# Patient Record
Sex: Female | Born: 1945 | Race: White | Hispanic: No | Marital: Single
Health system: Southern US, Community
[De-identification: ages and names within clinical notes are randomized; demographics above are authoritative.]

## PROBLEM LIST (undated history)

## (undated) DIAGNOSIS — J690 Pneumonitis due to inhalation of food and vomit: Secondary | ICD-10-CM

## (undated) DIAGNOSIS — J9621 Acute and chronic respiratory failure with hypoxia: Secondary | ICD-10-CM

## (undated) DIAGNOSIS — J449 Chronic obstructive pulmonary disease, unspecified: Secondary | ICD-10-CM

## (undated) DIAGNOSIS — I5032 Chronic diastolic (congestive) heart failure: Secondary | ICD-10-CM

## (undated) DIAGNOSIS — I4891 Unspecified atrial fibrillation: Secondary | ICD-10-CM

---

## 2018-10-17 ENCOUNTER — Inpatient Hospital Stay
Admit: 2018-10-17 | Discharge: 2018-11-27 | Disposition: A | Discharge status: Skilled Nursing Facility | Payer: Medicare Other | Source: Other Acute Inpatient Hospital | Attending: Internal Medicine | Admitting: Internal Medicine

## 2018-10-17 DIAGNOSIS — Z931 Gastrostomy status: Secondary | ICD-10-CM

## 2018-10-17 DIAGNOSIS — I5032 Chronic diastolic (congestive) heart failure: Secondary | ICD-10-CM | POA: Diagnosis present

## 2018-10-17 DIAGNOSIS — J9690 Respiratory failure, unspecified, unspecified whether with hypoxia or hypercapnia: Secondary | ICD-10-CM

## 2018-10-17 DIAGNOSIS — J9621 Acute and chronic respiratory failure with hypoxia: Secondary | ICD-10-CM | POA: Diagnosis present

## 2018-10-17 DIAGNOSIS — Z09 Encounter for follow-up examination after completed treatment for conditions other than malignant neoplasm: Secondary | ICD-10-CM

## 2018-10-17 DIAGNOSIS — Z4659 Encounter for fitting and adjustment of other gastrointestinal appliance and device: Secondary | ICD-10-CM

## 2018-10-17 DIAGNOSIS — Z01818 Encounter for other preprocedural examination: Secondary | ICD-10-CM

## 2018-10-17 DIAGNOSIS — Z789 Other specified health status: Secondary | ICD-10-CM

## 2018-10-17 DIAGNOSIS — R092 Respiratory arrest: Secondary | ICD-10-CM

## 2018-10-17 DIAGNOSIS — I4891 Unspecified atrial fibrillation: Secondary | ICD-10-CM | POA: Diagnosis present

## 2018-10-17 DIAGNOSIS — Z9889 Other specified postprocedural states: Secondary | ICD-10-CM

## 2018-10-17 DIAGNOSIS — J189 Pneumonia, unspecified organism: Secondary | ICD-10-CM

## 2018-10-17 DIAGNOSIS — J449 Chronic obstructive pulmonary disease, unspecified: Secondary | ICD-10-CM | POA: Diagnosis present

## 2018-10-17 DIAGNOSIS — J690 Pneumonitis due to inhalation of food and vomit: Secondary | ICD-10-CM | POA: Diagnosis present

## 2018-10-17 HISTORY — DX: Pneumonitis due to inhalation of food and vomit: J69.0

## 2018-10-17 HISTORY — DX: Acute and chronic respiratory failure with hypoxia: J96.21

## 2018-10-17 HISTORY — DX: Chronic diastolic (congestive) heart failure: I50.32

## 2018-10-17 HISTORY — DX: Unspecified atrial fibrillation: I48.91

## 2018-10-17 HISTORY — DX: Chronic obstructive pulmonary disease, unspecified: J44.9

## 2018-10-18 ENCOUNTER — Other Ambulatory Visit (HOSPITAL_COMMUNITY): Payer: Medicare Other

## 2018-10-18 LAB — CBC WITH DIFFERENTIAL/PLATELET
Abs Immature Granulocytes: 0.15 10*3/uL — ABNORMAL HIGH (ref 0.00–0.07)
Basophils Absolute: 0 10*3/uL (ref 0.0–0.1)
Basophils Relative: 0 %
Eosinophils Absolute: 0.3 10*3/uL (ref 0.0–0.5)
Eosinophils Relative: 2 %
HCT: 29.5 % — ABNORMAL LOW (ref 36.0–46.0)
Hemoglobin: 8.1 g/dL — ABNORMAL LOW (ref 12.0–15.0)
Immature Granulocytes: 1 %
Lymphocytes Relative: 11 %
Lymphs Abs: 1.3 10*3/uL (ref 0.7–4.0)
MCH: 24 pg — ABNORMAL LOW (ref 26.0–34.0)
MCHC: 27.5 g/dL — ABNORMAL LOW (ref 30.0–36.0)
MCV: 87.5 fL (ref 80.0–100.0)
Monocytes Absolute: 0.7 10*3/uL (ref 0.1–1.0)
Monocytes Relative: 5 %
Neutro Abs: 9.9 10*3/uL — ABNORMAL HIGH (ref 1.7–7.7)
Neutrophils Relative %: 81 %
Platelets: 444 10*3/uL — ABNORMAL HIGH (ref 150–400)
RBC: 3.37 MIL/uL — ABNORMAL LOW (ref 3.87–5.11)
RDW: 16.4 % — ABNORMAL HIGH (ref 11.5–15.5)
WBC: 12.3 10*3/uL — ABNORMAL HIGH (ref 4.0–10.5)
nRBC: 0 % (ref 0.0–0.2)

## 2018-10-18 LAB — COMPREHENSIVE METABOLIC PANEL
ALT: 11 U/L (ref 0–44)
AST: 18 U/L (ref 15–41)
Albumin: 2 g/dL — ABNORMAL LOW (ref 3.5–5.0)
Alkaline Phosphatase: 97 U/L (ref 38–126)
Anion gap: 8 (ref 5–15)
BUN: 10 mg/dL (ref 8–23)
CO2: 38 mmol/L — ABNORMAL HIGH (ref 22–32)
Calcium: 8.9 mg/dL (ref 8.9–10.3)
Chloride: 96 mmol/L — ABNORMAL LOW (ref 98–111)
Creatinine, Ser: 0.85 mg/dL (ref 0.44–1.00)
GFR calc Af Amer: >60 mL/min (ref >60)
GFR calc non Af Amer: >60 mL/min (ref >60)
Glucose, Bld: 135 mg/dL — ABNORMAL HIGH (ref 70–99)
Potassium: 3.7 mmol/L (ref 3.5–5.1)
Sodium: 142 mmol/L (ref 135–145)
Total Bilirubin: 0.6 mg/dL (ref 0.3–1.2)
Total Protein: 6.3 g/dL — ABNORMAL LOW (ref 6.5–8.1)

## 2018-10-18 LAB — BASIC METABOLIC PANEL
Anion gap: 8 (ref 5–15)
BUN: 12 mg/dL (ref 8–23)
CO2: 37 mmol/L — ABNORMAL HIGH (ref 22–32)
Calcium: 8.9 mg/dL (ref 8.9–10.3)
Chloride: 96 mmol/L — ABNORMAL LOW (ref 98–111)
Creatinine, Ser: 0.75 mg/dL (ref 0.44–1.00)
GFR calc Af Amer: >60 mL/min (ref >60)
GFR calc non Af Amer: >60 mL/min (ref >60)
Glucose, Bld: 135 mg/dL — ABNORMAL HIGH (ref 70–99)
Potassium: 3.4 mmol/L — ABNORMAL LOW (ref 3.5–5.1)
Sodium: 141 mmol/L (ref 135–145)

## 2018-10-18 LAB — BLOOD GAS, ARTERIAL
Acid-Base Excess: 11.1 mmol/L — ABNORMAL HIGH (ref 0.0–2.0)
Acid-Base Excess: 12.4 mmol/L — ABNORMAL HIGH (ref 0.0–2.0)
Bicarbonate: 37.3 mmol/L — ABNORMAL HIGH (ref 20.0–28.0)
Bicarbonate: 38.5 mmol/L — ABNORMAL HIGH (ref 20.0–28.0)
Delivery systems: POSITIVE
Delivery systems: POSITIVE
Expiratory PAP: 5
Expiratory PAP: 8
FIO2: 60
FIO2: 60
Inspiratory PAP: 14
Inspiratory PAP: 22
O2 Saturation: 95.1 %
O2 Saturation: 96.6 %
Patient temperature: 96.8
Patient temperature: 97.1
RATE: 16 resp/min
pCO2 arterial: 69.2 mmHg (ref 32.0–48.0)
pCO2 arterial: 70.1 mmHg (ref 32.0–48.0)
pH, Arterial: 7.34 — ABNORMAL LOW (ref 7.350–7.450)
pH, Arterial: 7.358 (ref 7.350–7.450)
pO2, Arterial: 71.6 mmHg — ABNORMAL LOW (ref 83.0–108.0)
pO2, Arterial: 89.4 mmHg (ref 83.0–108.0)

## 2018-10-18 LAB — URINALYSIS, ROUTINE W REFLEX MICROSCOPIC
Bilirubin Urine: NEGATIVE
Glucose, UA: NEGATIVE mg/dL
Hgb urine dipstick: NEGATIVE
Ketones, ur: 5 mg/dL — AB
Leukocytes,Ua: NEGATIVE
Nitrite: NEGATIVE
Protein, ur: 100 mg/dL — AB
Specific Gravity, Urine: 1.017 (ref 1.005–1.030)
pH: 6 (ref 5.0–8.0)

## 2018-10-18 LAB — PROTIME-INR
INR: 1.1 (ref 0.8–1.2)
Prothrombin Time: 14.2 seconds (ref 11.4–15.2)

## 2018-10-18 LAB — MAGNESIUM: Magnesium: 1.7 mg/dL (ref 1.7–2.4)

## 2018-10-19 ENCOUNTER — Other Ambulatory Visit (HOSPITAL_COMMUNITY): Payer: Medicare Other

## 2018-10-19 LAB — BLOOD GAS, ARTERIAL
Acid-Base Excess: 12.4 mmol/L — ABNORMAL HIGH (ref 0.0–2.0)
Bicarbonate: 38.2 mmol/L — ABNORMAL HIGH (ref 20.0–28.0)
Delivery systems: POSITIVE
Expiratory PAP: 8
FIO2: 60
Inspiratory PAP: 18
O2 Saturation: 93.2 %
Patient temperature: 98.6
pCO2 arterial: 69.5 mmHg (ref 32.0–48.0)
pH, Arterial: 7.359 (ref 7.350–7.450)
pO2, Arterial: 68.8 mmHg — ABNORMAL LOW (ref 83.0–108.0)

## 2018-10-19 LAB — MAGNESIUM: Magnesium: 2.1 mg/dL (ref 1.7–2.4)

## 2018-10-19 LAB — POTASSIUM: Potassium: 3.5 mmol/L (ref 3.5–5.1)

## 2018-10-20 ENCOUNTER — Other Ambulatory Visit (HOSPITAL_COMMUNITY): Payer: Medicare Other

## 2018-10-20 DIAGNOSIS — J9621 Acute and chronic respiratory failure with hypoxia: Secondary | ICD-10-CM

## 2018-10-20 DIAGNOSIS — I4891 Unspecified atrial fibrillation: Secondary | ICD-10-CM

## 2018-10-20 DIAGNOSIS — J690 Pneumonitis due to inhalation of food and vomit: Secondary | ICD-10-CM

## 2018-10-20 DIAGNOSIS — I5032 Chronic diastolic (congestive) heart failure: Secondary | ICD-10-CM

## 2018-10-20 DIAGNOSIS — J449 Chronic obstructive pulmonary disease, unspecified: Secondary | ICD-10-CM

## 2018-10-20 LAB — BLOOD GAS, ARTERIAL
Acid-Base Excess: 17.5 mmol/L — ABNORMAL HIGH (ref 0.0–2.0)
Bicarbonate: 43.8 mmol/L — ABNORMAL HIGH (ref 20.0–28.0)
Delivery systems: POSITIVE
Expiratory PAP: 8
FIO2: 70
Inspiratory PAP: 18
O2 Saturation: 95.6 %
Patient temperature: 98.6
pCO2 arterial: 78.1 mmHg (ref 32.0–48.0)
pH, Arterial: 7.367 (ref 7.350–7.450)
pO2, Arterial: 79.6 mmHg — ABNORMAL LOW (ref 83.0–108.0)

## 2018-10-20 LAB — URINE CULTURE: Culture: NO GROWTH

## 2018-10-20 LAB — TSH: TSH: 6.82 u[IU]/mL — ABNORMAL HIGH (ref 0.350–4.500)

## 2018-10-20 NOTE — Consult Note (Signed)
Pulmonary Critical Care Medicine [facility] [place]  PULMONARY SERVICE  Date of Service: 10/20/2018  PULMONARY CRITICAL CARE CONSULT   Shannon Lang  XBD:[id number]  DOB: [birth year]   DOA: 10/17/2018  Referring Physician: Carron Curie, MD  HPI: Shannon Lang is a 73 y.o. female seen for follow up of Acute on Chronic Respiratory Failure.  Patient has multiple medical problems is a nursing home resident with a history of severe COPD chronic atrial fibrillation CHF schizophrenia hypothyroidism type 2 diabetes presented to the hospital with increasing shortness of breath and apparently patient had an aspiration event which led to respiratory failure was initially tried on BiPAP did not tolerate and ended up being intubated on the ventilator.  Patient was found to have bilateral airspace disease was subsequently ventilated treated with vancomycin as well as meropenem and then was extubated.  Patient subsequently has had some difficulty requiring BiPAP and significant hypoxia.  Review of Systems:  ROS performed and is unremarkable other than noted above.  Past medical history: Severe COPD Chronic atrial fibrillation CHF Schizophrenia Thyroid underactivity Diabetes type 2 Aspiration pneumonia Chronic nursing home resident  Past surgical history: Unknown  Family History: Non-Contributory to the present illness  Allergies not on file  Medications: Reviewed on Rounds  Physical Exam:  Vitals: Temperature 98.7 pulse 84 respiratory rate 23 blood pressure 159/88 saturations 94%  Ventilator Settings patient was on BiPAP 18/8 and FiO2 was 70%.  Tidal volumes were recorded at 429 cc  . General: Comfortable at this time . Eyes: Grossly normal lids, irises & conjunctiva . ENT: grossly tongue is normal . Neck: no obvious mass . Cardiovascular: S1-S2 normal no gallop or rub is noted . Respiratory: Coarse breath sounds noted bilaterally . Abdomen: Soft nontender . Skin: no  rash seen on limited exam . Musculoskeletal: not rigid . Psychiatric:unable to assess . Neurologic: no seizure no involuntary movements         Labs on Admission:  Basic Metabolic Panel: Recent Labs  Lab 10/18/18 0351 10/18/18 2059 10/19/18 0707 10/19/18 1038  NA 142 141  --   --   K 3.7 3.4*  --  3.5  CL 96* 96*  --   --   CO2 38* 37*  --   --   GLUCOSE 135* 135*  --   --   BUN 10 12  --   --   CREATININE 0.85 0.75  --   --   CALCIUM 8.9 8.9  --   --   MG 1.7  --  2.1  --     Recent Labs  Lab 10/18/18 0012 10/18/18 0835 10/19/18 0500  PHART 7.340* 7.358 7.359  PCO2ART 70.1* 69.2* 69.5*  PO2ART 89.4 71.6* 68.8*  HCO3 37.3* 38.5* 38.2*  O2SAT 96.6 95.1 93.2    Liver Function Tests: Recent Labs  Lab 10/18/18 0351  AST 18  ALT 11  ALKPHOS 97  BILITOT 0.6  PROT 6.3*  ALBUMIN 2.0*   No results for input(s): LIPASE, AMYLASE in the last 168 hours. No results for input(s): AMMONIA in the last 168 hours.  CBC: Recent Labs  Lab 10/18/18 0351  WBC 12.3*  NEUTROABS 9.9*  HGB 8.1*  HCT 29.5*  MCV 87.5  PLT 444*    Cardiac Enzymes: No results for input(s): CKTOTAL, CKMB, CKMBINDEX, TROPONINI in the last 168 hours.  BNP (last 3 results) No results for input(s): BNP in the last 8760 hours.  ProBNP (last 3 results) No results for input(s): PROBNP in  the last 8760 hours.   Radiological Exams on Admission: Dg Chest Port 1 View  Result Date: 10/20/2018 CLINICAL DATA:  Respiratory failure.  Shortness of breath. EXAM: PORTABLE CHEST 1 VIEW COMPARISON:  One-view chest x-ray 10/19/2018 FINDINGS: The heart is enlarged. A mild diffuse interstitial pattern is again seen. Bibasilar airspace disease demonstrate some progression. Bilateral effusions are noted. IMPRESSION: 1. Low lung volumes with increasing bibasilar airspace disease, right greater than left. 2. Cardiomegaly. Electronically Signed   By: Marin Robertshristopher  Mattern M.D.   On: 10/20/2018 07:52   Dg Chest Port 1  View  Result Date: 10/19/2018 CLINICAL DATA:  Respiratory failure requiring CPAP. Acute shortness of breath. Follow-up pulmonary edema versus pneumonia. EXAM: PORTABLE CHEST 1 VIEW COMPARISON:  10/18/2018. FINDINGS: Cardiac silhouette moderately enlarged. Mild interstitial and airspace opacities throughout both lungs and small BILATERAL pleural effusions, unchanged since yesterday. No new pulmonary parenchymal abnormalities. LEFT arm PICC tip projects at the expected location of the LEFT innominate vein, unchanged. IMPRESSION: 1. Support apparatus satisfactory. 2. Stable mild CHF and/or fluid overload versus pneumonia (CHF/fluid overload is favored). 3. No new abnormalities. Electronically Signed   By: Hulan Saashomas  Lawrence M.D.   On: 10/19/2018 08:00   Dg Chest Port 1 View  Result Date: 10/18/2018 CLINICAL DATA:  Respiratory failure EXAM: PORTABLE CHEST 1 VIEW COMPARISON:  None. FINDINGS: Left PICC terminates in the left brachiocephalic vein to the right of midline. Mild cardiomegaly. Aortic arch atherosclerosis. Mediastinal contour otherwise within normal limits. No pneumothorax. Small bilateral pleural effusions, right greater than left. Diffuse hazy parahilar lung opacities. IMPRESSION: 1. Mild cardiomegaly. Diffuse hazy parahilar lung opacities, consider pulmonary edema or atypical infection. 2. Small bilateral pleural effusions, right greater than left. 3. Left PICC terminates in the left brachiocephalic vein to the right of midline. Electronically Signed   By: Delbert PhenixJason A Poff M.D.   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 Acute on chronic respiratory failure with hypoxia patient still has significant abnormality noted on the  chest x-ray fluid overload versus pneumonia where his CHF was being favored according to the radiology interpretation.  Patient has been treated for aspiration we will discussed with the primary care team. 2. Aspiration pneumonia patient has received antibiotics still however is significantly hypoxic and chest x-ray showing infiltrates more consistent with heart failure than pneumonia.  Would recommend monitoring fluid status and diuretics. 3. Atrial fibrillation with RVR right now the rate is controlled we will continue with supportive care. 4. Chronic congestive heart failure diastolic may have acute decompensation with diuresis tolerated monitor the fluid status closely.  Follow-up x-rays 5. Severe COPD nebulizers as necessary we will continue with present management  I have personally seen and evaluated the patient, evaluated laboratory and imaging results, formulated the assessment and plan and placed orders. The Patient requires high complexity decision making for assessment and support.  Case was discussed on Rounds with the Respiratory Therapy Staff Time Spent 70minutes  Yevonne PaxSaadat A Danijela Vessey, MD [facility] Pulmonary Critical Care Medicine Sleep Medicine

## 2018-10-20 NOTE — Consult Note (Signed)
Referring Physician:  Jlynn Hengst is an 73 y.o. female.                       Chief Complaint: Sinus bradycardia  HPI: 73 years old female with PMH of CHF, Acute on chronic respiratory failure, COPD, type 2 DM, hypothyroidism, morbid obesity and Schizophrenia, pneumonia and anemia is admitted with acute on chronic respiratory failure. Her heart rate varies from 40's to 60's. Patient is sedated and on BiPAP. Chest x-ray is suggestive of CHF.  No past medical history on file.    The histories are not reviewed yet. Please review them in the "History" navigator section and refresh this SmartLink.  No family history on file. Social History:  has no history on file for tobacco, alcohol, and drug.  Allergies: Allergies not on file  No medications prior to admission.    Results for orders placed or performed during the hospital encounter of 10/17/18 (from the past 48 hour(s))  Urinalysis, Routine w reflex microscopic     Status: Abnormal   Collection Time: 10/18/18  5:10 PM  Result Value Ref Range   Color, Urine YELLOW YELLOW   APPearance HAZY (A) CLEAR   Specific Gravity, Urine 1.017 1.005 - 1.030   pH 6.0 5.0 - 8.0   Glucose, UA NEGATIVE NEGATIVE mg/dL   Hgb urine dipstick NEGATIVE NEGATIVE   Bilirubin Urine NEGATIVE NEGATIVE   Ketones, ur 5 (A) NEGATIVE mg/dL   Protein, ur 509 (A) NEGATIVE mg/dL   Nitrite NEGATIVE NEGATIVE   Leukocytes,Ua NEGATIVE NEGATIVE   RBC / HPF 0-5 0 - 5 RBC/hpf   WBC, UA 6-10 0 - 5 WBC/hpf   Bacteria, UA RARE (A) NONE SEEN   Squamous Epithelial / LPF 0-5 0 - 5   Mucus PRESENT    Hyaline Casts, UA PRESENT     Comment: Performed at [facility], [address], [place], [place] [address]  Basic metabolic panel     Status: Abnormal   Collection Time: 10/18/18  8:59 PM  Result Value Ref Range   Sodium 141 135 - 145 mmol/L   Potassium 3.4 (L) 3.5 - 5.1 mmol/L   Chloride 96 (L) 98 - 111 mmol/L   CO2 37 (H) 22 - 32 mmol/L   Glucose, Bld 135 (H)  70 - 99 mg/dL   BUN 12 8 - 23 mg/dL   Creatinine, Ser 2.45 0.44 - 1.00 mg/dL   Calcium 8.9 8.9 - 80.9 mg/dL   GFR calc non Af Amer >60 >60 mL/min   GFR calc Af Amer >60 >60 mL/min   Anion gap 8 5 - 15    Comment: Performed at [facility], [address] [place], [place] [address]  Blood gas, arterial     Status: Abnormal   Collection Time: 10/19/18  5:00 AM  Result Value Ref Range   FIO2 60.00    Delivery systems BILEVEL POSITIVE AIRWAY PRESSURE    Inspiratory PAP 18.0    Expiratory PAP 8.0    pH, Arterial 7.359 7.350 - 7.450   pCO2 arterial 69.5 (HH) 32.0 - 48.0 mmHg    Comment: CRITICAL RESULT CALLED TO, READ BACK BY AND VERIFIED WITH: STEVE Ruane,RRT,RCP AT 0513 BY KATEY FARGIS, RRT,RCP ON 10/19/2018    pO2, Arterial 68.8 (L) 83.0 - 108.0 mmHg   Bicarbonate 38.2 (H) 20.0 - 28.0 mmol/L   Acid-Base Excess 12.4 (H) 0.0 - 2.0 mmol/L   O2 Saturation 93.2 %  Patient temperature 98.6    Collection site RIGHT RADIAL    Drawn by COLLECTED BY RT    Sample type ARTERIAL DRAW    Allens test (pass/fail) PASS PASS  Magnesium     Status: None   Collection Time: 10/19/18  7:07 AM  Result Value Ref Range   Magnesium 2.1 1.7 - 2.4 mg/dL    Comment: Performed at [facility], [address] [place], [place] [address]  Potassium     Status: None   Collection Time: 10/19/18 10:38 AM  Result Value Ref Range   Potassium 3.5 3.5 - 5.1 mmol/L    Comment: Performed at [facility], [address] [place], [place] [address]  Blood gas, arterial     Status: Abnormal   Collection Time: 10/20/18 10:35 AM  Result Value Ref Range   FIO2 70.00    Delivery systems BILEVEL POSITIVE AIRWAY PRESSURE    Inspiratory PAP 18.0    Expiratory PAP 8.0    pH, Arterial 7.367 7.350 - 7.450   pCO2 arterial 78.1 (HH) 32.0 - 48.0 mmHg    Comment: CRITICAL RESULT CALLED TO, READ BACK BY AND VERIFIED WITH: Lindon Romp, RRT AT 1043 BY BRANDY BAILEY, RRT ON 10/20/2018    pO2,  Arterial 79.6 (L) 83.0 - 108.0 mmHg   Bicarbonate 43.8 (H) 20.0 - 28.0 mmol/L   Acid-Base Excess 17.5 (H) 0.0 - 2.0 mmol/L   O2 Saturation 95.6 %   Patient temperature 98.6    Collection site RIGHT RADIAL    Drawn by COLLECTED BY RT    Sample type ARTERIAL DRAW    Allens test (pass/fail) PASS PASS   Dg Chest Port 1 View  Result Date: 10/20/2018 CLINICAL DATA:  Respiratory failure.  Shortness of breath. EXAM: PORTABLE CHEST 1 VIEW COMPARISON:  One-view chest x-ray 10/19/2018 FINDINGS: The heart is enlarged. A mild diffuse interstitial pattern is again seen. Bibasilar airspace disease demonstrate some progression. Bilateral effusions are noted. IMPRESSION: 1. Low lung volumes with increasing bibasilar airspace disease, right greater than left. 2. Cardiomegaly. Electronically Signed   By: Marin Roberts M.D.   On: 10/20/2018 07:52   Dg Chest Port 1 View  Result Date: 10/19/2018 CLINICAL DATA:  Respiratory failure requiring CPAP. Acute shortness of breath. Follow-up pulmonary edema versus pneumonia. EXAM: PORTABLE CHEST 1 VIEW COMPARISON:  10/18/2018. FINDINGS: Cardiac silhouette moderately enlarged. Mild interstitial and airspace opacities throughout both lungs and small BILATERAL pleural effusions, unchanged since yesterday. No new pulmonary parenchymal abnormalities. LEFT arm PICC tip projects at the expected location of the LEFT innominate vein, unchanged. IMPRESSION: 1. Support apparatus satisfactory. 2. Stable mild CHF and/or fluid overload versus pneumonia (CHF/fluid overload is favored). 3. No new abnormalities. Electronically Signed   By: Hulan Saas M.D.   On: 10/19/2018 08:00    Review Of Systems Unable to obtain and as per PMH.   There were no vitals taken for this visit. There is no height or weight on file to calculate BMI. General appearance: alert, cooperative, appears stated age and no distress Head: Normocephalic, atraumatic. Eyes: Hazel eyes, pale conjunctiva,  corneas clear.  Neck: No adenopathy, no carotid bruit, Positive JVD, supple, symmetrical, trachea midline and thyroid not enlarged. Resp: Crackles at bases to auscultation bilaterally. Cardio: Bradycardic, Irregular rate and rhythm, S1, S2 normal, II/VI systolic murmur, no click, rub or gallop GI: Soft, non-tender; bowel sounds normal; no organomegaly. Extremities: 2 + edema, no cyanosis or clubbing. Skin: Warm and dry.  Neurologic: Alert and oriented X 0.  Assessment/Plan Sinus bradycardia Acute on chronic respiratory failure with hypoxemia and hypercapnia Acute on chronic left heart systolic failure Anemia of chronic disease Hypothyroidism Morbid obesity COPD Schizophrenia  Avoid Beta blocker and diltiazem. Continue respiratory support Check TSH. Iron supplement. Bradycardia appears stable for now   Ricki RodriguezAjay S Reeya Bound, MD  10/20/2018, 4:30 PM

## 2018-10-21 ENCOUNTER — Encounter: Payer: Self-pay | Admitting: Internal Medicine

## 2018-10-21 DIAGNOSIS — I5032 Chronic diastolic (congestive) heart failure: Secondary | ICD-10-CM | POA: Diagnosis present

## 2018-10-21 DIAGNOSIS — J690 Pneumonitis due to inhalation of food and vomit: Secondary | ICD-10-CM | POA: Diagnosis present

## 2018-10-21 DIAGNOSIS — J9621 Acute and chronic respiratory failure with hypoxia: Secondary | ICD-10-CM | POA: Diagnosis not present

## 2018-10-21 DIAGNOSIS — I4891 Unspecified atrial fibrillation: Secondary | ICD-10-CM | POA: Diagnosis present

## 2018-10-21 DIAGNOSIS — J449 Chronic obstructive pulmonary disease, unspecified: Secondary | ICD-10-CM | POA: Diagnosis present

## 2018-10-21 LAB — BLOOD GAS, ARTERIAL
Acid-Base Excess: 21.7 mmol/L — ABNORMAL HIGH (ref 0.0–2.0)
Acid-Base Excess: 21.9 mmol/L — ABNORMAL HIGH (ref 0.0–2.0)
Bicarbonate: 47.9 mmol/L — ABNORMAL HIGH (ref 20.0–28.0)
Bicarbonate: 49 mmol/L — ABNORMAL HIGH (ref 20.0–28.0)
Delivery systems: POSITIVE
Expiratory PAP: 8
FIO2: 70
FIO2: 100
Inspiratory PAP: 18
O2 Content: 35 L/min
O2 Saturation: 92.5 %
O2 Saturation: 98.2 %
Patient temperature: 98.6
Patient temperature: 98.6
pCO2 arterial: 76.8 mmHg (ref 32.0–48.0)
pCO2 arterial: 94 mmHg (ref 32.0–48.0)
pH, Arterial: 7.412 (ref 7.350–7.450)
pH, Arterial: 7.337 — ABNORMAL LOW (ref 7.350–7.450)
pO2, Arterial: 65 mmHg — ABNORMAL LOW (ref 83.0–108.0)
pO2, Arterial: 119 mmHg — ABNORMAL HIGH (ref 83.0–108.0)

## 2018-10-21 LAB — BASIC METABOLIC PANEL
Anion gap: 8 (ref 5–15)
BUN: 10 mg/dL (ref 8–23)
CO2: 46 mmol/L — ABNORMAL HIGH (ref 22–32)
Calcium: 8.5 mg/dL — ABNORMAL LOW (ref 8.9–10.3)
Chloride: 88 mmol/L — ABNORMAL LOW (ref 98–111)
Creatinine, Ser: 0.68 mg/dL (ref 0.44–1.00)
GFR calc Af Amer: >60 mL/min (ref >60)
GFR calc non Af Amer: >60 mL/min (ref >60)
Glucose, Bld: 228 mg/dL — ABNORMAL HIGH (ref 70–99)
Potassium: 2.7 mmol/L — CL (ref 3.5–5.1)
Sodium: 142 mmol/L (ref 135–145)

## 2018-10-21 LAB — MAGNESIUM: Magnesium: 1.5 mg/dL — ABNORMAL LOW (ref 1.7–2.4)

## 2018-10-21 LAB — TRIGLYCERIDES: Triglycerides: 97 mg/dL (ref <150)

## 2018-10-21 LAB — PHOSPHORUS: Phosphorus: 2.9 mg/dL (ref 2.5–4.6)

## 2018-10-21 NOTE — Consult Note (Signed)
Ref: System, Pcp Not In   Subjective:  Opens eyes but not talking. Heart rate improving. Low potassium noted. TSH marginally high. Good oxygen saturations with high flow oxygen.  Objective:  Vital Signs in the last 24 hours: BP: ()/()  Arterial Line BP: ()/()   Physical Exam: BP Readings from Last 1 Encounters:  No data found for BP     Wt Readings from Last 1 Encounters:  No data found for Wt    Weight change:  There is no height or weight on file to calculate BMI. HEENT: [place]/AT, Eyes- Hazel eyes, Conjunctiva-Pale, Sclera-Non-icteric Neck: No JVD, No bruit, Trachea midline. Lungs:  Clearing, Bilateral. Cardiac:  Regular rhythm, normal S1 and S2, no S3. II/VI systolic murmur. Abdomen:  Soft, non-tender. BS present. Extremities:  1 + edema present. No cyanosis. No clubbing. CNS: AxOx0, Cranial nerves grossly intact.  Skin: Warm and dry.   Intake/Output from previous day: No intake/output data recorded.    Lab Results: BMET    Component Value Date/Time   NA 142 10/21/2018 0750   NA 141 10/18/2018 2059   NA 142 10/18/2018 0351   K 2.7 (LL) 10/21/2018 0750   K 3.5 10/19/2018 1038   K 3.4 (L) 10/18/2018 2059   CL 88 (L) 10/21/2018 0750   CL 96 (L) 10/18/2018 2059   CL 96 (L) 10/18/2018 0351   CO2 46 (H) 10/21/2018 0750   CO2 37 (H) 10/18/2018 2059   CO2 38 (H) 10/18/2018 0351   GLUCOSE 228 (H) 10/21/2018 0750   GLUCOSE 135 (H) 10/18/2018 2059   GLUCOSE 135 (H) 10/18/2018 0351   BUN 10 10/21/2018 0750   BUN 12 10/18/2018 2059   BUN 10 10/18/2018 0351   CREATININE 0.68 10/21/2018 0750   CREATININE 0.75 10/18/2018 2059   CREATININE 0.85 10/18/2018 0351   CALCIUM 8.5 (L) 10/21/2018 0750   CALCIUM 8.9 10/18/2018 2059   CALCIUM 8.9 10/18/2018 0351   GFRNONAA >60 10/21/2018 0750   GFRNONAA >60 10/18/2018 2059   GFRNONAA >60 10/18/2018 0351   GFRAA >60 10/21/2018 0750   GFRAA >60 10/18/2018 2059   GFRAA >60 10/18/2018 0351   CBC    Component Value Date/Time   WBC 12.3 (H) 10/18/2018 0351   RBC 3.37 (L) 10/18/2018 0351   HGB 8.1 (L) 10/18/2018 0351   HCT 29.5 (L) 10/18/2018 0351   PLT 444 (H) 10/18/2018 0351   MCV 87.5 10/18/2018 0351   MCH 24.0 (L) 10/18/2018 0351   MCHC 27.5 (L) 10/18/2018 0351   RDW 16.4 (H) 10/18/2018 0351   LYMPHSABS 1.3 10/18/2018 0351   MONOABS 0.7 10/18/2018 0351   EOSABS 0.3 10/18/2018 0351   BASOSABS 0.0 10/18/2018 0351   HEPATIC Function Panel Recent Labs    10/18/18 0351  PROT 6.3*   HEMOGLOBIN A1C No components found for: HGA1C,  MPG CARDIAC ENZYMES No results found for: CKTOTAL, CKMB, CKMBINDEX, TROPONINI BNP No results for input(s): PROBNP in the last 8760 hours. TSH Recent Labs    10/20/18 1816  TSH 6.820*   CHOLESTEROL No results for input(s): CHOL in the last 8760 hours.  Scheduled Meds: Continuous Infusions: PRN Meds:.  Assessment/Plan: Sinus bradycardia, improved Acute on chronic respiratory failure with hypoxemia and hypercapnia Acute on chronic left heart systolic failure Anemia of chronic disease Hypothyroidism Morbid obesity COPD Schizophrenia Hypokalemia  Continue medical therapy. Potassium replacement.  Thyroid dose adjustment.    LOS: 0 days    Orpah Cobb  MD  10/21/2018, 7:52 PM

## 2018-10-21 NOTE — Progress Notes (Addendum)
[facility] Critical [facility] [place]   PULMONARY CRITICAL CARE SERVICE  PROGRESS NOTE  Date of Service: 10/21/2018  Shannon Lang  ZOX:[id number]  DOB: 07/10/1945   DOA: 10/17/2018  Referring Physician: Carron CurieAli Hijazi, MD  HPI: Shannon NianSheila Naab is a 73 y.o. female seen for follow up of Acute on Chronic Respiratory Failure.  Patient currently heated high flow at 35 L 100% FiO2.  She rest all night.  It is of note that she has some ischemic down the bridge of her nose from her BiPAP.  Today she appears to be doing well with good saturations.  Medications: Reviewed on Rounds  Physical Exam:  Vitals: Pulse 84 respirations 25 BP 159/88 O2 sat 90% temp 6.4  Ventilator Settings currently on heated high flow 35 L 100% FiO2  . General: Comfortable at this time . Eyes: Grossly normal lids, irises & conjunctiva . ENT: grossly tongue is normal . Neck: no obvious mass . Cardiovascular: S1 S2 normal no gallop . Respiratory: No rales or rhonchi noted . Abdomen: soft . Skin: no rash seen on limited exam . Musculoskeletal: not rigid . Psychiatric:unable to assess . Neurologic: no seizure no involuntary movements         Lab Data:   Basic Metabolic Panel: Recent Labs  Lab 10/18/18 0351 10/18/18 2059 10/19/18 0707 10/19/18 1038 10/21/18 0750  NA 142 141  --   --  142  K 3.7 3.4*  --  3.5 2.7*  CL 96* 96*  --   --  88*  CO2 38* 37*  --   --  46*  GLUCOSE 135* 135*  --   --  228*  BUN 10 12  --   --  10  CREATININE 0.85 0.75  --   --  0.68  CALCIUM 8.9 8.9  --   --  8.5*  MG 1.7  --  2.1  --  1.5*  PHOS  --   --   --   --  2.9    ABG: Recent Labs  Lab 10/18/18 0835 10/19/18 0500 10/20/18 1035 10/21/18 0315 10/21/18 1142  PHART 7.358 7.359 7.367 7.412 7.337*  PCO2ART 69.2* 69.5* 78.1* 76.8* 94.0*  PO2ART 71.6* 68.8* 79.6* 65.0* 119*  HCO3 38.5* 38.2* 43.8* 47.9* 49.0*  O2SAT 95.1 93.2 95.6 92.5 98.2    Liver Function Tests: Recent Labs  Lab  10/18/18 0351  AST 18  ALT 11  ALKPHOS 97  BILITOT 0.6  PROT 6.3*  ALBUMIN 2.0*   No results for input(s): LIPASE, AMYLASE in the last 168 hours. No results for input(s): AMMONIA in the last 168 hours.  CBC: Recent Labs  Lab 10/18/18 0351  WBC 12.3*  NEUTROABS 9.9*  HGB 8.1*  HCT 29.5*  MCV 87.5  PLT 444*    Cardiac Enzymes: No results for input(s): CKTOTAL, CKMB, CKMBINDEX, TROPONINI in the last 168 hours.  BNP (last 3 results) No results for input(s): BNP in the last 8760 hours.  ProBNP (last 3 results) No results for input(s): PROBNP in the last 8760 hours.  Radiological Exams: Dg Chest Port 1 View  Result Date: 10/20/2018 CLINICAL DATA:  Respiratory failure.  Shortness of breath. EXAM: PORTABLE CHEST 1 VIEW COMPARISON:  One-view chest x-ray 10/19/2018 FINDINGS: The heart is enlarged. A mild diffuse interstitial pattern is again seen. Bibasilar airspace disease demonstrate some progression. Bilateral effusions are noted. IMPRESSION: 1. Low lung volumes with increasing bibasilar airspace disease, right greater than left. 2. Cardiomegaly. Electronically Signed  By: Marin Roberts M.D.   On: 10/20/2018 07:52    Assessment/Plan Active Problems:   Acute on chronic respiratory failure with hypoxia (HCC)   Aspiration pneumonia due to gastric secretions (HCC)   Atrial fibrillation with RVR (HCC)   Chronic congestive heart failure with left ventricular diastolic dysfunction (HCC)   COPD, severe (HCC)   1. Acute on chronic respiratory failure with hypoxia patient continues to require heated high flow nasal cannula during the day 35 L 100% FiO2.  She is using BiPAP at night.  Continue pulmonary toilet and secretion management. 2. Aspiration pneumonia patient's chest x-ray continues to show infiltrates and she has been treated for pneumonia at this time. 3. Atrial fibrillation with RVR rate controlled 4. Chronic congestive heart failure diastolic continue to  monitor 5. Severe COPD continue nebulizers as necessary.   I have personally seen and evaluated the patient, evaluated laboratory and imaging results, formulated the assessment and plan and placed orders. The Patient requires high complexity decision making for assessment and support.  Case was discussed on Rounds with the Respiratory Therapy Staff  Yevonne Pax, MD [facility] Pulmonary Critical Care Medicine Sleep Medicine

## 2018-10-22 DIAGNOSIS — I4891 Unspecified atrial fibrillation: Secondary | ICD-10-CM | POA: Diagnosis not present

## 2018-10-22 DIAGNOSIS — J690 Pneumonitis due to inhalation of food and vomit: Secondary | ICD-10-CM | POA: Diagnosis not present

## 2018-10-22 DIAGNOSIS — J9621 Acute and chronic respiratory failure with hypoxia: Secondary | ICD-10-CM | POA: Diagnosis not present

## 2018-10-22 DIAGNOSIS — I5032 Chronic diastolic (congestive) heart failure: Secondary | ICD-10-CM | POA: Diagnosis not present

## 2018-10-22 LAB — BLOOD GAS, ARTERIAL
Acid-Base Excess: 20.4 mmol/L — ABNORMAL HIGH (ref 0.0–2.0)
Bicarbonate: 47.2 mmol/L — ABNORMAL HIGH (ref 20.0–28.0)
Delivery systems: POSITIVE
Expiratory PAP: 8
FIO2: 80
Inspiratory PAP: 18
O2 Saturation: 96.3 %
Patient temperature: 98.6
pCO2 arterial: 89.7 mmHg (ref 32.0–48.0)
pH, Arterial: 7.341 — ABNORMAL LOW (ref 7.350–7.450)
pO2, Arterial: 86.6 mmHg (ref 83.0–108.0)

## 2018-10-22 LAB — POTASSIUM: Potassium: 3.4 mmol/L — ABNORMAL LOW (ref 3.5–5.1)

## 2018-10-22 LAB — MAGNESIUM: Magnesium: 1.8 mg/dL (ref 1.7–2.4)

## 2018-10-22 NOTE — Consult Note (Signed)
Ref: System, Pcp Not In   Subjective:  Resting comfortably. On high flow oxygen.  Objective:  P: 59, R-30 BP-130/64 O2 sat 97 % on 60 % FiO2. BP: ()/()  Arterial Line BP: ()/()   Physical Exam: BP Readings from Last 1 Encounters:  No data found for BP     Wt Readings from Last 1 Encounters:  No data found for Wt    Weight change:  There is no height or weight on file to calculate BMI. HEENT: [place]/AT, Eyes-Hazel, Conjunctiva-Pale, Sclera-Non-icteric Neck: No JVD, No bruit, Trachea midline. Lungs:  Clearing, Bilateral. Cardiac:  Regular rhythm, normal S1 and S2, no S3. II/VI systolic murmur. Abdomen:  Soft, non-tender. BS present. Extremities:  1+ edema present. No cyanosis. No clubbing. CNS: AxOx0, Cranial nerves grossly intact.  Skin: Warm and dry.   Intake/Output from previous day: No intake/output data recorded.    Lab Results: BMET    Component Value Date/Time   NA 142 10/21/2018 0750   NA 141 10/18/2018 2059   NA 142 10/18/2018 0351   K 3.4 (L) 10/22/2018 0645   K 2.7 (LL) 10/21/2018 0750   K 3.5 10/19/2018 1038   CL 88 (L) 10/21/2018 0750   CL 96 (L) 10/18/2018 2059   CL 96 (L) 10/18/2018 0351   CO2 46 (H) 10/21/2018 0750   CO2 37 (H) 10/18/2018 2059   CO2 38 (H) 10/18/2018 0351   GLUCOSE 228 (H) 10/21/2018 0750   GLUCOSE 135 (H) 10/18/2018 2059   GLUCOSE 135 (H) 10/18/2018 0351   BUN 10 10/21/2018 0750   BUN 12 10/18/2018 2059   BUN 10 10/18/2018 0351   CREATININE 0.68 10/21/2018 0750   CREATININE 0.75 10/18/2018 2059   CREATININE 0.85 10/18/2018 0351   CALCIUM 8.5 (L) 10/21/2018 0750   CALCIUM 8.9 10/18/2018 2059   CALCIUM 8.9 10/18/2018 0351   GFRNONAA >60 10/21/2018 0750   GFRNONAA >60 10/18/2018 2059   GFRNONAA >60 10/18/2018 0351   GFRAA >60 10/21/2018 0750   GFRAA >60 10/18/2018 2059   GFRAA >60 10/18/2018 0351   CBC    Component Value Date/Time   WBC 12.3 (H) 10/18/2018 0351   RBC 3.37 (L) 10/18/2018 0351   HGB 8.1 (L) 10/18/2018  0351   HCT 29.5 (L) 10/18/2018 0351   PLT 444 (H) 10/18/2018 0351   MCV 87.5 10/18/2018 0351   MCH 24.0 (L) 10/18/2018 0351   MCHC 27.5 (L) 10/18/2018 0351   RDW 16.4 (H) 10/18/2018 0351   LYMPHSABS 1.3 10/18/2018 0351   MONOABS 0.7 10/18/2018 0351   EOSABS 0.3 10/18/2018 0351   BASOSABS 0.0 10/18/2018 0351   HEPATIC Function Panel Recent Labs    10/18/18 0351  PROT 6.3*   HEMOGLOBIN A1C No components found for: HGA1C,  MPG CARDIAC ENZYMES No results found for: CKTOTAL, CKMB, CKMBINDEX, TROPONINI BNP No results for input(s): PROBNP in the last 8760 hours. TSH Recent Labs    10/20/18 1816  TSH 6.820*   CHOLESTEROL No results for input(s): CHOL in the last 8760 hours.  Scheduled Meds: Continuous Infusions: PRN Meds:.  Assessment/Plan: Sinus bradycardia Acute on chronic respiratory failure Acute on chronic left heart systolic and diastolic failure Anemia of chronic disease Hypothyroidism Morbid obesity COPD Schizophrenia Hypokalemia, improving  Continue medical treatment.   LOS: 0 days    Orpah Cobb  MD  10/22/2018, 3:02 PM

## 2018-10-22 NOTE — Progress Notes (Addendum)
Pulmonary Critical Care Medicine [facility] [place]   PULMONARY CRITICAL CARE SERVICE  PROGRESS NOTE  Date of Service: 10/22/2018  Shannon Lang  PYK:[id number]  DOB: 1945/11/10   DOA: 10/17/2018  Referring Physician: Carron Curie, MD  HPI: Shannon Lang is a 73 y.o. female seen for follow up of Acute on Chronic Respiratory Failure.  Patient was initially BiPAP this morning 18/8 and 60% FiO2.  Respiratory is attempting to transition patient to heated high flow nasal cannula.  Patient stable at this time.    Medications: Reviewed on Rounds  Physical Exam:  Vitals: Pulse 59 respirations 30 BP 130/64 O2 sat 97% temp 6.0  Ventilator Settings BiPAP 18/8 60% FiO2  . General: Comfortable at this time . Eyes: Grossly normal lids, irises & conjunctiva . ENT: grossly tongue is normal . Neck: no obvious mass . Cardiovascular: S1 S2 normal no gallop . Respiratory: No rales or rhonchi noted . Abdomen: soft . Skin: no rash seen on limited exam . Musculoskeletal: not rigid . Psychiatric:unable to assess . Neurologic: no seizure no involuntary movements         Lab Data:   Basic Metabolic Panel: Recent Labs  Lab 10/18/18 0351 10/18/18 2059 10/19/18 0707 10/19/18 1038 10/21/18 0750 10/22/18 0645 10/22/18 1110  NA 142 141  --   --  142  --   --   K 3.7 3.4*  --  3.5 2.7* 3.4*  --   CL 96* 96*  --   --  88*  --   --   CO2 38* 37*  --   --  46*  --   --   GLUCOSE 135* 135*  --   --  228*  --   --   BUN 10 12  --   --  10  --   --   CREATININE 0.85 0.75  --   --  0.68  --   --   CALCIUM 8.9 8.9  --   --  8.5*  --   --   MG 1.7  --  2.1  --  1.5*  --  1.8  PHOS  --   --   --   --  2.9  --   --     ABG: Recent Labs  Lab 10/18/18 0835 10/19/18 0500 10/20/18 1035 10/21/18 0315 10/21/18 1142  PHART 7.358 7.359 7.367 7.412 7.337*  PCO2ART 69.2* 69.5* 78.1* 76.8* 94.0*  PO2ART 71.6* 68.8* 79.6* 65.0* 119*  HCO3 38.5* 38.2* 43.8* 47.9* 49.0*  O2SAT 95.1 93.2 95.6  92.5 98.2    Liver Function Tests: Recent Labs  Lab 10/18/18 0351  AST 18  ALT 11  ALKPHOS 97  BILITOT 0.6  PROT 6.3*  ALBUMIN 2.0*   No results for input(s): LIPASE, AMYLASE in the last 168 hours. No results for input(s): AMMONIA in the last 168 hours.  CBC: Recent Labs  Lab 10/18/18 0351  WBC 12.3*  NEUTROABS 9.9*  HGB 8.1*  HCT 29.5*  MCV 87.5  PLT 444*    Cardiac Enzymes: No results for input(s): CKTOTAL, CKMB, CKMBINDEX, TROPONINI in the last 168 hours.  BNP (last 3 results) No results for input(s): BNP in the last 8760 hours.  ProBNP (last 3 results) No results for input(s): PROBNP in the last 8760 hours.  Radiological Exams: No results found.  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 Acute on chronic respiratory failure with hypoxia patient does well on heated high flow during the day and rest on BiPAP at night.  Continue secretion management pulmonary toilet. 2. Aspiration pneumonia.  Continue to follow 3. Atrial fibrillation with RVR rate controlled 4. Chronic congestive heart failure diastolic continue to monitor 5. Severe COPD continue to monitor as necessary   I have personally seen and evaluated the patient, evaluated laboratory and imaging results, formulated the assessment and plan and placed orders. The Patient requires high complexity decision making for assessment and support.  Case was discussed on Rounds with the Respiratory Therapy Staff  Yevonne PaxSaadat A Khan, MD [facility] Pulmonary Critical Care Medicine Sleep Medicine

## 2018-10-23 ENCOUNTER — Other Ambulatory Visit (HOSPITAL_COMMUNITY): Payer: Medicare Other

## 2018-10-23 DIAGNOSIS — J9621 Acute and chronic respiratory failure with hypoxia: Secondary | ICD-10-CM | POA: Diagnosis not present

## 2018-10-23 DIAGNOSIS — I4891 Unspecified atrial fibrillation: Secondary | ICD-10-CM | POA: Diagnosis not present

## 2018-10-23 DIAGNOSIS — J690 Pneumonitis due to inhalation of food and vomit: Secondary | ICD-10-CM | POA: Diagnosis not present

## 2018-10-23 DIAGNOSIS — J9811 Atelectasis: Secondary | ICD-10-CM

## 2018-10-23 DIAGNOSIS — I5032 Chronic diastolic (congestive) heart failure: Secondary | ICD-10-CM | POA: Diagnosis not present

## 2018-10-23 LAB — BLOOD GAS, ARTERIAL
Acid-Base Excess: 13.9 mmol/L — ABNORMAL HIGH (ref 0.0–2.0)
Acid-Base Excess: 14.1 mmol/L — ABNORMAL HIGH (ref 0.0–2.0)
Bicarbonate: 43.5 mmol/L — ABNORMAL HIGH (ref 20.0–28.0)
Bicarbonate: 41.4 mmol/L — ABNORMAL HIGH (ref 20.0–28.0)
FIO2: 100
FIO2: 100
MECHVT: 450 mL
O2 Saturation: 70.5 %
O2 Saturation: 72.6 %
PEEP: 8 cmH2O
PEEP: 10 cmH2O
Patient temperature: 98.6
Patient temperature: 98.6
Pressure control: 30 cmH2O
RATE: 22 resp/min
RATE: 30 resp/min
pCO2 arterial: 95 mmHg (ref 32.0–48.0)
pH, Arterial: 7.112 — CL (ref 7.350–7.450)
pH, Arterial: 7.262 — ABNORMAL LOW (ref 7.350–7.450)
pO2, Arterial: 53.2 mmHg — ABNORMAL LOW (ref 83.0–108.0)
pO2, Arterial: 46.2 mmHg — ABNORMAL LOW (ref 83.0–108.0)

## 2018-10-23 NOTE — Procedures (Signed)
Date: 10/23/2018,  MRN# [id number]    Procedure Note: Fiberoptic Bronchoscopy   PROCEDURE DATE: 10/23/2018     NAME:  Shannon Lang   DOB:12/11/1945   MRN: [id number] LOC:  5E19C/5E19C-01      Indications/Preliminary Diagnosis: Atelectasis left lung  Consent: (Place X beside choice/s below)  The benefits, risks and possible complications of the procedure were        explained to:  ___ patient  ___ patient's family  __X_ other:_Emergent __________  who verbalized understanding and gave:  ___ verbal  ___ written  ___ verbal and written  ___ telephone  ___ other:________ consent.      Unable to obtain consent; procedure performed on emergent basis.     Other:      PRESEDATION ASSESSMENT: History and Physical has been performed. Patient meds and allergies have been reviewed. Presedation airway examination has been performed and documented. Baseline vital signs, sedation score, oxygenation status, and cardiac rhythm were reviewed. Patient was deemed to be in satisfactory condition to undergo the procedure.  PREMEDICATIONS:   Sedative/Narcotic Amt Dose   Versed  mg   Fentanyl 200 mcg  Diprivan  mg         Insertion Route (Place X beside choice below)   Nasal   Oral  X Endotracheal Tube   Tracheostomy   INTRAPROCEDURE MEDICATIONS:  Sedative/Narcotic Amt Dose   Versed  mg   Fentanyl  mcg  Diprivan  mg       Medication Amt Dose  Medication Amt Dose  Xylocaine 2%  cc  Epinephrine 1:10,000 sol  cc  Xylocaine 4%  cc  Cocaine  cc   TECHNICAL PROCEDURES: (Place X beside choice below)   Procedures  Description  X  None     Electrocautery     Cryotherapy     Balloon Dilatation     Bronchography     Stent Placement     Therapeutic Aspiration     Laser/Argon Plasma            SPECIMENS (Sites): (Place X beside choice below)  Specimens Description  X No Specimens Obtained     Washings    Lavage    Biopsies    Fine Needle Aspirates    Brushings    Sputum     FINDINGS:  ESTIMATED BLOOD LOSS: none  COMPLICATIONS/RESOLUTION: none  PROCEDURE DETAILS: Timeout performed and correct patient, name, & ID confirmed. Following prep per Pulmonary policy, appropriate sedation was administered.  Airway exam proceeded with findings, technical procedures, and specimen collection as noted below. At the end of exam the scope was withdrawn without incident. Impression and Plan as noted below.    Procedure Note: After adequate sedation was achieved and blood pressure hemodynamics were stabilized fiberoptic scope was inserted through the endotracheal tube down to the carina.  On immediate assessment there was a almost masslike mucous plug noted in the left mainstem bronchus.  We instilled Mucomyst directly as well as saline to try to get plug to break up so that we could suction it out.  We were able to bring up some plugs however it was again very dry and very difficult to suction through the fiberoptic scope.  This required multiple insertions and removals of the scope and also required ongoing aggressive lavages.  We had to back the patient simultaneously while we were doing the bronchoscopy and eventually we were able to clear the mucous plug from the left lung.  Right lung was examined and  also there was some mucus present but not as significant at the left side.  The right lung was also cleaned out.  Underlying mucosa was somewhat erythematous but no masses were noted.  Saturations did improve and a follow-up chest x-ray was ordered    IMPRESSION:POST-PROCEDURE DX: Atelectasis of the left lung secondary to mucous plug blocking the left mainstem bronchus   RECOMMENDATION/PLAN: Aggressive pulmonary toilet intermittent bag lavage chest PT and consider hypertonic saline  I have personally performed the procedure as noted above     Yevonne Pax, MD [facility] Pulmonary Critical Care Medicine

## 2018-10-23 NOTE — Progress Notes (Signed)
[facility] [place]   PULMONARY CRITICAL CARE SERVICE  PROGRESS NOTE  Date of Service: 10/23/2018  Shannon NianSheila Lang  ZOX:[id number]  DOB: 01/05/1946   DOA: 10/17/2018  Referring Physician: Carron CurieAli Hijazi, MD  HPI: Shannon Lang is a 73 y.o. female seen for follow up of Acute on Chronic Respiratory Failure.  Patient decompensated suddenly this morning reportedly patient had increased work of breathing and desaturations.  The primary care team that was on the scene attempted intubation which was actually quite difficult.  The anesthesiologist/pain management physician came in and ended up having to intubate the patient.  The staff noted that she was having difficulty with bagging and that the respiratory therapist were barely able to bag her.  At the time that I came and her saturations were in the 55% range her blood pressure was 62 systolic and she had been started on Levophed which was running at about 20 mcg.  Instructed the team to increase the Levophed and try to bag lavage her.  Bag lavaging was without success.  Levophed was increased to 40 mics and then Neo-Synephrine was also added this did help to improve her blood pressure but saturations were still not improving.  Chest x-ray revealed presence of pneumomediastinum post intubation as well as whiteout of the left lung.  Concern was raised for atelectasis which was the most likely diagnosis.  We decided on an emergent basis to perform a diagnostic and potentially therapeutic bronchoscopy.  For the atelectasis.  Medications: Reviewed on Rounds  Physical Exam:  Vitals: Temperature was 97 pulse was 100 respiratory rate 30 saturations 55%  Ventilator Settings patient was on pressure control FiO2 was 100% PEEP of 10  . General: Comfortable at this time . Eyes: Grossly normal lids, irises & conjunctiva . ENT: grossly tongue is normal . Neck: no obvious mass . Cardiovascular: S1 S2 normal no  gallop . Respiratory: Diminished breath sounds bilaterally . Abdomen: soft . Skin: no rash seen on limited exam . Musculoskeletal: not rigid . Psychiatric:unable to assess . Neurologic: no seizure no involuntary movements         Lab Data:   Basic Metabolic Panel: Recent Labs  Lab 10/18/18 0351 10/18/18 2059 10/19/18 0707 10/19/18 1038 10/21/18 0750 10/22/18 0645 10/22/18 1110  NA 142 141  --   --  142  --   --   K 3.7 3.4*  --  3.5 2.7* 3.4*  --   CL 96* 96*  --   --  88*  --   --   CO2 38* 37*  --   --  46*  --   --   GLUCOSE 135* 135*  --   --  228*  --   --   BUN 10 12  --   --  10  --   --   CREATININE 0.85 0.75  --   --  0.68  --   --   CALCIUM 8.9 8.9  --   --  8.5*  --   --   MG 1.7  --  2.1  --  1.5*  --  1.8  PHOS  --   --   --   --  2.9  --   --     ABG: Recent Labs  Lab 10/21/18 0315 10/21/18 1142 10/22/18 1358 10/23/18 1200 10/23/18 1356  PHART 7.412 7.337* 7.341* 7.112* 7.262*  PCO2ART 76.8* 94.0* 89.7* CRITICAL RESULT CALLED TO, READ BACK BY AND VERIFIED WITH:  95.0*  PO2ART 65.0* 119* 86.6 53.2* 46.2*  HCO3 47.9* 49.0* 47.2* 43.5* 41.4*  O2SAT 92.5 98.2 96.3 70.5 72.6    Liver Function Tests: Recent Labs  Lab 10/18/18 0351  AST 18  ALT 11  ALKPHOS 97  BILITOT 0.6  PROT 6.3*  ALBUMIN 2.0*   No results for input(s): LIPASE, AMYLASE in the last 168 hours. No results for input(s): AMMONIA in the last 168 hours.  CBC: Recent Labs  Lab 10/18/18 0351  WBC 12.3*  NEUTROABS 9.9*  HGB 8.1*  HCT 29.5*  MCV 87.5  PLT 444*    Cardiac Enzymes: No results for input(s): CKTOTAL, CKMB, CKMBINDEX, TROPONINI in the last 168 hours.  BNP (last 3 results) No results for input(s): BNP in the last 8760 hours.  ProBNP (last 3 results) No results for input(s): PROBNP in the last 8760 hours.  Radiological Exams: Dg Chest Port 1 View  Result Date: 10/23/2018 CLINICAL DATA:  73 year old female with endotracheal tube adjustment EXAM: PORTABLE  CHEST 1 VIEW COMPARISON:  Multiple prior most recent 10/23/2018, and 10/20/2018 FINDINGS: Endotracheal tube terminates 3 cm above the carina at the clavicular heads. Unchanged left upper extremity PICC. Gastric tube terminates at the GE junction. Similar appearance of mediastinal gas, and gas within the fascial planes of the base of the neck. Redemonstration of gas along the left mediastinum outlining the cardiomediastinal silhouette. Similar appearance of complete opacification of the left hemithorax with no residual aerated lung. Right lung remains similarly aerated, with blunting of the right costophrenic angle and no pneumothorax. Reticular opacity in the hilar, suprahilar, infrahilar region, difficult to exclude interstitial emphysema. Defibrillator pads project over the lower chest. IMPRESSION: Endotracheal tube terminates 3 cm above the carina, at the level of the clavicular heads. Similar appearance of complete opacification of the left hemithorax with no residual aerated lung. Differential includes pleural fluid/hematoma, or potentially subpleural fluid/hematoma, with complete atelectasis/collapse. These results were discussed by telephone at the time of interpretation on 10/23/2018 at 1:58 pm with Dr. Freda Munro Similar appearance of the right lung with persisting mixed interstitial and airspace opacity and small pleural effusion. Small amount of interstitial and emphysema cannot be excluded. Gastric tube terminates at the GE junction. Electronically Signed   By: Gilmer Mor D.O.   On: 10/23/2018 14:08   Dg Chest Port 1 View  Result Date: 10/23/2018 CLINICAL DATA:  Endotracheal tube placement. EXAM: PORTABLE CHEST 1 VIEW COMPARISON:  Radiograph of October 20, 2018. FINDINGS: Stable cardiomegaly. Endotracheal tube is in grossly good position. Distal tip of nasogastric tube appears to be above the gastroesophageal junction. No definite pneumothorax is noted. Mild right basilar pleural effusion is noted.  There is interval development of pneumomediastinum. Complete opacification of left lung is noted concerning for atelectasis or effusion. Subcutaneous emphysema is seen in the cervical soft tissues bilaterally as well. Bony thorax is unremarkable. IMPRESSION: Interval development of large pneumomediastinum is noted with extension into the cervical soft tissues. This is concerning for tracheal or bronchial injury. Endotracheal tube is in grossly good position. Distal tip of nasogastric tube appears to be above gastroesophageal junction. There is also noted interval development of complete opacification of left lung which may represent atelectasis, pneumonia or effusion. CT scan may be performed for further evaluation. Mild right pleural effusion is noted with stable diffuse right lung opacity concerning for pneumonia or edema. These results will be called to the ordering clinician or representative by the Radiologist Assistant, and communication documented in the PACS or  zVision Dashboard. Electronically Signed   By: Lupita Raider M.D.   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 Acute on chronic respiratory failure with hypoxia with acute decompensation likely mucous plugging with left-sided atelectasis.  She was having great deal of difficulty ventilating.  Patient also was hypotensive which was felt initially to be due to the propofol which was stopped and changed over to fentanyl.  In addition patient was started on Neo-Synephrine at 100 mcg and the Levophed was continued at 40 mics.  Once she becomes hemodynamically stable we will plan on doing an emergent bronchoscopy to look for mucous plugging. 2. Aspiration pneumonia due to  gastric secretions patient had been treated with antibiotics again has a white out on the left this could be pneumonia versus mucous plugging and atelectasis 3. Atrial fibrillation with RVR right now the rate is controlled cardiology is following 4. Chronic congestive heart failure at baseline we will continue with supportive care 5. Severe COPD prognosis guarded   The primary care team spoke with the patient's daughter and they have changed her CODE STATUS to DNR   I have personally seen and evaluated the patient, evaluated laboratory and imaging results, formulated the assessment and plan and placed orders.  Time 70 minutes patient is critically ill in danger of cardiac arrest and death with an acute change in her status The Patient requires high complexity decision making for assessment and support.  Case was discussed on Rounds with the Respiratory Therapy Staff  Yevonne Pax, MD [facility] Pulmonary Critical Care Medicine Sleep Medicine

## 2018-10-24 ENCOUNTER — Other Ambulatory Visit (HOSPITAL_COMMUNITY): Payer: Medicare Other

## 2018-10-24 DIAGNOSIS — Z9889 Other specified postprocedural states: Secondary | ICD-10-CM

## 2018-10-24 DIAGNOSIS — I4891 Unspecified atrial fibrillation: Secondary | ICD-10-CM | POA: Diagnosis not present

## 2018-10-24 DIAGNOSIS — J690 Pneumonitis due to inhalation of food and vomit: Secondary | ICD-10-CM | POA: Diagnosis not present

## 2018-10-24 DIAGNOSIS — I5032 Chronic diastolic (congestive) heart failure: Secondary | ICD-10-CM | POA: Diagnosis not present

## 2018-10-24 DIAGNOSIS — J398 Other specified diseases of upper respiratory tract: Secondary | ICD-10-CM

## 2018-10-24 DIAGNOSIS — J9621 Acute and chronic respiratory failure with hypoxia: Secondary | ICD-10-CM | POA: Diagnosis not present

## 2018-10-24 LAB — BLOOD GAS, ARTERIAL
Acid-Base Excess: 23.3 mmol/L — ABNORMAL HIGH (ref 0.0–2.0)
Bicarbonate: 48.2 mmol/L — ABNORMAL HIGH (ref 20.0–28.0)
FIO2: 1
O2 Saturation: 99.9 %
PEEP: 10 cmH2O
Patient temperature: 101.1
Pressure control: 30 cmH2O
RATE: 30 resp/min
pCO2 arterial: 52.1 mmHg — ABNORMAL HIGH (ref 32.0–48.0)
pH, Arterial: 7.579 — ABNORMAL HIGH (ref 7.350–7.450)
pO2, Arterial: 196 mmHg — ABNORMAL HIGH (ref 83.0–108.0)

## 2018-10-24 LAB — CBC
HCT: 23.7 % — ABNORMAL LOW (ref 36.0–46.0)
Hemoglobin: 7.3 g/dL — ABNORMAL LOW (ref 12.0–15.0)
MCH: 26.2 pg (ref 26.0–34.0)
MCHC: 30.8 g/dL (ref 30.0–36.0)
MCV: 84.9 fL (ref 80.0–100.0)
Platelets: 336 10*3/uL (ref 150–400)
RBC: 2.79 MIL/uL — ABNORMAL LOW (ref 3.87–5.11)
RDW: 17.2 % — ABNORMAL HIGH (ref 11.5–15.5)
WBC: 20.5 10*3/uL — ABNORMAL HIGH (ref 4.0–10.5)
nRBC: 0.2 % (ref 0.0–0.2)

## 2018-10-24 LAB — BASIC METABOLIC PANEL
Anion gap: 6 (ref 5–15)
BUN: 26 mg/dL — ABNORMAL HIGH (ref 8–23)
CO2: 49 mmol/L — ABNORMAL HIGH (ref 22–32)
Calcium: 8.3 mg/dL — ABNORMAL LOW (ref 8.9–10.3)
Chloride: 82 mmol/L — ABNORMAL LOW (ref 98–111)
Creatinine, Ser: 1.02 mg/dL — ABNORMAL HIGH (ref 0.44–1.00)
GFR calc Af Amer: >60 mL/min (ref >60)
GFR calc non Af Amer: 56 mL/min — ABNORMAL LOW (ref >60)
Glucose, Bld: 224 mg/dL — ABNORMAL HIGH (ref 70–99)
Potassium: 4.2 mmol/L (ref 3.5–5.1)
Sodium: 137 mmol/L (ref 135–145)

## 2018-10-24 LAB — PHOSPHORUS: Phosphorus: 1.5 mg/dL — ABNORMAL LOW (ref 2.5–4.6)

## 2018-10-24 LAB — MAGNESIUM: Magnesium: 1.6 mg/dL — ABNORMAL LOW (ref 1.7–2.4)

## 2018-10-24 NOTE — Progress Notes (Signed)
Pulmonary Critical Care Medicine [facility] [place]   PULMONARY CRITICAL CARE SERVICE  PROGRESS NOTE  Date of Service: 10/24/2018  Shannon Lang  WUJ:[id number]  DOB: 09/27/1945   DOA: 10/17/2018  Referring Physician: Carron Curie, MD  HPI: Shannon Lang is a 73 y.o. female seen for follow up of Acute on Chronic Respiratory Failure.  She was admitted through the night she is currently still on Levophed and Neo-Synephrine which is however being weaned both medications are being weaned and right now she is on propofol and fentanyl.  Of note she has a large swelling over the left chest which appears like there is a bruise over it.  I try to see if this is actually air crepitus but it appears more to be solid and it also appeared to be painful to her.  I would think this would be more consistent with a perhaps a hematoma from the code yesterday.  Medications: Reviewed on Rounds  Physical Exam:  Vitals: Temperature 100.7 pulse 83 respiratory 30 blood pressure 123/60 saturations 100%  Ventilator Settings mode ventilation assist control FiO2 50% tidal volume 400 titrating PEEP down 10  . General: Comfortable at this time . Eyes: Grossly normal lids, irises & conjunctiva . ENT: grossly tongue is normal . Neck: no obvious mass . Cardiovascular: S1 S2 normal no gallop . Respiratory: Coarse breath sounds improved air entry though bilaterally . Abdomen: soft . Skin: no rash seen on limited exam . Musculoskeletal: not rigid . Psychiatric:unable to assess . Neurologic: no seizure no involuntary movements         Lab Data:   Basic Metabolic Panel: Recent Labs  Lab 10/18/18 0351 10/18/18 2059 10/19/18 0707 10/19/18 1038 10/21/18 0750 10/22/18 0645 10/22/18 1110 10/24/18 0522  NA 142 141  --   --  142  --   --  137  K 3.7 3.4*  --  3.5 2.7* 3.4*  --  4.2  CL 96* 96*  --   --  88*  --   --  82*  CO2 38* 37*  --   --  46*  --   --  49*  GLUCOSE 135* 135*  --   --  228*   --   --  224*  BUN 10 12  --   --  10  --   --  26*  CREATININE 0.85 0.75  --   --  0.68  --   --  1.02*  CALCIUM 8.9 8.9  --   --  8.5*  --   --  8.3*  MG 1.7  --  2.1  --  1.5*  --  1.8 1.6*  PHOS  --   --   --   --  2.9  --   --  1.5*    ABG: Recent Labs  Lab 10/21/18 1142 10/22/18 1358 10/23/18 1200 10/23/18 1356 10/24/18 0435  PHART 7.337* 7.341* 7.112* 7.262* 7.579*  PCO2ART 94.0* 89.7* CRITICAL RESULT CALLED TO, READ BACK BY AND VERIFIED WITH: 95.0* 52.1*  PO2ART 119* 86.6 53.2* 46.2* 196*  HCO3 49.0* 47.2* 43.5* 41.4* 48.2*  O2SAT 98.2 96.3 70.5 72.6 99.9    Liver Function Tests: Recent Labs  Lab 10/18/18 0351  AST 18  ALT 11  ALKPHOS 97  BILITOT 0.6  PROT 6.3*  ALBUMIN 2.0*   No results for input(s): LIPASE, AMYLASE in the last 168 hours. No results for input(s): AMMONIA in the last 168 hours.  CBC: Recent Labs  Lab  10/18/18 0351 10/24/18 0522  WBC 12.3* 20.5*  NEUTROABS 9.9*  --   HGB 8.1* 7.3*  HCT 29.5* 23.7*  MCV 87.5 84.9  PLT 444* 336    Cardiac Enzymes: No results for input(s): CKTOTAL, CKMB, CKMBINDEX, TROPONINI in the last 168 hours.  BNP (last 3 results) No results for input(s): BNP in the last 8760 hours.  ProBNP (last 3 results) No results for input(s): PROBNP in the last 8760 hours.  Radiological Exams: Dg Chest Port 1 View  Result Date: 10/24/2018 CLINICAL DATA:  Respiratory failure. EXAM: PORTABLE CHEST 1 VIEW COMPARISON:  One-view chest x-ray 10/23/2018 FINDINGS: The heart is enlarged. Endotracheal tube terminates 3.4 cm above the carina. There is marked improved aeration of both lungs. No pneumothorax is present. Residual basilar airspace disease is noted, right greater than left. Resolving right-sided subcutaneous emphysema is noted. IMPRESSION: 1. Improving airspace disease bilaterally. 2. Residual bibasilar airspace disease is worse on the right. 3. Resolving subcutaneous emphysema. 4. Support apparatus is stable.  Electronically Signed   By: Marin Robertshristopher  Mattern M.D.   On: 10/24/2018 06:56   Dg Chest Port 1 View  Result Date: 10/23/2018 CLINICAL DATA:  Status post bronchoscopy. EXAM: PORTABLE CHEST 1 VIEW COMPARISON:  10/23/2018 at 1318 hours FINDINGS: Endotracheal tube terminates at the clavicular heads. Enteric tube courses off the inferior margin of the image. There is significant rightward patient rotation on the current examination with pneumomediastinum less distinctly shown than on the prior study. There is new soft tissue emphysema in the right chest wall. Extensive airspace opacity remains throughout the left lung, mildly improved from the prior study. There is increasing patchy opacity throughout the right lung. A small left apical pneumothorax is suspected. Soft tissue emphysema is again seen in the lower neck. IMPRESSION: 1. Rotated examination with mildly improved aeration of the left lung and worsening right lung aeration. 2. Suspected small left apical pneumothorax. 3. New soft tissue emphysema in the right chest wall. These results will be called to the ordering clinician or representative by the Radiologist Assistant, and communication documented in the PACS or zVision Dashboard. Electronically Signed   By: Sebastian AcheAllen  Grady M.D.   On: 10/23/2018 15:38   Dg Chest Port 1 View  Result Date: 10/23/2018 CLINICAL DATA:  73 year old female with endotracheal tube adjustment EXAM: PORTABLE CHEST 1 VIEW COMPARISON:  Multiple prior most recent 10/23/2018, and 10/20/2018 FINDINGS: Endotracheal tube terminates 3 cm above the carina at the clavicular heads. Unchanged left upper extremity PICC. Gastric tube terminates at the GE junction. Similar appearance of mediastinal gas, and gas within the fascial planes of the base of the neck. Redemonstration of gas along the left mediastinum outlining the cardiomediastinal silhouette. Similar appearance of complete opacification of the left hemithorax with no residual aerated  lung. Right lung remains similarly aerated, with blunting of the right costophrenic angle and no pneumothorax. Reticular opacity in the hilar, suprahilar, infrahilar region, difficult to exclude interstitial emphysema. Defibrillator pads project over the lower chest. IMPRESSION: Endotracheal tube terminates 3 cm above the carina, at the level of the clavicular heads. Similar appearance of complete opacification of the left hemithorax with no residual aerated lung. Differential includes pleural fluid/hematoma, or potentially subpleural fluid/hematoma, with complete atelectasis/collapse. These results were discussed by telephone at the time of interpretation on 10/23/2018 at 1:58 pm with Dr. Freda MunroSAADAT KHAN Similar appearance of the right lung with persisting mixed interstitial and airspace opacity and small pleural effusion. Small amount of interstitial and emphysema cannot be excluded. Gastric  tube terminates at the GE junction. Electronically Signed   By: Gilmer Mor D.O.   On: 10/23/2018 14:08   Dg Chest Port 1 View  Result Date: 10/23/2018 CLINICAL DATA:  Endotracheal tube placement. EXAM: PORTABLE CHEST 1 VIEW COMPARISON:  Radiograph of October 20, 2018. FINDINGS: Stable cardiomegaly. Endotracheal tube is in grossly good position. Distal tip of nasogastric tube appears to be above the gastroesophageal junction. No definite pneumothorax is noted. Mild right basilar pleural effusion is noted. There is interval development of pneumomediastinum. Complete opacification of left lung is noted concerning for atelectasis or effusion. Subcutaneous emphysema is seen in the cervical soft tissues bilaterally as well. Bony thorax is unremarkable. IMPRESSION: Interval development of large pneumomediastinum is noted with extension into the cervical soft tissues. This is concerning for tracheal or bronchial injury. Endotracheal tube is in grossly good position. Distal tip of nasogastric tube appears to be above gastroesophageal  junction. There is also noted interval development of complete opacification of left lung which may represent atelectasis, pneumonia or effusion. CT scan may be performed for further evaluation. Mild right pleural effusion is noted with stable diffuse right lung opacity concerning for pneumonia or edema. These results will be called to the ordering clinician or representative by the Radiologist Assistant, and communication documented in the PACS or zVision Dashboard. Electronically Signed   By: Lupita Raider M.D.   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 Acute on chronic respiratory failure with hypoxia patient has had significant improvement on her chest x-ray with aeration of the left lung the right lung appears to be still somewhat hazy and likely represents residual mucous plugs.  Recommended aggressive pulmonary toilet and suctioning if it does not improve then would consider doing another bronchoscopy. 2. Cardiac arrest right now rhythm is stable she does have what appears to be a hematoma would consider getting a CT of the soft tissue chest to further assess along with assessing the lungs for the pneumomediastinum. 3. Aspiration pneumonia treated patient will need to continue with antibiotics still has a low-grade fever. 4. Atrial fibrillation right now rate controlled cardiology is following 5. Chronic congestive heart failure with diastolic dysfunction follow the fluid status closely. 6. Severe COPD continue with present management.   I have personally seen and evaluated the patient, evaluated laboratory and imaging results, formulated the assessment and plan and placed orders.  Time 35 minutes patient is  critically ill in danger of cardiac arrest and death and remains on complex drips including fentanyl propofol Neo-Synephrine and Levophed The Patient requires high complexity decision making for assessment and support.  Case was discussed on Rounds with the Respiratory Therapy Staff  Yevonne Pax, MD [facility] Pulmonary Critical Care Medicine Sleep Medicine

## 2018-10-25 ENCOUNTER — Other Ambulatory Visit (HOSPITAL_COMMUNITY): Payer: Medicare Other

## 2018-10-25 DIAGNOSIS — J690 Pneumonitis due to inhalation of food and vomit: Secondary | ICD-10-CM | POA: Diagnosis not present

## 2018-10-25 DIAGNOSIS — J9621 Acute and chronic respiratory failure with hypoxia: Secondary | ICD-10-CM | POA: Diagnosis not present

## 2018-10-25 DIAGNOSIS — I4891 Unspecified atrial fibrillation: Secondary | ICD-10-CM | POA: Diagnosis not present

## 2018-10-25 DIAGNOSIS — I5032 Chronic diastolic (congestive) heart failure: Secondary | ICD-10-CM | POA: Diagnosis not present

## 2018-10-25 NOTE — Progress Notes (Addendum)
Pulmonary Critical Care Medicine [facility] [place]   PULMONARY CRITICAL CARE SERVICE  PROGRESS NOTE  Date of Service: 10/25/2018  Shannon Lang  [id number]  DOB: 1946-04-04   DOA: 10/17/2018  Referring Physician: Carron Curie, MD  HPI: Shannon Lang is a 73 y.o. female seen for follow up of Acute on Chronic Respiratory Failure.  Patient remains on full support patient is afebrile with no distress.  Medications: Reviewed on Rounds  Physical Exam:  Vitals: Pulse 90 respirations 18 BP 104/54 O2 sat 97% temp 99.6  Ventilator Settings auto mode AC PC rate of 30 IP 30 PEEP of 5 FiO2 40%  . General: Comfortable at this time . Eyes: Grossly normal lids, irises & conjunctiva . ENT: grossly tongue is normal . Neck: no obvious mass . Cardiovascular: S1 S2 normal no gallop . Respiratory: Coarse breath sounds . Abdomen: soft . Skin: no rash seen on limited exam . Musculoskeletal: not rigid . Psychiatric:unable to assess . Neurologic: no seizure no involuntary movements         Lab Data:   Basic Metabolic Panel: Recent Labs  Lab 10/18/18 2059 10/19/18 0707 10/19/18 1038 10/21/18 0750 10/22/18 0645 10/22/18 1110 10/24/18 0522  NA 141  --   --  142  --   --  137  K 3.4*  --  3.5 2.7* 3.4*  --  4.2  CL 96*  --   --  88*  --   --  82*  CO2 37*  --   --  46*  --   --  49*  GLUCOSE 135*  --   --  228*  --   --  224*  BUN 12  --   --  10  --   --  26*  CREATININE 0.75  --   --  0.68  --   --  1.02*  CALCIUM 8.9  --   --  8.5*  --   --  8.3*  MG  --  2.1  --  1.5*  --  1.8 1.6*  PHOS  --   --   --  2.9  --   --  1.5*    ABG: Recent Labs  Lab 10/21/18 1142 10/22/18 1358 10/23/18 1200 10/23/18 1356 10/24/18 0435  PHART 7.337* 7.341* 7.112* 7.262* 7.579*  PCO2ART 94.0* 89.7* CRITICAL RESULT CALLED TO, READ BACK BY AND VERIFIED WITH: 95.0* 52.1*  PO2ART 119* 86.6 53.2* 46.2* 196*  HCO3 49.0* 47.2* 43.5* 41.4* 48.2*  O2SAT 98.2 96.3 70.5 72.6 99.9     Liver Function Tests: No results for input(s): AST, ALT, ALKPHOS, BILITOT, PROT, ALBUMIN in the last 168 hours. No results for input(s): LIPASE, AMYLASE in the last 168 hours. No results for input(s): AMMONIA in the last 168 hours.  CBC: Recent Labs  Lab 10/24/18 0522  WBC 20.5*  HGB 7.3*  HCT 23.7*  MCV 84.9  PLT 336    Cardiac Enzymes: No results for input(s): CKTOTAL, CKMB, CKMBINDEX, TROPONINI in the last 168 hours.  BNP (last 3 results) No results for input(s): BNP in the last 8760 hours.  ProBNP (last 3 results) No results for input(s): PROBNP in the last 8760 hours.  Radiological Exams: Dg Abd 1 View  Result Date: 10/25/2018 CLINICAL DATA:  NG tube placement EXAM: ABDOMEN - 1 VIEW COMPARISON:  None. FINDINGS: An NG tube is identified with tip overlying the distal stomach. IMPRESSION: NG tube with tip overlying the distal stomach. Electronically Signed   By: Tinnie Gens  Hu M.D.   On: 10/25/2018 15:20   Dg Chest Port 1 View  Result Date: 10/24/2018 CLINICAL DATA:  Respiratory failure. EXAM: PORTABLE CHEST 1 VIEW COMPARISON:  One-view chest x-ray 10/23/2018 FINDINGS: The heart is enlarged. Endotracheal tube terminates 3.4 cm above the carina. There is marked improved aeration of both lungs. No pneumothorax is present. Residual basilar airspace disease is noted, right greater than left. Resolving right-sided subcutaneous emphysema is noted. IMPRESSION: 1. Improving airspace disease bilaterally. 2. Residual bibasilar airspace disease is worse on the right. 3. Resolving subcutaneous emphysema. 4. Support apparatus is stable. Electronically Signed   By: Marin Robertshristopher  Mattern M.D.   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 Acute on chronic respiratory failure with hypoxia remains on full support time.  Continue pulmonary toilet and secretion management.  Currently requiring FiO2 of 40% 2. Cardiac arrest rhythm stable 3. Aspiration pneumonia treated continue antibiotics 4. Atrial fibrillation rate controlled cardiology is following 5. Chronic congestive heart failure with diastolic dysfunction continue to monitor fluid status 6. Severe COPD continue present management   I have personally seen and evaluated the patient, evaluated laboratory and imaging results, formulated the assessment and plan and placed orders. The Patient requires high complexity decision making for assessment and support.  Case was discussed on Rounds with the Respiratory Therapy Staff  Yevonne PaxSaadat A Andre Swander, MD [facility] Pulmonary Critical Care Medicine Sleep Medicine

## 2018-10-26 ENCOUNTER — Other Ambulatory Visit (HOSPITAL_COMMUNITY): Payer: Medicare Other

## 2018-10-26 DIAGNOSIS — J690 Pneumonitis due to inhalation of food and vomit: Secondary | ICD-10-CM | POA: Diagnosis not present

## 2018-10-26 DIAGNOSIS — I5032 Chronic diastolic (congestive) heart failure: Secondary | ICD-10-CM | POA: Diagnosis not present

## 2018-10-26 DIAGNOSIS — I4891 Unspecified atrial fibrillation: Secondary | ICD-10-CM | POA: Diagnosis not present

## 2018-10-26 DIAGNOSIS — J9621 Acute and chronic respiratory failure with hypoxia: Secondary | ICD-10-CM | POA: Diagnosis not present

## 2018-10-26 LAB — BLOOD GAS, ARTERIAL
Acid-Base Excess: 14.2 mmol/L — ABNORMAL HIGH (ref 0.0–2.0)
Bicarbonate: 38 mmol/L — ABNORMAL HIGH (ref 20.0–28.0)
FIO2: 80
O2 Saturation: 91.9 %
PEEP: 5 cmH2O
Patient temperature: 98.6
Pressure control: 30 cmH2O
RATE: 30 resp/min
pCO2 arterial: 42.8 mmHg (ref 32.0–48.0)
pH, Arterial: 7.556 — ABNORMAL HIGH (ref 7.350–7.450)
pO2, Arterial: 62 mmHg — ABNORMAL LOW (ref 83.0–108.0)

## 2018-10-26 LAB — ABO/RH: ABO/RH(D): O POS

## 2018-10-26 LAB — BASIC METABOLIC PANEL
Anion gap: 11 (ref 5–15)
BUN: 38 mg/dL — ABNORMAL HIGH (ref 8–23)
CO2: 37 mmol/L — ABNORMAL HIGH (ref 22–32)
Calcium: 8 mg/dL — ABNORMAL LOW (ref 8.9–10.3)
Chloride: 84 mmol/L — ABNORMAL LOW (ref 98–111)
Creatinine, Ser: 1.28 mg/dL — ABNORMAL HIGH (ref 0.44–1.00)
GFR calc Af Amer: 49 mL/min — ABNORMAL LOW (ref >60)
GFR calc non Af Amer: 42 mL/min — ABNORMAL LOW (ref >60)
Glucose, Bld: 344 mg/dL — ABNORMAL HIGH (ref 70–99)
Potassium: 3.3 mmol/L — ABNORMAL LOW (ref 3.5–5.1)
Sodium: 132 mmol/L — ABNORMAL LOW (ref 135–145)

## 2018-10-26 LAB — CBC
HCT: 20.1 % — ABNORMAL LOW (ref 36.0–46.0)
Hemoglobin: 6 g/dL — CL (ref 12.0–15.0)
MCH: 24.5 pg — ABNORMAL LOW (ref 26.0–34.0)
MCHC: 29.9 g/dL — ABNORMAL LOW (ref 30.0–36.0)
MCV: 82 fL (ref 80.0–100.0)
Platelets: 202 10*3/uL (ref 150–400)
RBC: 2.45 MIL/uL — ABNORMAL LOW (ref 3.87–5.11)
RDW: 18.1 % — ABNORMAL HIGH (ref 11.5–15.5)
WBC: 19.4 10*3/uL — ABNORMAL HIGH (ref 4.0–10.5)
nRBC: 0.4 % — ABNORMAL HIGH (ref 0.0–0.2)

## 2018-10-26 LAB — PREPARE RBC (CROSSMATCH)

## 2018-10-26 LAB — VANCOMYCIN, TROUGH: Vancomycin Tr: 20 ug/mL (ref 15–20)

## 2018-10-26 NOTE — Progress Notes (Addendum)
Pulmonary Critical Care Medicine [facility] [place]   PULMONARY CRITICAL CARE SERVICE  PROGRESS NOTE  Date of Service: 10/26/2018  Shannon Lang  ZMC:[id number]  DOB: Jun 29, 1946   DOA: 10/17/2018  Referring Physician: Carron Curie, MD  HPI: Shannon Lang is a 73 y.o. female seen for follow up of Acute on Chronic Respiratory Failure.  Patient continues on full support at this time.  Decreased ventilator rate to 24 during morning ABG.  Medications: Reviewed on Rounds  Physical Exam:  Vitals: Pulse 75 respirations BP 98/44 O2 sat 97% temp 98.7  Ventilator Settings ventilator mode AC PC rate of 24 IP 30 PEEP of 5 FiO2 40%  . General: Comfortable at this time . Eyes: Grossly normal lids, irises & conjunctiva . ENT: grossly tongue is normal . Neck: no obvious mass . Cardiovascular: S1 S2 normal no gallop . Respiratory: Coarse breath sounds . Abdomen: soft . Skin: no rash seen on limited exam . Musculoskeletal: not rigid . Psychiatric:unable to assess . Neurologic: no seizure no involuntary movements         Lab Data:   Basic Metabolic Panel: Recent Labs  Lab 10/21/18 0750 10/22/18 0645 10/22/18 1110 10/24/18 0522 10/26/18 0548  NA 142  --   --  137 132*  K 2.7* 3.4*  --  4.2 3.3*  CL 88*  --   --  82* 84*  CO2 46*  --   --  49* 37*  GLUCOSE 228*  --   --  224* 344*  BUN 10  --   --  26* 38*  CREATININE 0.68  --   --  1.02* 1.28*  CALCIUM 8.5*  --   --  8.3* 8.0*  MG 1.5*  --  1.8 1.6*  --   PHOS 2.9  --   --  1.5*  --     ABG: Recent Labs  Lab 10/22/18 1358 10/23/18 1200 10/23/18 1356 10/24/18 0435 10/26/18 0605  PHART 7.341* 7.112* 7.262* 7.579* 7.556*  PCO2ART 89.7* CRITICAL RESULT CALLED TO, READ BACK BY AND VERIFIED WITH: 95.0* 52.1* 42.8  PO2ART 86.6 53.2* 46.2* 196* 62.0*  HCO3 47.2* 43.5* 41.4* 48.2* 38.0*  O2SAT 96.3 70.5 72.6 99.9 91.9    Liver Function Tests: No results for input(s): AST, ALT, ALKPHOS, BILITOT, PROT, ALBUMIN in  the last 168 hours. No results for input(s): LIPASE, AMYLASE in the last 168 hours. No results for input(s): AMMONIA in the last 168 hours.  CBC: Recent Labs  Lab 10/24/18 0522 10/26/18 0548  WBC 20.5* 19.4*  HGB 7.3* 6.0*  HCT 23.7* 20.1*  MCV 84.9 82.0  PLT 336 202    Cardiac Enzymes: No results for input(s): CKTOTAL, CKMB, CKMBINDEX, TROPONINI in the last 168 hours.  BNP (last 3 results) No results for input(s): BNP in the last 8760 hours.  ProBNP (last 3 results) No results for input(s): PROBNP in the last 8760 hours.  Radiological Exams: Dg Abd 1 View  Result Date: 10/25/2018 CLINICAL DATA:  NG tube placement EXAM: ABDOMEN - 1 VIEW COMPARISON:  None. FINDINGS: An NG tube is identified with tip overlying the distal stomach. IMPRESSION: NG tube with tip overlying the distal stomach. Electronically Signed   By: Harmon Pier M.D.   On: 10/25/2018 15:20   Dg Chest Port 1 View  Result Date: 10/26/2018 CLINICAL DATA:  73 year old female with respiratory failure EXAM: PORTABLE CHEST 1 VIEW COMPARISON:  Prior chest x-ray 10/24/2018 FINDINGS: The patient remains intubated. The tip of the endotracheal  tube is 3 cm above the carina. The patient remains rotated toward the right which distorts the cardiac and mediastinal contours. However, there has been no interval change in the configuration to suggest an acute abnormality. A gastric tube remains present. The tip of the tube is not visualized but lies inferiorly off the field of view, presumably within the stomach. A left upper extremity PICC remains in place. The catheter tip overlies the upper SVC. Overall, slightly lower inspiratory volumes on today's exam, particularly in the right lung base suggesting an increase in the degree of atelectasis. Otherwise, the background of patchy right perihilar and right lower lobe airspace opacities, small bilateral layering pleural effusions and diffuse mild interstitial prominence are all similar. No  evidence of pneumothorax. No acute osseous abnormality. IMPRESSION: 1. Slightly decreased lung volumes, particularly in the right base commensurate with a slight increase in atelectasis. 2. Otherwise, no significant interval change in the appearance of the chest. 3. Stable and satisfactory support apparatus. Electronically Signed   By: Malachy MoanHeath  McCullough M.D.   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 Acute on chronic respiratory failure with hypoxia remains on full support time.  Continue pulmonary toilet and secretion management.  Currently requiring FiO2 of 40% 2. Cardiac arrest rhythm stable 3. Aspiration pneumonia treated continue antibiotics 4. Atrial fibrillation rate controlled cardiology is following 5. Chronic congestive heart failure with diastolic dysfunction continue to monitor fluid status 6. Severe COPD continue present management    I have personally seen and evaluated the patient, evaluated laboratory and imaging results, formulated the assessment and plan and placed orders. The Patient requires high complexity decision making for assessment and support.  Case was discussed on Rounds with the Respiratory Therapy Staff  Yevonne PaxSaadat A , MD [facility] Pulmonary Critical Care Medicine Sleep Medicine

## 2018-10-27 DIAGNOSIS — J9621 Acute and chronic respiratory failure with hypoxia: Secondary | ICD-10-CM | POA: Diagnosis not present

## 2018-10-27 DIAGNOSIS — I4891 Unspecified atrial fibrillation: Secondary | ICD-10-CM | POA: Diagnosis not present

## 2018-10-27 DIAGNOSIS — I5032 Chronic diastolic (congestive) heart failure: Secondary | ICD-10-CM | POA: Diagnosis not present

## 2018-10-27 DIAGNOSIS — J690 Pneumonitis due to inhalation of food and vomit: Secondary | ICD-10-CM | POA: Diagnosis not present

## 2018-10-27 LAB — BASIC METABOLIC PANEL
Anion gap: 14 (ref 5–15)
BUN: 43 mg/dL — ABNORMAL HIGH (ref 8–23)
CO2: 30 mmol/L (ref 22–32)
Calcium: 7.8 mg/dL — ABNORMAL LOW (ref 8.9–10.3)
Chloride: 88 mmol/L — ABNORMAL LOW (ref 98–111)
Creatinine, Ser: 1.24 mg/dL — ABNORMAL HIGH (ref 0.44–1.00)
GFR calc Af Amer: 51 mL/min — ABNORMAL LOW (ref >60)
GFR calc non Af Amer: 44 mL/min — ABNORMAL LOW (ref >60)
Glucose, Bld: 223 mg/dL — ABNORMAL HIGH (ref 70–99)
Potassium: 6 mmol/L — ABNORMAL HIGH (ref 3.5–5.1)
Sodium: 132 mmol/L — ABNORMAL LOW (ref 135–145)

## 2018-10-27 LAB — PHOSPHORUS: Phosphorus: 4.7 mg/dL — ABNORMAL HIGH (ref 2.5–4.6)

## 2018-10-27 LAB — CBC
HCT: 19.5 % — ABNORMAL LOW (ref 36.0–46.0)
HCT: 22.9 % — ABNORMAL LOW (ref 36.0–46.0)
Hemoglobin: 6.1 g/dL — CL (ref 12.0–15.0)
Hemoglobin: 7.5 g/dL — ABNORMAL LOW (ref 12.0–15.0)
MCH: 26 pg (ref 26.0–34.0)
MCH: 27.7 pg (ref 26.0–34.0)
MCHC: 31.3 g/dL (ref 30.0–36.0)
MCHC: 32.8 g/dL (ref 30.0–36.0)
MCV: 83 fL (ref 80.0–100.0)
MCV: 84.5 fL (ref 80.0–100.0)
Platelets: 189 10*3/uL (ref 150–400)
Platelets: 186 10*3/uL (ref 150–400)
RBC: 2.35 MIL/uL — ABNORMAL LOW (ref 3.87–5.11)
RBC: 2.71 MIL/uL — ABNORMAL LOW (ref 3.87–5.11)
RDW: 17.3 % — ABNORMAL HIGH (ref 11.5–15.5)
RDW: 17.7 % — ABNORMAL HIGH (ref 11.5–15.5)
WBC: 23.2 10*3/uL — ABNORMAL HIGH (ref 4.0–10.5)
WBC: 25.8 10*3/uL — ABNORMAL HIGH (ref 4.0–10.5)
nRBC: 0.2 % (ref 0.0–0.2)
nRBC: 0.2 % (ref 0.0–0.2)

## 2018-10-27 LAB — HEMOGLOBIN AND HEMATOCRIT, BLOOD
HCT: 19.1 % — ABNORMAL LOW (ref 36.0–46.0)
Hemoglobin: 6.1 g/dL — CL (ref 12.0–15.0)

## 2018-10-27 LAB — PREPARE RBC (CROSSMATCH)

## 2018-10-27 LAB — POTASSIUM: Potassium: 3.7 mmol/L (ref 3.5–5.1)

## 2018-10-27 LAB — MAGNESIUM: Magnesium: 1.7 mg/dL (ref 1.7–2.4)

## 2018-10-27 NOTE — Progress Notes (Addendum)
[facility] [place]   PULMONARY CRITICAL CARE SERVICE  PROGRESS NOTE  Date of Service: 10/27/2018  Shannon NianSheila Lang  ZOX:[id number]  DOB: 07/30/1945   DOA: 10/17/2018  Referring Physician: Carron CurieAli Hijazi, MD  HPI: Shannon NianSheila Lang is a 73 y.o. female seen for follow up of Acute on Chronic Respiratory Failure.  Patient remains on ventilator support.  No acute distress noted at this time and satting well.   Medications: Reviewed on Rounds  Physical Exam:  Vitals: Pulse 58 respirations 24 BP 145/69 O2 sat 98% temp 98.9  Ventilator Settings ventilator mode AC VC rate of 18 IP 25 PEEP of 5 FiO2 45%  . General: Comfortable at this time . Eyes: Grossly normal lids, irises & conjunctiva . ENT: grossly tongue is normal . Neck: no obvious mass . Cardiovascular: S1 S2 normal no gallop . Respiratory: Coarse breath sounds . Abdomen: soft . Skin: no rash seen on limited exam . Musculoskeletal: not rigid . Psychiatric:unable to assess . Neurologic: no seizure no involuntary movements         Lab Data:   Basic Metabolic Panel: Recent Labs  Lab 10/21/18 0750 10/22/18 0645 10/22/18 1110 10/24/18 0522 10/26/18 0548 10/27/18 0809 10/27/18 1139  NA 142  --   --  137 132* 132*  --   K 2.7* 3.4*  --  4.2 3.3* 6.0* 3.7  CL 88*  --   --  82* 84* 88*  --   CO2 46*  --   --  49* 37* 30  --   GLUCOSE 228*  --   --  224* 344* 223*  --   BUN 10  --   --  26* 38* 43*  --   CREATININE 0.68  --   --  1.02* 1.28* 1.24*  --   CALCIUM 8.5*  --   --  8.3* 8.0* 7.8*  --   MG 1.5*  --  1.8 1.6*  --  1.7  --   PHOS 2.9  --   --  1.5*  --  4.7*  --     ABG: Recent Labs  Lab 10/22/18 1358 10/23/18 1200 10/23/18 1356 10/24/18 0435 10/26/18 0605  PHART 7.341* 7.112* 7.262* 7.579* 7.556*  PCO2ART 89.7* CRITICAL RESULT CALLED TO, READ BACK BY AND VERIFIED WITH: 95.0* 52.1* 42.8  PO2ART 86.6 53.2* 46.2* 196* 62.0*  HCO3 47.2* 43.5* 41.4* 48.2* 38.0*  O2SAT  96.3 70.5 72.6 99.9 91.9    Liver Function Tests: No results for input(s): AST, ALT, ALKPHOS, BILITOT, PROT, ALBUMIN in the last 168 hours. No results for input(s): LIPASE, AMYLASE in the last 168 hours. No results for input(s): AMMONIA in the last 168 hours.  CBC: Recent Labs  Lab 10/24/18 0522 10/26/18 0548 10/27/18 0809 10/27/18 1139  WBC 20.5* 19.4*  --  23.2*  HGB 7.3* 6.0* 6.1* 6.1*  HCT 23.7* 20.1* 19.1* 19.5*  MCV 84.9 82.0  --  83.0  PLT 336 202  --  189    Cardiac Enzymes: No results for input(s): CKTOTAL, CKMB, CKMBINDEX, TROPONINI in the last 168 hours.  BNP (last 3 results) No results for input(s): BNP in the last 8760 hours.  ProBNP (last 3 results) No results for input(s): PROBNP in the last 8760 hours.  Radiological Exams: Dg Chest Port 1 View  Result Date: 10/26/2018 CLINICAL DATA:  73 year old female with respiratory failure EXAM: PORTABLE CHEST 1 VIEW COMPARISON:  Prior chest x-ray 10/24/2018 FINDINGS: The patient remains intubated. The  tip of the endotracheal tube is 3 cm above the carina. The patient remains rotated toward the right which distorts the cardiac and mediastinal contours. However, there has been no interval change in the configuration to suggest an acute abnormality. A gastric tube remains present. The tip of the tube is not visualized but lies inferiorly off the field of view, presumably within the stomach. A left upper extremity PICC remains in place. The catheter tip overlies the upper SVC. Overall, slightly lower inspiratory volumes on today's exam, particularly in the right lung base suggesting an increase in the degree of atelectasis. Otherwise, the background of patchy right perihilar and right lower lobe airspace opacities, small bilateral layering pleural effusions and diffuse mild interstitial prominence are all similar. No evidence of pneumothorax. No acute osseous abnormality. IMPRESSION: 1. Slightly decreased lung volumes, particularly  in the right base commensurate with a slight increase in atelectasis. 2. Otherwise, no significant interval change in the appearance of the chest. 3. Stable and satisfactory support apparatus. Electronically Signed   By: Malachy Moan M.D.   On: 10/26/2018 07:57    Assessment/Plan Active Problems:   Acute on chronic respiratory failure with hypoxia (HCC)   Aspiration pneumonia due to gastric secretions (HCC)   Atrial fibrillation with RVR (HCC)   Chronic congestive heart failure with left ventricular diastolic dysfunction (HCC)   COPD, severe (HCC)   1. Acute chronic respiratory failure with hypoxia remains on support at this time.  Continue aggressive pulmonary toilet and secretion management.  Currently requiring 45% FiO2. 2. Cardiac arrest rhythm stable 3. Aspiration pneumonia treated continue antibiotic 4. Atrial fibrillation rate controlled 5. Chronic congestive heart failure with diastolic dysfunction continue to monitor fluid status 6. Severe COPD continue present management   I have personally seen and evaluated the patient, evaluated laboratory and imaging results, formulated the assessment and plan and placed orders. The Patient requires high complexity decision making for assessment and support.  Case was discussed on Rounds with the Respiratory Therapy Staff  Yevonne Pax, MD [facility] Pulmonary Critical Care Medicine Sleep Medicine

## 2018-10-28 ENCOUNTER — Other Ambulatory Visit (HOSPITAL_COMMUNITY): Payer: Medicare Other

## 2018-10-28 DIAGNOSIS — J690 Pneumonitis due to inhalation of food and vomit: Secondary | ICD-10-CM | POA: Diagnosis not present

## 2018-10-28 DIAGNOSIS — I5032 Chronic diastolic (congestive) heart failure: Secondary | ICD-10-CM | POA: Diagnosis not present

## 2018-10-28 DIAGNOSIS — I4891 Unspecified atrial fibrillation: Secondary | ICD-10-CM | POA: Diagnosis not present

## 2018-10-28 DIAGNOSIS — J9621 Acute and chronic respiratory failure with hypoxia: Secondary | ICD-10-CM | POA: Diagnosis not present

## 2018-10-28 LAB — BASIC METABOLIC PANEL
Anion gap: 17 — ABNORMAL HIGH (ref 5–15)
BUN: 41 mg/dL — ABNORMAL HIGH (ref 8–23)
CO2: 30 mmol/L (ref 22–32)
Calcium: 8 mg/dL — ABNORMAL LOW (ref 8.9–10.3)
Chloride: 88 mmol/L — ABNORMAL LOW (ref 98–111)
Creatinine, Ser: 1.27 mg/dL — ABNORMAL HIGH (ref 0.44–1.00)
GFR calc Af Amer: 50 mL/min — ABNORMAL LOW (ref >60)
GFR calc non Af Amer: 43 mL/min — ABNORMAL LOW (ref >60)
Glucose, Bld: 262 mg/dL — ABNORMAL HIGH (ref 70–99)
Potassium: 3.8 mmol/L (ref 3.5–5.1)
Sodium: 135 mmol/L (ref 135–145)

## 2018-10-28 LAB — CBC
HCT: 23.9 % — ABNORMAL LOW (ref 36.0–46.0)
HCT: 22.1 % — ABNORMAL LOW (ref 36.0–46.0)
Hemoglobin: 7.5 g/dL — ABNORMAL LOW (ref 12.0–15.0)
Hemoglobin: 6.9 g/dL — CL (ref 12.0–15.0)
MCH: 26.7 pg (ref 26.0–34.0)
MCH: 26.5 pg (ref 26.0–34.0)
MCHC: 31.4 g/dL (ref 30.0–36.0)
MCHC: 31.2 g/dL (ref 30.0–36.0)
MCV: 85.1 fL (ref 80.0–100.0)
MCV: 85 fL (ref 80.0–100.0)
Platelets: 170 10*3/uL (ref 150–400)
Platelets: 215 10*3/uL (ref 150–400)
RBC: 2.81 MIL/uL — ABNORMAL LOW (ref 3.87–5.11)
RBC: 2.6 MIL/uL — ABNORMAL LOW (ref 3.87–5.11)
RDW: 17.8 % — ABNORMAL HIGH (ref 11.5–15.5)
RDW: 17.8 % — ABNORMAL HIGH (ref 11.5–15.5)
WBC: 24.2 10*3/uL — ABNORMAL HIGH (ref 4.0–10.5)
WBC: 25.2 10*3/uL — ABNORMAL HIGH (ref 4.0–10.5)
nRBC: 0.2 % (ref 0.0–0.2)
nRBC: 0.2 % (ref 0.0–0.2)

## 2018-10-28 LAB — CK: Total CK: 65 U/L (ref 38–234)

## 2018-10-28 LAB — PREPARE RBC (CROSSMATCH)

## 2018-10-28 LAB — TRIGLYCERIDES: Triglycerides: 288 mg/dL — ABNORMAL HIGH (ref <150)

## 2018-10-28 LAB — LACTIC ACID, PLASMA: Lactic Acid, Venous: 1.1 mmol/L (ref 0.5–1.9)

## 2018-10-28 LAB — PROCALCITONIN: Procalcitonin: 1.92 ng/mL

## 2018-10-28 NOTE — Progress Notes (Addendum)
Pulmonary Critical Care Medicine [facility] [place]   PULMONARY CRITICAL CARE SERVICE  PROGRESS NOTE  Date of Service: 10/28/2018  Shannon NianSheila Lang  ZOX:[id number]  DOB: 06/30/1946   DOA: 10/17/2018  Referring Physician: Carron CurieAli Hijazi, MD  HPI: Shannon NianSheila Lang is a 73 y.o. female seen for follow up of Acute on Chronic Respiratory Failure.  Patient was able to tolerate 2 hours on pressure support yesterday.  Patient remains on multiple drips of medication.  Patient will rest today on the ventilator.  Medications: Reviewed on Rounds  Physical Exam:  Vitals: Pulse 67 respirations 18 BP 131/47 O2 sat 97% temp 98.5  Ventilator Settings ventilator mode AC PC rate of 18 IP 25 PEEP of 5 FiO2 45%  . General: Comfortable at this time . Eyes: Grossly normal lids, irises & conjunctiva . ENT: grossly tongue is normal . Neck: no obvious mass . Cardiovascular: S1 S2 normal no gallop . Respiratory: Coarse breath sounds . Abdomen: soft . Skin: no rash seen on limited exam . Musculoskeletal: not rigid . Psychiatric:unable to assess . Neurologic: no seizure no involuntary movements         Lab Data:   Basic Metabolic Panel: Recent Labs  Lab 10/22/18 1110 10/24/18 0522 10/26/18 0548 10/27/18 0809 10/27/18 1139 10/28/18 0555  NA  --  137 132* 132*  --  135  K  --  4.2 3.3* 6.0* 3.7 3.8  CL  --  82* 84* 88*  --  88*  CO2  --  49* 37* 30  --  30  GLUCOSE  --  224* 344* 223*  --  262*  BUN  --  26* 38* 43*  --  41*  CREATININE  --  1.02* 1.28* 1.24*  --  1.27*  CALCIUM  --  8.3* 8.0* 7.8*  --  8.0*  MG 1.8 1.6*  --  1.7  --   --   PHOS  --  1.5*  --  4.7*  --   --     ABG: Recent Labs  Lab 10/22/18 1358 10/23/18 1200 10/23/18 1356 10/24/18 0435 10/26/18 0605  PHART 7.341* 7.112* 7.262* 7.579* 7.556*  PCO2ART 89.7* CRITICAL RESULT CALLED TO, READ BACK BY AND VERIFIED WITH: 95.0* 52.1* 42.8  PO2ART 86.6 53.2* 46.2* 196* 62.0*  HCO3 47.2* 43.5* 41.4* 48.2* 38.0*   O2SAT 96.3 70.5 72.6 99.9 91.9    Liver Function Tests: No results for input(s): AST, ALT, ALKPHOS, BILITOT, PROT, ALBUMIN in the last 168 hours. No results for input(s): LIPASE, AMYLASE in the last 168 hours. No results for input(s): AMMONIA in the last 168 hours.  CBC: Recent Labs  Lab 10/26/18 0548 10/27/18 0809 10/27/18 1139 10/27/18 2317 10/28/18 0555 10/28/18 0831  WBC 19.4*  --  23.2* 25.8* 24.2* 25.2*  HGB 6.0* 6.1* 6.1* 7.5* 7.5* 6.9*  HCT 20.1* 19.1* 19.5* 22.9* 23.9* 22.1*  MCV 82.0  --  83.0 84.5 85.1 85.0  PLT 202  --  189 186 170 215    Cardiac Enzymes: Recent Labs  Lab 10/28/18 1144  CKTOTAL 65    BNP (last 3 results) No results for input(s): BNP in the last 8760 hours.  ProBNP (last 3 results) No results for input(s): PROBNP in the last 8760 hours.  Radiological Exams: Dg Chest Port 1 View  Result Date: 10/28/2018 CLINICAL DATA:  73 year old female with worsening respiratory status. EXAM: PORTABLE CHEST 1 VIEW COMPARISON:  10/26/2018 and earlier. FINDINGS: Portable AP semi upright view at 1211 hours. Stable  endotracheal tube tip just below the clavicles. Enteric tube courses to the left upper quadrant, tip not included. Patient is less rotated today. Stable left PICC line. Stable lung volumes. Stable cardiac size and mediastinal contours. No pneumothorax. Streaky bilateral perihilar and infrahilar opacity. Ventilation in the right lung appears stable to mildly improved since 10/24/2018. However left lung opacity has mildly increased. No definite pleural effusion. Paucity of bowel gas in the upper abdomen. IMPRESSION: 1.  Stable lines and tubes. 2. Nonspecific streaky perihilar and lung base opacity. Right lung ventilation appears mildly improved since 10/24/2018 while left lung ventilation has mildly worsened. Electronically Signed   By: Odessa Fleming M.D.   On: 10/28/2018 11:45    Assessment/Plan Active Problems:   Acute on chronic respiratory failure with  hypoxia (HCC)   Aspiration pneumonia due to gastric secretions (HCC)   Atrial fibrillation with RVR (HCC)   Chronic congestive heart failure with left ventricular diastolic dysfunction (HCC)   COPD, severe (HCC)   1. Acute chronic respiratory failure with hypoxia patient remains on full support at this time.  Continue secretion management aggressive pulmonary toilet.  Currently requiring 45% FiO2. 2. Cardiac arrest rhythm stable 3. Aspiration pneumonia treated continue antibiotic therapy 4. Atrial fibrillation rate controlled 5. Chronic congestive heart failure diastolic dysfunction continue to monitor fluid as 6. Severe COPD continue present management   I have personally seen and evaluated the patient, evaluated laboratory and imaging results, formulated the assessment and plan and placed orders. The Patient requires high complexity decision making for assessment and support.  Case was discussed on Rounds with the Respiratory Therapy Staff  Yevonne Pax, MD [facility] Pulmonary Critical Care Medicine Sleep Medicine

## 2018-10-29 DIAGNOSIS — J9621 Acute and chronic respiratory failure with hypoxia: Secondary | ICD-10-CM | POA: Diagnosis not present

## 2018-10-29 DIAGNOSIS — I4891 Unspecified atrial fibrillation: Secondary | ICD-10-CM | POA: Diagnosis not present

## 2018-10-29 DIAGNOSIS — I5032 Chronic diastolic (congestive) heart failure: Secondary | ICD-10-CM | POA: Diagnosis not present

## 2018-10-29 DIAGNOSIS — J690 Pneumonitis due to inhalation of food and vomit: Secondary | ICD-10-CM | POA: Diagnosis not present

## 2018-10-29 LAB — BPAM RBC
Blood Product Expiration Date: 202005012359
Blood Product Expiration Date: 202005012359
Blood Product Expiration Date: 202005012359
ISSUE DATE / TIME: 202004191346
ISSUE DATE / TIME: 202004201343
ISSUE DATE / TIME: 202004211137
Unit Type and Rh: 5100
Unit Type and Rh: 5100
Unit Type and Rh: 5100

## 2018-10-29 LAB — BASIC METABOLIC PANEL
Anion gap: 12 (ref 5–15)
BUN: 37 mg/dL — ABNORMAL HIGH (ref 8–23)
CO2: 34 mmol/L — ABNORMAL HIGH (ref 22–32)
Calcium: 8.2 mg/dL — ABNORMAL LOW (ref 8.9–10.3)
Chloride: 88 mmol/L — ABNORMAL LOW (ref 98–111)
Creatinine, Ser: 1.28 mg/dL — ABNORMAL HIGH (ref 0.44–1.00)
GFR calc Af Amer: 49 mL/min — ABNORMAL LOW (ref >60)
GFR calc non Af Amer: 42 mL/min — ABNORMAL LOW (ref >60)
Glucose, Bld: 201 mg/dL — ABNORMAL HIGH (ref 70–99)
Potassium: 4 mmol/L (ref 3.5–5.1)
Sodium: 134 mmol/L — ABNORMAL LOW (ref 135–145)

## 2018-10-29 LAB — TYPE AND SCREEN
ABO/RH(D): O POS
Antibody Screen: NEGATIVE
Unit division: 0
Unit division: 0
Unit division: 0

## 2018-10-29 LAB — CBC
HCT: 25.9 % — ABNORMAL LOW (ref 36.0–46.0)
Hemoglobin: 8.2 g/dL — ABNORMAL LOW (ref 12.0–15.0)
MCH: 27.1 pg (ref 26.0–34.0)
MCHC: 31.7 g/dL (ref 30.0–36.0)
MCV: 85.5 fL (ref 80.0–100.0)
Platelets: 231 10*3/uL (ref 150–400)
RBC: 3.03 MIL/uL — ABNORMAL LOW (ref 3.87–5.11)
RDW: 17.7 % — ABNORMAL HIGH (ref 11.5–15.5)
WBC: 25.5 10*3/uL — ABNORMAL HIGH (ref 4.0–10.5)
nRBC: 0 % (ref 0.0–0.2)

## 2018-10-29 NOTE — Progress Notes (Addendum)
Pulmonary Critical Care Medicine [facility] [place]   PULMONARY CRITICAL CARE SERVICE  PROGRESS NOTE  Date of Service: 10/29/2018  Shannon Lang  [id number]  DOB: October 10, 1945   DOA: 10/17/2018  Referring Physician: Carron Curie, MD  HPI: Shannon Lang is a 73 y.o. female seen for follow up of Acute on Chronic Respiratory Failure.  Patient remains on full support on ventilator.  Currently requiring 45% FiO2.  No fever or distress noted at this time.  Medications: Reviewed on Rounds  Physical Exam:  Vitals: Pulse 66 respirations 20 BP 123/55 O2 sat 97% temp 98.0  Ventilator Settings ventilator mode AC PC rate of 18 inspiratory pressure 25 PEEP of 5 FiO2 45%  . General: Comfortable at this time . Eyes: Grossly normal lids, irises & conjunctiva . ENT: grossly tongue is normal . Neck: no obvious mass . Cardiovascular: S1 S2 normal no gallop . Respiratory: Coarse breath sounds . Abdomen: soft . Skin: no rash seen on limited exam . Musculoskeletal: not rigid . Psychiatric:unable to assess . Neurologic: no seizure no involuntary movements         Lab Data:   Basic Metabolic Panel: Recent Labs  Lab 10/24/18 0522 10/26/18 0548 10/27/18 0809 10/27/18 1139 10/28/18 0555 10/29/18 0513  NA 137 132* 132*  --  135 134*  K 4.2 3.3* 6.0* 3.7 3.8 4.0  CL 82* 84* 88*  --  88* 88*  CO2 49* 37* 30  --  30 34*  GLUCOSE 224* 344* 223*  --  262* 201*  BUN 26* 38* 43*  --  41* 37*  CREATININE 1.02* 1.28* 1.24*  --  1.27* 1.28*  CALCIUM 8.3* 8.0* 7.8*  --  8.0* 8.2*  MG 1.6*  --  1.7  --   --   --   PHOS 1.5*  --  4.7*  --   --   --     ABG: Recent Labs  Lab 10/22/18 1358 10/23/18 1200 10/23/18 1356 10/24/18 0435 10/26/18 0605  PHART 7.341* 7.112* 7.262* 7.579* 7.556*  PCO2ART 89.7* CRITICAL RESULT CALLED TO, READ BACK BY AND VERIFIED WITH: 95.0* 52.1* 42.8  PO2ART 86.6 53.2* 46.2* 196* 62.0*  HCO3 47.2* 43.5* 41.4* 48.2* 38.0*  O2SAT 96.3 70.5 72.6 99.9 91.9     Liver Function Tests: No results for input(s): AST, ALT, ALKPHOS, BILITOT, PROT, ALBUMIN in the last 168 hours. No results for input(s): LIPASE, AMYLASE in the last 168 hours. No results for input(s): AMMONIA in the last 168 hours.  CBC: Recent Labs  Lab 10/27/18 1139 10/27/18 2317 10/28/18 0555 10/28/18 0831 10/29/18 0513  WBC 23.2* 25.8* 24.2* 25.2* 25.5*  HGB 6.1* 7.5* 7.5* 6.9* 8.2*  HCT 19.5* 22.9* 23.9* 22.1* 25.9*  MCV 83.0 84.5 85.1 85.0 85.5  PLT 189 186 170 215 231    Cardiac Enzymes: Recent Labs  Lab 10/28/18 1144  CKTOTAL 65    BNP (last 3 results) No results for input(s): BNP in the last 8760 hours.  ProBNP (last 3 results) No results for input(s): PROBNP in the last 8760 hours.  Radiological Exams: Dg Chest Port 1 View  Result Date: 10/28/2018 CLINICAL DATA:  73 year old female with worsening respiratory status. EXAM: PORTABLE CHEST 1 VIEW COMPARISON:  10/26/2018 and earlier. FINDINGS: Portable AP semi upright view at 1211 hours. Stable endotracheal tube tip just below the clavicles. Enteric tube courses to the left upper quadrant, tip not included. Patient is less rotated today. Stable left PICC line. Stable lung volumes. Stable cardiac  size and mediastinal contours. No pneumothorax. Streaky bilateral perihilar and infrahilar opacity. Ventilation in the right lung appears stable to mildly improved since 10/24/2018. However left lung opacity has mildly increased. No definite pleural effusion. Paucity of bowel gas in the upper abdomen. IMPRESSION: 1.  Stable lines and tubes. 2. Nonspecific streaky perihilar and lung base opacity. Right lung ventilation appears mildly improved since 10/24/2018 while left lung ventilation has mildly worsened. Electronically Signed   By: Odessa FlemingH  Hall M.D.   On: 10/28/2018 11:45    Assessment/Plan Active Problems:   Acute on chronic respiratory failure with hypoxia (HCC)   Aspiration pneumonia due to gastric secretions (HCC)    Atrial fibrillation with RVR (HCC)   Chronic congestive heart failure with left ventricular diastolic dysfunction (HCC)   COPD, severe (HCC)   1. Acute on chronic respiratory failure with hypoxia patient remains on full support.  Continue supportive measures as well as secretion management and pulmonary toilet.  Currently requiring 45% FiO2. 2. Cardiac arrest rhythm stable 3. Aspiration pneumonia treated continue antibiotic therapy 4. Atrial fibrillation rate controlled 5. Chronic congestive heart failure diastolic dysfunction continue to monitor fluids 6. Severe COPD continue present management   I have personally seen and evaluated the patient, evaluated laboratory and imaging results, formulated the assessment and plan and placed orders. The Patient requires high complexity decision making for assessment and support.  Case was discussed on Rounds with the Respiratory Therapy Staff  Yevonne PaxSaadat A Shawan Corella, MD [facility] Pulmonary Critical Care Medicine Sleep Medicine

## 2018-10-30 DIAGNOSIS — I4891 Unspecified atrial fibrillation: Secondary | ICD-10-CM | POA: Diagnosis not present

## 2018-10-30 DIAGNOSIS — J9621 Acute and chronic respiratory failure with hypoxia: Secondary | ICD-10-CM | POA: Diagnosis not present

## 2018-10-30 DIAGNOSIS — J690 Pneumonitis due to inhalation of food and vomit: Secondary | ICD-10-CM | POA: Diagnosis not present

## 2018-10-30 DIAGNOSIS — I5032 Chronic diastolic (congestive) heart failure: Secondary | ICD-10-CM | POA: Diagnosis not present

## 2018-10-30 LAB — BASIC METABOLIC PANEL
Anion gap: 11 (ref 5–15)
Anion gap: 10 (ref 5–15)
BUN: 42 mg/dL — ABNORMAL HIGH (ref 8–23)
BUN: 41 mg/dL — ABNORMAL HIGH (ref 8–23)
CO2: 32 mmol/L (ref 22–32)
CO2: 37 mmol/L — ABNORMAL HIGH (ref 22–32)
Calcium: 8 mg/dL — ABNORMAL LOW (ref 8.9–10.3)
Calcium: 8.2 mg/dL — ABNORMAL LOW (ref 8.9–10.3)
Chloride: 91 mmol/L — ABNORMAL LOW (ref 98–111)
Chloride: 91 mmol/L — ABNORMAL LOW (ref 98–111)
Creatinine, Ser: 1.61 mg/dL — ABNORMAL HIGH (ref 0.44–1.00)
Creatinine, Ser: 1.46 mg/dL — ABNORMAL HIGH (ref 0.44–1.00)
GFR calc Af Amer: 37 mL/min — ABNORMAL LOW (ref >60)
GFR calc Af Amer: 42 mL/min — ABNORMAL LOW (ref >60)
GFR calc non Af Amer: 32 mL/min — ABNORMAL LOW (ref >60)
GFR calc non Af Amer: 36 mL/min — ABNORMAL LOW (ref >60)
Glucose, Bld: 112 mg/dL — ABNORMAL HIGH (ref 70–99)
Glucose, Bld: 119 mg/dL — ABNORMAL HIGH (ref 70–99)
Potassium: 5.6 mmol/L — ABNORMAL HIGH (ref 3.5–5.1)
Potassium: 4 mmol/L (ref 3.5–5.1)
Sodium: 134 mmol/L — ABNORMAL LOW (ref 135–145)
Sodium: 138 mmol/L (ref 135–145)

## 2018-10-30 LAB — PHOSPHORUS: Phosphorus: 5.6 mg/dL — ABNORMAL HIGH (ref 2.5–4.6)

## 2018-10-30 LAB — MAGNESIUM: Magnesium: 3.4 mg/dL — ABNORMAL HIGH (ref 1.7–2.4)

## 2018-10-30 NOTE — Progress Notes (Addendum)
[facility] [place]   PULMONARY CRITICAL CARE SERVICE  PROGRESS NOTE  Date of Service: 10/30/2018  Shannon Lang  [id number]  DOB: 09/10/1945   DOA: 10/17/2018  Referring Physician: Carron CurieAli Hijazi, MD  HPI: Shannon Lang is a 73 y.o. female seen for follow up of Acute on Chronic Respiratory Failure.  Patient remains on full support on the ventilator rate of 18 currently requiring 45% FiO2.  Has failed 2 attempts at PSVT today due to decreased tidal volumes and increased respiratory rate.  Resting comfortably on the vent.  Medications: Reviewed on Rounds  Physical Exam:  Vitals: Pulse 64 respirations 18 BP 96/45 O2 sat 95% temp 97.8  Ventilator Settings ventilator mode AC PC rate of 18 inspiratory pressure of 25 PEEP of 5 FiO2 45%  . General: Comfortable at this time . Eyes: Grossly normal lids, irises & conjunctiva . ENT: grossly tongue is normal . Neck: no obvious mass . Cardiovascular: S1 S2 normal no gallop . Respiratory: Coarse breath sounds . Abdomen: soft . Skin: no rash seen on limited exam . Musculoskeletal: not rigid . Psychiatric:unable to assess . Neurologic: no seizure no involuntary movements         Lab Data:   Basic Metabolic Panel: Recent Labs  Lab 10/24/18 0522  10/27/18 0809 10/27/18 1139 10/28/18 0555 10/29/18 0513 10/30/18 0508 10/30/18 1049  NA 137   < > 132*  --  135 134* 134* 138  K 4.2   < > 6.0* 3.7 3.8 4.0 5.6* 4.0  CL 82*   < > 88*  --  88* 88* 91* 91*  CO2 49*   < > 30  --  30 34* 32 37*  GLUCOSE 224*   < > 223*  --  262* 201* 112* 119*  BUN 26*   < > 43*  --  41* 37* 42* 41*  CREATININE 1.02*   < > 1.24*  --  1.27* 1.28* 1.61* 1.46*  CALCIUM 8.3*   < > 7.8*  --  8.0* 8.2* 8.0* 8.2*  MG 1.6*  --  1.7  --   --   --  3.4*  --   PHOS 1.5*  --  4.7*  --   --   --  5.6*  --    < > = values in this interval not displayed.    ABG: Recent Labs  Lab 10/24/18 0435 10/26/18 0605  PHART  7.579* 7.556*  PCO2ART 52.1* 42.8  PO2ART 196* 62.0*  HCO3 48.2* 38.0*  O2SAT 99.9 91.9    Liver Function Tests: No results for input(s): AST, ALT, ALKPHOS, BILITOT, PROT, ALBUMIN in the last 168 hours. No results for input(s): LIPASE, AMYLASE in the last 168 hours. No results for input(s): AMMONIA in the last 168 hours.  CBC: Recent Labs  Lab 10/27/18 1139 10/27/18 2317 10/28/18 0555 10/28/18 0831 10/29/18 0513  WBC 23.2* 25.8* 24.2* 25.2* 25.5*  HGB 6.1* 7.5* 7.5* 6.9* 8.2*  HCT 19.5* 22.9* 23.9* 22.1* 25.9*  MCV 83.0 84.5 85.1 85.0 85.5  PLT 189 186 170 215 231    Cardiac Enzymes: Recent Labs  Lab 10/28/18 1144  CKTOTAL 65    BNP (last 3 results) No results for input(s): BNP in the last 8760 hours.  ProBNP (last 3 results) No results for input(s): PROBNP in the last 8760 hours.  Radiological Exams: No results found.  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 Acute on chronic respiratory failure with hypoxia patient remains on full support.  Has failed 2 attempts at pressure support currently.  Continue pulmonary toilet and secretion management as well as supportive measures.  Currently requiring 45% FiO2. 2. Cardiac arrest rhythm is stable 3. Aspiration pneumonia treated continue antibiotic therapy 4. Atrial fibrillation rate controlled 5. Chronic congestive heart failure diastolic dysfunction continue to monitor fluid status 6. Severe COPD continue present management   I have personally seen and evaluated the patient, evaluated laboratory and imaging results, formulated the assessment and plan and placed orders. The Patient requires high complexity decision making for assessment and support.  Case was  discussed on Rounds with the Respiratory Therapy Staff  Yevonne Pax, MD [facility] Pulmonary Critical Care Medicine Sleep Medicine

## 2018-10-31 DIAGNOSIS — J690 Pneumonitis due to inhalation of food and vomit: Secondary | ICD-10-CM | POA: Diagnosis not present

## 2018-10-31 DIAGNOSIS — I4891 Unspecified atrial fibrillation: Secondary | ICD-10-CM | POA: Diagnosis not present

## 2018-10-31 DIAGNOSIS — J9621 Acute and chronic respiratory failure with hypoxia: Secondary | ICD-10-CM | POA: Diagnosis not present

## 2018-10-31 DIAGNOSIS — I5032 Chronic diastolic (congestive) heart failure: Secondary | ICD-10-CM | POA: Diagnosis not present

## 2018-10-31 LAB — CBC
HCT: 22.7 % — ABNORMAL LOW (ref 36.0–46.0)
Hemoglobin: 6.9 g/dL — CL (ref 12.0–15.0)
MCH: 26.8 pg (ref 26.0–34.0)
MCHC: 30.4 g/dL (ref 30.0–36.0)
MCV: 88.3 fL (ref 80.0–100.0)
Platelets: 270 10*3/uL (ref 150–400)
RBC: 2.57 MIL/uL — ABNORMAL LOW (ref 3.87–5.11)
RDW: 19.1 % — ABNORMAL HIGH (ref 11.5–15.5)
WBC: 12.1 10*3/uL — ABNORMAL HIGH (ref 4.0–10.5)
nRBC: 0 % (ref 0.0–0.2)

## 2018-10-31 LAB — BASIC METABOLIC PANEL
Anion gap: 11 (ref 5–15)
BUN: 42 mg/dL — ABNORMAL HIGH (ref 8–23)
CO2: 35 mmol/L — ABNORMAL HIGH (ref 22–32)
Calcium: 8.1 mg/dL — ABNORMAL LOW (ref 8.9–10.3)
Chloride: 91 mmol/L — ABNORMAL LOW (ref 98–111)
Creatinine, Ser: 1.47 mg/dL — ABNORMAL HIGH (ref 0.44–1.00)
GFR calc Af Amer: 41 mL/min — ABNORMAL LOW (ref >60)
GFR calc non Af Amer: 36 mL/min — ABNORMAL LOW (ref >60)
Glucose, Bld: 96 mg/dL (ref 70–99)
Potassium: 3.9 mmol/L (ref 3.5–5.1)
Sodium: 137 mmol/L (ref 135–145)

## 2018-10-31 LAB — PREPARE RBC (CROSSMATCH)

## 2018-10-31 NOTE — Progress Notes (Addendum)
Pulmonary Critical Care Medicine [facility] [place]   PULMONARY CRITICAL CARE SERVICE  PROGRESS NOTE  Date of Service: 10/31/2018  Shannon Lang  MWU:[id number]  DOB: May 14, 1946   DOA: 10/17/2018  Referring Physician: Carron Curie, MD  HPI: Shannon Lang is a 73 y.o. female seen for follow up of Acute on Chronic Respiratory Failure.  Patient failed pressure support trial today becoming tachypneic with increased work of breathing.  Currently resting on ventilator with no distress at this time.  Medications: Reviewed on Rounds  Physical Exam:  Vitals: Pulse 61 respirations 18 BP 99/40 O2 sat 95% temp 98.9  Ventilator Settings ventilator mode AC PC rate of 18 inspiratory pressure 25 PEEP of 5 FiO2 40%  . General: Comfortable at this time . Eyes: Grossly normal lids, irises & conjunctiva . ENT: grossly tongue is normal . Neck: no obvious mass . Cardiovascular: S1 S2 normal no gallop . Respiratory: Coarse breath sounds . Abdomen: soft . Skin: no rash seen on limited exam . Musculoskeletal: not rigid . Psychiatric:unable to assess . Neurologic: no seizure no involuntary movements         Lab Data:   Basic Metabolic Panel: Recent Labs  Lab 10/27/18 0809  10/28/18 0555 10/29/18 0513 10/30/18 0508 10/30/18 1049 10/31/18 0431  NA 132*  --  135 134* 134* 138 137  K 6.0*   < > 3.8 4.0 5.6* 4.0 3.9  CL 88*  --  88* 88* 91* 91* 91*  CO2 30  --  30 34* 32 37* 35*  GLUCOSE 223*  --  262* 201* 112* 119* 96  BUN 43*  --  41* 37* 42* 41* 42*  CREATININE 1.24*  --  1.27* 1.28* 1.61* 1.46* 1.47*  CALCIUM 7.8*  --  8.0* 8.2* 8.0* 8.2* 8.1*  MG 1.7  --   --   --  3.4*  --   --   PHOS 4.7*  --   --   --  5.6*  --   --    < > = values in this interval not displayed.    ABG: Recent Labs  Lab 10/26/18 0605  PHART 7.556*  PCO2ART 42.8  PO2ART 62.0*  HCO3 38.0*  O2SAT 91.9    Liver Function Tests: No results for input(s): AST, ALT, ALKPHOS, BILITOT, PROT, ALBUMIN  in the last 168 hours. No results for input(s): LIPASE, AMYLASE in the last 168 hours. No results for input(s): AMMONIA in the last 168 hours.  CBC: Recent Labs  Lab 10/27/18 2317 10/28/18 0555 10/28/18 0831 10/29/18 0513 10/31/18 0431  WBC 25.8* 24.2* 25.2* 25.5* 12.1*  HGB 7.5* 7.5* 6.9* 8.2* 6.9*  HCT 22.9* 23.9* 22.1* 25.9* 22.7*  MCV 84.5 85.1 85.0 85.5 88.3  PLT 186 170 215 231 270    Cardiac Enzymes: Recent Labs  Lab 10/28/18 1144  CKTOTAL 65    BNP (last 3 results) No results for input(s): BNP in the last 8760 hours.  ProBNP (last 3 results) No results for input(s): PROBNP in the last 8760 hours.  Radiological Exams: No results found.  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 Acute on chronic respiratory failure with hypoxia patient remains on full support at this time as she failed weaning attempts down to  pressure support today.  Continue aggressive pulmonary toilet and secretion management.  Currently requiring an FiO2 of 40%. 2. Cardiac arrest rhythm stable 3. Aspiration pneumonia treated continue antibiotic therapy 4. Atrial fibrillation rate controlled 5. Chronic congestive heart failure diastolic dysfunction continue to monitor fluid status 6. Severe COPD continue present management   I have personally seen and evaluated the patient, evaluated laboratory and imaging results, formulated the assessment and plan and placed orders. The Patient requires high complexity decision making for assessment and support.  Case was discussed on Rounds with the Respiratory Therapy Staff  Yevonne PaxSaadat A Khan, MD [facility] Pulmonary Critical Care Medicine Sleep Medicine

## 2018-11-01 DIAGNOSIS — I4891 Unspecified atrial fibrillation: Secondary | ICD-10-CM | POA: Diagnosis not present

## 2018-11-01 DIAGNOSIS — J690 Pneumonitis due to inhalation of food and vomit: Secondary | ICD-10-CM | POA: Diagnosis not present

## 2018-11-01 DIAGNOSIS — J9621 Acute and chronic respiratory failure with hypoxia: Secondary | ICD-10-CM | POA: Diagnosis not present

## 2018-11-01 DIAGNOSIS — I5032 Chronic diastolic (congestive) heart failure: Secondary | ICD-10-CM | POA: Diagnosis not present

## 2018-11-01 LAB — CBC
HCT: 22.1 % — ABNORMAL LOW (ref 36.0–46.0)
Hemoglobin: 6.8 g/dL — CL (ref 12.0–15.0)
MCH: 27.2 pg (ref 26.0–34.0)
MCHC: 30.8 g/dL (ref 30.0–36.0)
MCV: 88.4 fL (ref 80.0–100.0)
Platelets: 334 10*3/uL (ref 150–400)
RBC: 2.5 MIL/uL — ABNORMAL LOW (ref 3.87–5.11)
RDW: 19.1 % — ABNORMAL HIGH (ref 11.5–15.5)
WBC: 9.1 10*3/uL (ref 4.0–10.5)
nRBC: 0 % (ref 0.0–0.2)

## 2018-11-01 LAB — PREPARE RBC (CROSSMATCH)

## 2018-11-01 NOTE — Progress Notes (Addendum)
Pulmonary Critical Care Medicine [facility] [place]   PULMONARY CRITICAL CARE SERVICE  PROGRESS NOTE  Date of Service: 11/01/2018  Shannon Lang  [id number]  DOB: [birth year]   DOA: 10/17/2018  Referring Physician: Carron Curie, MD  HPI: Shannon Lang is a 73 y.o. female seen for follow up of Acute on Chronic Respiratory Failure.  Patient is able to tolerate 2 hours on pressure support 16/5.  Currently resting on full support with a rate of 18 and FiO2 40%.  Medications: Reviewed on Rounds  Physical Exam:  Vitals: Pulse 65 respirations 18 BP 100/54 O2 sat 95% temp 98.5  Ventilator Settings ventilator mode AC PC rate of 18 inspiratory pressure 25 PEEP 5 FiO2 40%  . General: Comfortable at this time . Eyes: Grossly normal lids, irises & conjunctiva . ENT: grossly tongue is normal . Neck: no obvious mass . Cardiovascular: S1 S2 normal no gallop . Respiratory: Coarse breath sounds . Abdomen: soft . Skin: no rash seen on limited exam . Musculoskeletal: not rigid . Psychiatric:unable to assess . Neurologic: no seizure no involuntary movements         Lab Data:   Basic Metabolic Panel: Recent Labs  Lab 10/27/18 0809  10/28/18 0555 10/29/18 0513 10/30/18 0508 10/30/18 1049 10/31/18 0431  NA 132*  --  135 134* 134* 138 137  K 6.0*   < > 3.8 4.0 5.6* 4.0 3.9  CL 88*  --  88* 88* 91* 91* 91*  CO2 30  --  30 34* 32 37* 35*  GLUCOSE 223*  --  262* 201* 112* 119* 96  BUN 43*  --  41* 37* 42* 41* 42*  CREATININE 1.24*  --  1.27* 1.28* 1.61* 1.46* 1.47*  CALCIUM 7.8*  --  8.0* 8.2* 8.0* 8.2* 8.1*  MG 1.7  --   --   --  3.4*  --   --   PHOS 4.7*  --   --   --  5.6*  --   --    < > = values in this interval not displayed.    ABG: Recent Labs  Lab 10/26/18 0605  PHART 7.556*  PCO2ART 42.8  PO2ART 62.0*  HCO3 38.0*  O2SAT 91.9    Liver Function Tests: No results for input(s): AST, ALT, ALKPHOS, BILITOT, PROT, ALBUMIN in the last 168 hours. No results  for input(s): LIPASE, AMYLASE in the last 168 hours. No results for input(s): AMMONIA in the last 168 hours.  CBC: Recent Labs  Lab 10/28/18 0555 10/28/18 0831 10/29/18 0513 10/31/18 0431 11/01/18 1203  WBC 24.2* 25.2* 25.5* 12.1* 9.1  HGB 7.5* 6.9* 8.2* 6.9* 6.8*  HCT 23.9* 22.1* 25.9* 22.7* 22.1*  MCV 85.1 85.0 85.5 88.3 88.4  PLT 170 215 231 270 334    Cardiac Enzymes: Recent Labs  Lab 10/28/18 1144  CKTOTAL 65    BNP (last 3 results) No results for input(s): BNP in the last 8760 hours.  ProBNP (last 3 results) No results for input(s): PROBNP in the last 8760 hours.  Radiological Exams: No results found.  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 Acute on chronic respiratory failure with hypoxia patient remains on the ventilator at this time.  We will continue weaning as tolerated  per protocol.  Continue pulmonary toilet and secretion management.  Currently requiring 40% FiO2. 2. Cardiac arrest rhythm stable 3. Aspiration pneumonia treated continue antibiotic therapy 4. Atrial fibrillation rate controlled 5. Chronic congestive heart failure diastolic dysfunction continue to monitor fluid status 6. Severe COPD continue present management   I have personally seen and evaluated the patient, evaluated laboratory and imaging results, formulated the assessment and plan and placed orders. The Patient requires high complexity decision making for assessment and support.  Case was discussed on Rounds with the Respiratory Therapy Staff  Yevonne PaxSaadat A Khan, MD [facility] Pulmonary Critical Care Medicine Sleep Medicine

## 2018-11-02 DIAGNOSIS — I4891 Unspecified atrial fibrillation: Secondary | ICD-10-CM | POA: Diagnosis not present

## 2018-11-02 DIAGNOSIS — J9621 Acute and chronic respiratory failure with hypoxia: Secondary | ICD-10-CM | POA: Diagnosis not present

## 2018-11-02 DIAGNOSIS — I5032 Chronic diastolic (congestive) heart failure: Secondary | ICD-10-CM | POA: Diagnosis not present

## 2018-11-02 DIAGNOSIS — J690 Pneumonitis due to inhalation of food and vomit: Secondary | ICD-10-CM | POA: Diagnosis not present

## 2018-11-02 LAB — BPAM RBC
Blood Product Expiration Date: 202005012359
Blood Product Expiration Date: 202005012359
ISSUE DATE / TIME: 202004241641
ISSUE DATE / TIME: 202004251335
Unit Type and Rh: 5100
Unit Type and Rh: 5100

## 2018-11-02 LAB — CULTURE, BLOOD (ROUTINE X 2)
Culture: NO GROWTH
Culture: NO GROWTH
Special Requests: ADEQUATE
Special Requests: ADEQUATE

## 2018-11-02 LAB — BASIC METABOLIC PANEL
Anion gap: 12 (ref 5–15)
BUN: 40 mg/dL — ABNORMAL HIGH (ref 8–23)
CO2: 34 mmol/L — ABNORMAL HIGH (ref 22–32)
Calcium: 8.2 mg/dL — ABNORMAL LOW (ref 8.9–10.3)
Chloride: 92 mmol/L — ABNORMAL LOW (ref 98–111)
Creatinine, Ser: 1.47 mg/dL — ABNORMAL HIGH (ref 0.44–1.00)
GFR calc Af Amer: 41 mL/min — ABNORMAL LOW (ref >60)
GFR calc non Af Amer: 36 mL/min — ABNORMAL LOW (ref >60)
Glucose, Bld: 114 mg/dL — ABNORMAL HIGH (ref 70–99)
Potassium: 4 mmol/L (ref 3.5–5.1)
Sodium: 138 mmol/L (ref 135–145)

## 2018-11-02 LAB — CBC
HCT: 25.1 % — ABNORMAL LOW (ref 36.0–46.0)
Hemoglobin: 7.9 g/dL — ABNORMAL LOW (ref 12.0–15.0)
MCH: 27.9 pg (ref 26.0–34.0)
MCHC: 31.5 g/dL (ref 30.0–36.0)
MCV: 88.7 fL (ref 80.0–100.0)
Platelets: 372 10*3/uL (ref 150–400)
RBC: 2.83 MIL/uL — ABNORMAL LOW (ref 3.87–5.11)
RDW: 18.2 % — ABNORMAL HIGH (ref 11.5–15.5)
WBC: 9.2 10*3/uL (ref 4.0–10.5)
nRBC: 0 % (ref 0.0–0.2)

## 2018-11-02 LAB — TYPE AND SCREEN
ABO/RH(D): O POS
Antibody Screen: NEGATIVE
Unit division: 0
Unit division: 0

## 2018-11-02 LAB — PHOSPHORUS: Phosphorus: 4.6 mg/dL (ref 2.5–4.6)

## 2018-11-02 LAB — MAGNESIUM: Magnesium: 2 mg/dL (ref 1.7–2.4)

## 2018-11-02 NOTE — Progress Notes (Addendum)
Pulmonary Critical Care Medicine [facility] [place]   PULMONARY CRITICAL CARE SERVICE  PROGRESS NOTE  Date of Service: 11/02/2018  Akeiba Lubrano  VOZ:[id number]  DOB: 19-Dec-1945   DOA: 10/17/2018  Referring Physician: Carron Curie, MD  HPI: Shannon Lang is a 73 y.o. female seen for follow up of Acute on Chronic Respiratory Failure.  Patient once again failed to wean today on pressure support of 16/5.  Respiratory reports she instantly desaturated to the low 80s.  Remains on the ventilator requiring 45% FiO2 at this time.  Medications: Reviewed on Rounds  Physical Exam:  Vitals: Pulse 52 respirations 14 BP 91/34 O2 sat 95% temp 98.7  Ventilator Settings ventilator mode AC PC rate of 18 inspiratory pressure 25 PEEP of 5 FiO2 45%  . General: Comfortable at this time . Eyes: Grossly normal lids, irises & conjunctiva . ENT: grossly tongue is normal . Neck: no obvious mass . Cardiovascular: S1 S2 normal no gallop . Respiratory: Coarse breath sounds . Abdomen: soft . Skin: no rash seen on limited exam . Musculoskeletal: not rigid . Psychiatric:unable to assess . Neurologic: no seizure no involuntary movements         Lab Data:   Basic Metabolic Panel: Recent Labs  Lab 10/27/18 0809  10/29/18 0513 10/30/18 0508 10/30/18 1049 10/31/18 0431 11/02/18 0426  NA 132*   < > 134* 134* 138 137 138  K 6.0*   < > 4.0 5.6* 4.0 3.9 4.0  CL 88*   < > 88* 91* 91* 91* 92*  CO2 30   < > 34* 32 37* 35* 34*  GLUCOSE 223*   < > 201* 112* 119* 96 114*  BUN 43*   < > 37* 42* 41* 42* 40*  CREATININE 1.24*   < > 1.28* 1.61* 1.46* 1.47* 1.47*  CALCIUM 7.8*   < > 8.2* 8.0* 8.2* 8.1* 8.2*  MG 1.7  --   --  3.4*  --   --  2.0  PHOS 4.7*  --   --  5.6*  --   --  4.6   < > = values in this interval not displayed.    ABG: No results for input(s): PHART, PCO2ART, PO2ART, HCO3, O2SAT in the last 168 hours.  Liver Function Tests: No results for input(s): AST, ALT, ALKPHOS, BILITOT,  PROT, ALBUMIN in the last 168 hours. No results for input(s): LIPASE, AMYLASE in the last 168 hours. No results for input(s): AMMONIA in the last 168 hours.  CBC: Recent Labs  Lab 10/28/18 0831 10/29/18 0513 10/31/18 0431 11/01/18 1203 11/02/18 0426  WBC 25.2* 25.5* 12.1* 9.1 9.2  HGB 6.9* 8.2* 6.9* 6.8* 7.9*  HCT 22.1* 25.9* 22.7* 22.1* 25.1*  MCV 85.0 85.5 88.3 88.4 88.7  PLT 215 231 270 334 372    Cardiac Enzymes: Recent Labs  Lab 10/28/18 1144  CKTOTAL 65    BNP (last 3 results) No results for input(s): BNP in the last 8760 hours.  ProBNP (last 3 results) No results for input(s): PROBNP in the last 8760 hours.  Radiological Exams: No results found.  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 Acute on chronic respiratory failure with hypoxia patient remains on ventilator at this time failed weaning again today.  We will continue to attempt weaning.  Continue secretion management and pulmonary toilet as well supportive measures.  Currently FiO2 was increased to 45%. 2. Cardiac arrest rhythm stable 3. Aspiration pneumonia treated continue antibiotic therapy 4. Atrial fibrillation rate controlled 5. Chronic congestive heart failure diastolic dysfunction continue to monitor fluid status 6. Severe COPD continue present management   I have personally seen and evaluated the patient, evaluated laboratory and imaging results, formulated the assessment and plan and placed orders. The Patient requires high complexity decision making for assessment and support.  Case was discussed on Rounds with the Respiratory Therapy Staff  Yevonne PaxSaadat A Khan, MD [facility] Pulmonary Critical Care Medicine Sleep Medicine \

## 2018-11-03 DIAGNOSIS — I5032 Chronic diastolic (congestive) heart failure: Secondary | ICD-10-CM | POA: Diagnosis not present

## 2018-11-03 DIAGNOSIS — J9621 Acute and chronic respiratory failure with hypoxia: Secondary | ICD-10-CM | POA: Diagnosis not present

## 2018-11-03 DIAGNOSIS — I4891 Unspecified atrial fibrillation: Secondary | ICD-10-CM | POA: Diagnosis not present

## 2018-11-03 DIAGNOSIS — J690 Pneumonitis due to inhalation of food and vomit: Secondary | ICD-10-CM | POA: Diagnosis not present

## 2018-11-03 NOTE — Progress Notes (Addendum)
Pulmonary Critical Care Medicine [facility] [place]   PULMONARY CRITICAL CARE SERVICE  PROGRESS NOTE  Date of Service: 11/03/2018  Shannon Lang  OVZ:[id number]  DOB: 1946-05-08   DOA: 10/17/2018  Referring Physician: Carron Curie, MD  HPI: Shannon Lang is a 73 y.o. female seen for follow up of Acute on Chronic Respiratory Failure.  Patient once again failed pressure support weaning today.  An ENT consult was placed for trach placement.  Remains on full support on the ventilator.  Medications: Reviewed on Rounds  Physical Exam:  Vitals: Pulse 60 respirations 22 BP 133/69 O2 sat 99% temp 99.6  Ventilator Settings ventilator mode AC PC rate of 18 inspiratory pressure 25 PEEP of 5 FiO2 45%  . General: Comfortable at this time . Eyes: Grossly normal lids, irises & conjunctiva . ENT: grossly tongue is normal . Neck: no obvious mass . Cardiovascular: S1 S2 normal no gallop . Respiratory: Coarse breath sounds. . Abdomen: soft . Skin: no rash seen on limited exam . Musculoskeletal: not rigid . Psychiatric:unable to assess . Neurologic: no seizure no involuntary movements         Lab Data:   Basic Metabolic Panel: Recent Labs  Lab 10/29/18 0513 10/30/18 0508 10/30/18 1049 10/31/18 0431 11/02/18 0426  NA 134* 134* 138 137 138  K 4.0 5.6* 4.0 3.9 4.0  CL 88* 91* 91* 91* 92*  CO2 34* 32 37* 35* 34*  GLUCOSE 201* 112* 119* 96 114*  BUN 37* 42* 41* 42* 40*  CREATININE 1.28* 1.61* 1.46* 1.47* 1.47*  CALCIUM 8.2* 8.0* 8.2* 8.1* 8.2*  MG  --  3.4*  --   --  2.0  PHOS  --  5.6*  --   --  4.6    ABG: No results for input(s): PHART, PCO2ART, PO2ART, HCO3, O2SAT in the last 168 hours.  Liver Function Tests: No results for input(s): AST, ALT, ALKPHOS, BILITOT, PROT, ALBUMIN in the last 168 hours. No results for input(s): LIPASE, AMYLASE in the last 168 hours. No results for input(s): AMMONIA in the last 168 hours.  CBC: Recent Labs  Lab 10/28/18 0831  10/29/18 0513 10/31/18 0431 11/01/18 1203 11/02/18 0426  WBC 25.2* 25.5* 12.1* 9.1 9.2  HGB 6.9* 8.2* 6.9* 6.8* 7.9*  HCT 22.1* 25.9* 22.7* 22.1* 25.1*  MCV 85.0 85.5 88.3 88.4 88.7  PLT 215 231 270 334 372    Cardiac Enzymes: Recent Labs  Lab 10/28/18 1144  CKTOTAL 65    BNP (last 3 results) No results for input(s): BNP in the last 8760 hours.  ProBNP (last 3 results) No results for input(s): PROBNP in the last 8760 hours.  Radiological Exams: No results found.  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 Acute on chronic respiratory failure with hypoxia patient mains on ventilator at this time once again failing to wean to pressure support.  ENT consult placed for trach placement.  Continue pulmonary toilet and secretion management 2. Cardiac arrest rhythm stable 3. Aspiration pneumonia treated continue antibiotic therapy 4. Atrial fibrillation rate controlled 5. Chronic congestive heart failure diastolic dysfunction continue to monitor fluid status 6. Severe COPD continue present management   I have personally seen and evaluated the patient, evaluated laboratory and imaging results, formulated the assessment  and plan and placed orders. The Patient requires high complexity decision making for assessment and support.  Case was discussed on Rounds with the Respiratory Therapy Staff  Allyne Gee, MD [facility] Pulmonary Critical Care Medicine Sleep Medicine

## 2018-11-04 DIAGNOSIS — J690 Pneumonitis due to inhalation of food and vomit: Secondary | ICD-10-CM | POA: Diagnosis not present

## 2018-11-04 DIAGNOSIS — J9621 Acute and chronic respiratory failure with hypoxia: Secondary | ICD-10-CM | POA: Diagnosis not present

## 2018-11-04 DIAGNOSIS — I5032 Chronic diastolic (congestive) heart failure: Secondary | ICD-10-CM | POA: Diagnosis not present

## 2018-11-04 DIAGNOSIS — I4891 Unspecified atrial fibrillation: Secondary | ICD-10-CM | POA: Diagnosis not present

## 2018-11-04 LAB — TRIGLYCERIDES: Triglycerides: 161 mg/dL — ABNORMAL HIGH (ref <150)

## 2018-11-04 NOTE — Progress Notes (Addendum)
Pulmonary Critical Care Medicine [facility] [place]   PULMONARY CRITICAL CARE SERVICE  PROGRESS NOTE  Date of Service: 11/04/2018  Shannon Lang  PVV:[id number]  DOB: 25-Jan-1946   DOA: 10/17/2018  Referring Physician: Carron Curie, MD  HPI: Shannon Lang is a 73 y.o. female seen for follow up of Acute on Chronic Respiratory Failure.  Patient remains on a assist control mode on the ventilator.  She remains orally intubated trach  Medications: Reviewed on Rounds  Physical Exam:  Vitals: Pulse 54 respirations 38 BP 109/55 O2 sat 96% temp 97.7  Ventilator Settings ventilator mode AC PC rate of 18 respiratory pressure 25 PEEP 5 FiO2 40  . General: Comfortable at this time . Eyes: Grossly normal lids, irises & conjunctiva . ENT: grossly tongue is normal . Neck: no obvious mass . Cardiovascular: S1 S2 normal no gallop . Respiratory: Coarse breath sounds . Abdomen: soft . Skin: no rash seen on limited exam . Musculoskeletal: not rigid . Psychiatric:unable to assess . Neurologic: no seizure no involuntary movements         Lab Data:   Basic Metabolic Panel: Recent Labs  Lab 10/29/18 0513 10/30/18 0508 10/30/18 1049 10/31/18 0431 11/02/18 0426  NA 134* 134* 138 137 138  K 4.0 5.6* 4.0 3.9 4.0  CL 88* 91* 91* 91* 92*  CO2 34* 32 37* 35* 34*  GLUCOSE 201* 112* 119* 96 114*  BUN 37* 42* 41* 42* 40*  CREATININE 1.28* 1.61* 1.46* 1.47* 1.47*  CALCIUM 8.2* 8.0* 8.2* 8.1* 8.2*  MG  --  3.4*  --   --  2.0  PHOS  --  5.6*  --   --  4.6    ABG: No results for input(s): PHART, PCO2ART, PO2ART, HCO3, O2SAT in the last 168 hours.  Liver Function Tests: No results for input(s): AST, ALT, ALKPHOS, BILITOT, PROT, ALBUMIN in the last 168 hours. No results for input(s): LIPASE, AMYLASE in the last 168 hours. No results for input(s): AMMONIA in the last 168 hours.  CBC: Recent Labs  Lab 10/29/18 0513 10/31/18 0431 11/01/18 1203 11/02/18 0426  WBC 25.5* 12.1* 9.1  9.2  HGB 8.2* 6.9* 6.8* 7.9*  HCT 25.9* 22.7* 22.1* 25.1*  MCV 85.5 88.3 88.4 88.7  PLT 231 270 334 372    Cardiac Enzymes: No results for input(s): CKTOTAL, CKMB, CKMBINDEX, TROPONINI in the last 168 hours.  BNP (last 3 results) No results for input(s): BNP in the last 8760 hours.  ProBNP (last 3 results) No results for input(s): PROBNP in the last 8760 hours.  Radiological Exams: No results found.  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 Acute on chronic respiratory failure with hypoxia patient remains on ventilator at this time on assist control mode.  Continue weaning as tolerated.  Patient will be seen by ENT for trach placement. 2. Cardiac arrest with stable aspiration pneumonia treated continue antibiotic therapy 3. Atrial fibrillation rate control 4. Chronic congestive heart failure diastolic distention continue to monitor fluid status 5. Severe COPD continue present management   I have personally seen and evaluated the patient, evaluated laboratory and imaging results, formulated the assessment and plan and placed orders. The Patient requires high complexity decision making for assessment and support.  Case was  discussed on Rounds with the Respiratory Therapy Staff  Allyne Gee, MD [facility] Pulmonary Critical Care Medicine Sleep Medicine

## 2018-11-05 DIAGNOSIS — I5032 Chronic diastolic (congestive) heart failure: Secondary | ICD-10-CM | POA: Diagnosis not present

## 2018-11-05 DIAGNOSIS — I4891 Unspecified atrial fibrillation: Secondary | ICD-10-CM | POA: Diagnosis not present

## 2018-11-05 DIAGNOSIS — J690 Pneumonitis due to inhalation of food and vomit: Secondary | ICD-10-CM | POA: Diagnosis not present

## 2018-11-05 DIAGNOSIS — J9621 Acute and chronic respiratory failure with hypoxia: Secondary | ICD-10-CM | POA: Diagnosis not present

## 2018-11-05 LAB — CBC
HCT: 28.2 % — ABNORMAL LOW (ref 36.0–46.0)
Hemoglobin: 8.6 g/dL — ABNORMAL LOW (ref 12.0–15.0)
MCH: 27.7 pg (ref 26.0–34.0)
MCHC: 30.5 g/dL (ref 30.0–36.0)
MCV: 90.7 fL (ref 80.0–100.0)
Platelets: 493 10*3/uL — ABNORMAL HIGH (ref 150–400)
RBC: 3.11 MIL/uL — ABNORMAL LOW (ref 3.87–5.11)
RDW: 18.2 % — ABNORMAL HIGH (ref 11.5–15.5)
WBC: 10.5 10*3/uL (ref 4.0–10.5)
nRBC: 0 % (ref 0.0–0.2)

## 2018-11-05 LAB — BASIC METABOLIC PANEL
Anion gap: 11 (ref 5–15)
BUN: 31 mg/dL — ABNORMAL HIGH (ref 8–23)
CO2: 36 mmol/L — ABNORMAL HIGH (ref 22–32)
Calcium: 8.8 mg/dL — ABNORMAL LOW (ref 8.9–10.3)
Chloride: 91 mmol/L — ABNORMAL LOW (ref 98–111)
Creatinine, Ser: 1.51 mg/dL — ABNORMAL HIGH (ref 0.44–1.00)
GFR calc Af Amer: 40 mL/min — ABNORMAL LOW (ref >60)
GFR calc non Af Amer: 35 mL/min — ABNORMAL LOW (ref >60)
Glucose, Bld: 83 mg/dL (ref 70–99)
Potassium: 3.9 mmol/L (ref 3.5–5.1)
Sodium: 138 mmol/L (ref 135–145)

## 2018-11-05 LAB — TSH: TSH: 4.598 u[IU]/mL — ABNORMAL HIGH (ref 0.350–4.500)

## 2018-11-05 LAB — MAGNESIUM: Magnesium: 2.3 mg/dL (ref 1.7–2.4)

## 2018-11-05 LAB — PHOSPHORUS: Phosphorus: 5.2 mg/dL — ABNORMAL HIGH (ref 2.5–4.6)

## 2018-11-05 NOTE — Progress Notes (Signed)
Pulmonary Critical Care Medicine [facility] [place]   PULMONARY CRITICAL CARE SERVICE  PROGRESS NOTE  Date of Service: 11/05/2018  Shannon Lang  NZV:[id number]  DOB: Jun 08, 1946   DOA: 10/17/2018  Referring Physician: Carron Curie, MD  HPI: Shannon Lang is a 73 y.o. female seen for follow up of Acute on Chronic Respiratory Failure.  Patient remains orally intubated scheduled for tracheostomy in the morning.  Right now is on 40% FiO2.  Was attempted at weaning failed multiple times  Medications: Reviewed on Rounds  Physical Exam:  Vitals: Temperature 97.6 pulse 50 respiratory 20 blood pressure 118/73 saturations 100%  Ventilator Settings mode ventilation pressure assist control FiO2 40% tidal volume 457 PEEP 5  . General: Comfortable at this time . Eyes: Grossly normal lids, irises & conjunctiva . ENT: grossly tongue is normal . Neck: no obvious mass . Cardiovascular: S1 S2 normal no gallop . Respiratory: Coarse breath sounds no rhonchi are noted at this time . Abdomen: soft . Skin: no rash seen on limited exam . Musculoskeletal: not rigid . Psychiatric:unable to assess . Neurologic: no seizure no involuntary movements         Lab Data:   Basic Metabolic Panel: Recent Labs  Lab 10/30/18 0508 10/30/18 1049 10/31/18 0431 11/02/18 0426 11/05/18 0459  NA 134* 138 137 138 138  K 5.6* 4.0 3.9 4.0 3.9  CL 91* 91* 91* 92* 91*  CO2 32 37* 35* 34* 36*  GLUCOSE 112* 119* 96 114* 83  BUN 42* 41* 42* 40* 31*  CREATININE 1.61* 1.46* 1.47* 1.47* 1.51*  CALCIUM 8.0* 8.2* 8.1* 8.2* 8.8*  MG 3.4*  --   --  2.0 2.3  PHOS 5.6*  --   --  4.6 5.2*    ABG: No results for input(s): PHART, PCO2ART, PO2ART, HCO3, O2SAT in the last 168 hours.  Liver Function Tests: No results for input(s): AST, ALT, ALKPHOS, BILITOT, PROT, ALBUMIN in the last 168 hours. No results for input(s): LIPASE, AMYLASE in the last 168 hours. No results for input(s): AMMONIA in the last 168  hours.  CBC: Recent Labs  Lab 10/31/18 0431 11/01/18 1203 11/02/18 0426 11/05/18 1431  WBC 12.1* 9.1 9.2 10.5  HGB 6.9* 6.8* 7.9* 8.6*  HCT 22.7* 22.1* 25.1* 28.2*  MCV 88.3 88.4 88.7 90.7  PLT 270 334 372 493*    Cardiac Enzymes: No results for input(s): CKTOTAL, CKMB, CKMBINDEX, TROPONINI in the last 168 hours.  BNP (last 3 results) No results for input(s): BNP in the last 8760 hours.  ProBNP (last 3 results) No results for input(s): PROBNP in the last 8760 hours.  Radiological Exams: No results found.  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 Acute on chronic respiratory failure with hypoxia we will continue with full support on pressure control mode patient still requiring 40% FiO2.  Hopefully once the tracheostomy is done we may be able to advance weaning 2. Aspiration pneumonia treated clinically improving 3. S atrial fibrillation rate is controlled we will continue with supportive care 4. Chronic congestive heart failure diuretics as necessary 5. Severe COPD at baseline continue present management   I have personally seen and evaluated the patient, evaluated laboratory and imaging results, formulated the assessment and plan and placed orders.  The Patient requires high complexity decision making for assessment and support.  Case was discussed on Rounds with the Respiratory Therapy Staff  Allyne Gee, MD [facility] Pulmonary Critical Care Medicine Sleep Medicine

## 2018-11-06 ENCOUNTER — Inpatient Hospital Stay (HOSPITAL_COMMUNITY): Admission: AD | Admit: 2018-11-06 | Payer: Medicare Other | Source: Home / Self Care | Admitting: Otolaryngology

## 2018-11-06 ENCOUNTER — Encounter
Disposition: A | Discharge status: Skilled Nursing Facility | Payer: Self-pay | Source: Other Acute Inpatient Hospital | Attending: Internal Medicine

## 2018-11-06 ENCOUNTER — Encounter (HOSPITAL_COMMUNITY): Payer: Medicare Other | Admitting: Anesthesiology

## 2018-11-06 ENCOUNTER — Other Ambulatory Visit (HOSPITAL_COMMUNITY): Payer: Medicare Other

## 2018-11-06 DIAGNOSIS — J9621 Acute and chronic respiratory failure with hypoxia: Secondary | ICD-10-CM | POA: Diagnosis not present

## 2018-11-06 DIAGNOSIS — I4891 Unspecified atrial fibrillation: Secondary | ICD-10-CM | POA: Diagnosis not present

## 2018-11-06 DIAGNOSIS — I5032 Chronic diastolic (congestive) heart failure: Secondary | ICD-10-CM | POA: Diagnosis not present

## 2018-11-06 DIAGNOSIS — J690 Pneumonitis due to inhalation of food and vomit: Secondary | ICD-10-CM | POA: Diagnosis not present

## 2018-11-06 HISTORY — PX: TRACHEOSTOMY TUBE PLACEMENT: SHX814

## 2018-11-06 SURGERY — CREATION, TRACHEOSTOMY
Anesthesia: General | Site: Neck

## 2018-11-06 SURGERY — CREATION, TRACHEOSTOMY
Anesthesia: General

## 2018-11-06 MED ORDER — PROPOFOL 10 MG/ML IV BOLUS
INTRAVENOUS | Status: DC | PRN
  Administered 2018-11-06 (×2): 50 mg via INTRAVENOUS

## 2018-11-06 MED ORDER — FENTANYL CITRATE (PF) 100 MCG/2ML IJ SOLN
INTRAMUSCULAR | Status: DC | PRN
  Administered 2018-11-06 (×2): 50 ug via INTRAVENOUS

## 2018-11-06 MED ORDER — PROPOFOL 10 MG/ML IV BOLUS
INTRAVENOUS | Status: AC
  Filled 2018-11-06: qty 20

## 2018-11-06 MED ORDER — 0.9 % SODIUM CHLORIDE (POUR BTL) OPTIME
TOPICAL | Status: DC | PRN
  Administered 2018-11-06: 12:00:00 1000 mL

## 2018-11-06 MED ORDER — DEXAMETHASONE SODIUM PHOSPHATE 10 MG/ML IJ SOLN
INTRAMUSCULAR | Status: DC | PRN
  Administered 2018-11-06: 5 mg via INTRAVENOUS

## 2018-11-06 MED ORDER — ONDANSETRON HCL 4 MG/2ML IJ SOLN
INTRAMUSCULAR | Status: DC | PRN
  Administered 2018-11-06: 4 mg via INTRAVENOUS

## 2018-11-06 MED ORDER — LIDOCAINE-EPINEPHRINE 1 %-1:100000 IJ SOLN
INTRAMUSCULAR | Status: DC | PRN
  Administered 2018-11-06: 6 mL

## 2018-11-06 MED ORDER — PHENYLEPHRINE 40 MCG/ML (10ML) SYRINGE FOR IV PUSH (FOR BLOOD PRESSURE SUPPORT)
PREFILLED_SYRINGE | INTRAVENOUS | Status: AC
  Filled 2018-11-06: qty 10

## 2018-11-06 MED ORDER — EPHEDRINE SULFATE 50 MG/ML IJ SOLN
INTRAMUSCULAR | Status: DC | PRN
  Administered 2018-11-06: 10 mg via INTRAVENOUS

## 2018-11-06 MED ORDER — STERILE WATER FOR IRRIGATION IR SOLN
Status: DC | PRN
  Administered 2018-11-06: 1000 mL

## 2018-11-06 MED ORDER — EPHEDRINE 5 MG/ML INJ
INTRAVENOUS | Status: AC
  Filled 2018-11-06: qty 10

## 2018-11-06 MED ORDER — PHENYLEPHRINE HCL (PRESSORS) 10 MG/ML IV SOLN
INTRAVENOUS | Status: DC | PRN
  Administered 2018-11-06 (×3): 120 ug via INTRAVENOUS

## 2018-11-06 MED ORDER — LACTATED RINGERS IV SOLN
INTRAVENOUS | Status: DC | PRN
  Administered 2018-11-06: 11:00:00 via INTRAVENOUS

## 2018-11-06 MED ORDER — FENTANYL CITRATE (PF) 250 MCG/5ML IJ SOLN
INTRAMUSCULAR | Status: AC
  Filled 2018-11-06: qty 5

## 2018-11-06 MED ORDER — LIDOCAINE 2% (20 MG/ML) 5 ML SYRINGE
INTRAMUSCULAR | Status: DC | PRN
  Administered 2018-11-06: 50 mg via INTRAVENOUS

## 2018-11-06 SURGICAL SUPPLY — 40 items
BLADE SURG 15 STRL LF DISP TIS (BLADE) ×1 IMPLANT
BLADE SURG 15 STRL SS (BLADE) ×2
CLEANER TIP ELECTROSURG 2X2 (MISCELLANEOUS) ×3 IMPLANT
COVER SURGICAL LIGHT HANDLE (MISCELLANEOUS) ×3 IMPLANT
COVER WAND RF STERILE (DRAPES) ×3 IMPLANT
DRAPE HALF SHEET 40X57 (DRAPES) ×3 IMPLANT
ELECT COATED BLADE 2.86 ST (ELECTRODE) ×3 IMPLANT
ELECT REM PT RETURN 9FT ADLT (ELECTROSURGICAL) ×3
ELECTRODE REM PT RTRN 9FT ADLT (ELECTROSURGICAL) ×1 IMPLANT
GAUZE 4X4 16PLY RFD (DISPOSABLE) ×3 IMPLANT
GEL ULTRASOUND 20GR AQUASONIC (MISCELLANEOUS) ×3 IMPLANT
GLOVE SS BIOGEL STRL SZ 7.5 (GLOVE) ×1 IMPLANT
GLOVE SUPERSENSE BIOGEL SZ 7.5 (GLOVE) ×2
GOWN STRL REUS W/ TWL LRG LVL3 (GOWN DISPOSABLE) ×2 IMPLANT
GOWN STRL REUS W/ TWL XL LVL3 (GOWN DISPOSABLE) ×1 IMPLANT
GOWN STRL REUS W/TWL LRG LVL3 (GOWN DISPOSABLE) ×4
GOWN STRL REUS W/TWL XL LVL3 (GOWN DISPOSABLE) ×2
HOLDER TRACH TUBE VELCRO 19.5 (MISCELLANEOUS) ×3 IMPLANT
KIT BASIN OR (CUSTOM PROCEDURE TRAY) ×3 IMPLANT
KIT SUCTION CATH 14FR (SUCTIONS) ×3 IMPLANT
KIT TURNOVER KIT B (KITS) ×3 IMPLANT
NEEDLE HYPO 25GX1X1/2 BEV (NEEDLE) ×3 IMPLANT
NS IRRIG 1000ML POUR BTL (IV SOLUTION) ×3 IMPLANT
PACK EENT II TURBAN DRAPE (CUSTOM PROCEDURE TRAY) ×3 IMPLANT
PAD ARMBOARD 7.5X6 YLW CONV (MISCELLANEOUS) ×3 IMPLANT
PENCIL BUTTON HOLSTER BLD 10FT (ELECTRODE) ×3 IMPLANT
PENCIL SMOKE EVACUATOR (MISCELLANEOUS) ×3 IMPLANT
SPONGE DRAIN TRACH 4X4 STRL 2S (GAUZE/BANDAGES/DRESSINGS) ×3 IMPLANT
SPONGE INTESTINAL PEANUT (DISPOSABLE) ×3 IMPLANT
SUT SILK 2 0 SH CR/8 (SUTURE) ×3 IMPLANT
SUT SILK 3 0 TIES 10X30 (SUTURE) ×3 IMPLANT
SYR 10ML LL (SYRINGE) ×3 IMPLANT
SYR 5ML LUER SLIP (SYRINGE) ×3 IMPLANT
SYR BULB 3OZ (MISCELLANEOUS) ×3 IMPLANT
SYR CONTROL 10ML LL (SYRINGE) ×3 IMPLANT
TOWEL OR 17X24 6PK STRL BLUE (TOWEL DISPOSABLE) ×3 IMPLANT
TOWEL OR 17X26 10 PK STRL BLUE (TOWEL DISPOSABLE) ×3 IMPLANT
TUBE CONNECTING 12'X1/4 (SUCTIONS) ×1
TUBE CONNECTING 12X1/4 (SUCTIONS) ×2 IMPLANT
TUBE TRACH SHILEY  6 DIST  CUF (TUBING) ×3 IMPLANT

## 2018-11-06 NOTE — Interval H&P Note (Signed)
History and Physical Interval Note:  11/06/2018 11:09 AM  Shannon Lang  has presented today for surgery, with the diagnosis of respiratory failure.  The various methods of treatment have been discussed with the patient and family. After consideration of risks, benefits and other options for treatment, the patient has consented to  Procedure(s): TRACHEOSTOMY (N/A) as a surgical intervention.  The patient's history has been reviewed, patient examined, no change in status, stable for surgery.  I have reviewed the patient's chart and labs.  Questions were answered to the patient's satisfaction.     [name]

## 2018-11-06 NOTE — Op Note (Signed)
NAMEVERL, DEVINE MEDICAL RECORD [id number] ACCOUNT [id number] DATE OF BIRTH:March 05, 1946 FACILITY: [facility] LOCATION: [facility] PHYSICIAN:Anshika Pethtel Braxton Feathers, MD  OPERATIVE REPORT  DATE OF PROCEDURE:  11/06/2018  PREOPERATIVE DIAGNOSIS:  Acute on chronic respiratory failure.  POSTOPERATIVE DIAGNOSIS:  Acute on chronic respiratory failure.  OPERATION PERFORMED:  Tracheostomy with a #6 Shiley cuffed tube.  SURGEON:  [name], MD  ANESTHESIA:  General endotracheal.  ESTIMATED BLOOD LOSS:  Minimal.  COMPLICATIONS:  None.  BRIEF CLINICAL NOTE:  Shannon Lang is a 72 year old female that is staying in [facility].  She has bad COPD and has been on BiPAP previously.  She underwent arrest requiring CPR 2 weeks ago requiring intubation.  Attempts at extubation  and weaning have been unsuccessful and tracheostomy was recommended.  She is taken to the operating room this time for performing a tracheotomy.  DESCRIPTION OF PROCEDURE:  The patient was brought straight down from [facility] on the ventilator and intubated down to the OR.  Neck was extended.  Area of the trachea was palpated.  The area was injected with 5-6 mL of Xylocaine with epinephrine  for hemostasis and local anesthesia.  Area was then prepped with Betadine solution and draped out in sterile towels.  A vertical incision was made just below the cricoid cartilage.  Dissection was carried down to the trachea.  Strap muscles were divided  in the midline and retracted laterally.  Thyroid isthmus covered the upper 3 tracheal rings and this was divided with cautery.  After dividing the thyroid isthmus, the first 3 tracheal rings were exposed.  It was elected to perform a horizontal  tracheotomy with inferior based flap between the first and second tracheal rings.  This was created.  Endotracheal tube was removed and a #6 cuffed Shiley tube was inserted without any difficulty and the patient was  ventilated well.  This was  subsequently secured to the neck with 2-0 silk sutures x4 as well as a Velcro trach collar around the neck.  The OG tube was changed to a nasogastric tube and the patient was transferred back to [facility].  TN/NUANCE  D:11/06/2018 T:11/06/2018 JOB:[id number]

## 2018-11-06 NOTE — Progress Notes (Addendum)
Pulmonary Critical Care Medicine [facility] [place]   PULMONARY CRITICAL CARE SERVICE  PROGRESS NOTE  Date of Service: 11/06/2018  Shannon Lang  KMQ:[id number]  DOB: 1946/02/01   DOA: 10/17/2018  Referring Physician: Carron Curie, MD  HPI: Shannon Lang is a 73 y.o. female seen for follow up of Acute on Chronic Respiratory Failure.  Patient was trached today with a #6 Shiley.  Currently doing well on pressure control on the ventilator.  Medications: Reviewed on Rounds  Physical Exam:  Vitals: Pulse 55 respirations 30 BP 150/64 O2 sat 100% temp 97.4  Ventilator Settings ventilator mode pressure control rate of 18 expiratory pressure 25 PEEP of 5 FiO2 40%  . General: Comfortable at this time . Eyes: Grossly normal lids, irises & conjunctiva . ENT: grossly tongue is normal . Neck: no obvious mass . Cardiovascular: S1 S2 normal no gallop . Respiratory: Coarse breath sounds . Abdomen: soft . Skin: no rash seen on limited exam . Musculoskeletal: not rigid . Psychiatric:unable to assess . Neurologic: no seizure no involuntary movements         Lab Data:   Basic Metabolic Panel: Recent Labs  Lab 10/31/18 0431 11/02/18 0426 11/05/18 0459  NA 137 138 138  K 3.9 4.0 3.9  CL 91* 92* 91*  CO2 35* 34* 36*  GLUCOSE 96 114* 83  BUN 42* 40* 31*  CREATININE 1.47* 1.47* 1.51*  CALCIUM 8.1* 8.2* 8.8*  MG  --  2.0 2.3  PHOS  --  4.6 5.2*    ABG: No results for input(s): PHART, PCO2ART, PO2ART, HCO3, O2SAT in the last 168 hours.  Liver Function Tests: No results for input(s): AST, ALT, ALKPHOS, BILITOT, PROT, ALBUMIN in the last 168 hours. No results for input(s): LIPASE, AMYLASE in the last 168 hours. No results for input(s): AMMONIA in the last 168 hours.  CBC: Recent Labs  Lab 10/31/18 0431 11/01/18 1203 11/02/18 0426 11/05/18 1431  WBC 12.1* 9.1 9.2 10.5  HGB 6.9* 6.8* 7.9* 8.6*  HCT 22.7* 22.1* 25.1* 28.2*  MCV 88.3 88.4 88.7 90.7  PLT 270 334 372  493*    Cardiac Enzymes: No results for input(s): CKTOTAL, CKMB, CKMBINDEX, TROPONINI in the last 168 hours.  BNP (last 3 results) No results for input(s): BNP in the last 8760 hours.  ProBNP (last 3 results) No results for input(s): PROBNP in the last 8760 hours.  Radiological Exams: Dg Abd Portable 1v  Result Date: 11/06/2018 CLINICAL DATA:  Nasogastric tube placement EXAM: PORTABLE ABDOMEN - 1 VIEW COMPARISON:  November 06, 2018 study obtained earlier in the day FINDINGS: Nasogastric tube tip and side port are in the distal body of the stomach. There is no bowel dilatation or air-fluid level to suggest bowel obstruction. No evident free air. Visualized lung bases clear. IMPRESSION: Nasogastric tube tip and side port now in distal body of stomach region. No bowel obstruction or free air evident. Visualized lung bases clear. Electronically Signed   By: Bretta Bang III M.D.   On: 11/06/2018 15:44   Dg Abd Portable 1v  Result Date: 11/06/2018 CLINICAL DATA:  Nasogastric tube placement. EXAM: PORTABLE ABDOMEN - 1 VIEW COMPARISON:  Radiograph of October 25, 2018. FINDINGS: The bowel gas pattern is normal. Nasogastric tube tip is seen at expected position of gastroesophageal junction, with side hole in distal esophagus. No radio-opaque calculi or other significant radiographic abnormality are seen. IMPRESSION: Nasogastric tube tip seen in expected position of gastroesophageal junction; advancement is recommended. No evidence of  bowel obstruction or ileus. Electronically Signed   By: Lupita RaiderJames  Green Jr M.D.   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 Acute on chronic respiratory failure with hypoxia continue with pressure  control on the ventilator.  Currently requiring 40% FiO2.  Continue to wean as tolerated.  Continue pulmonary toilet secretion management 2. Aspiration pneumonia clinically improving 3. Atrial fibrillation rate controlled 4. Chronic congestive heart failure diuretics as necessary 5. Severe COPD at baseline continue present management   I have personally seen and evaluated the patient, evaluated laboratory and imaging results, formulated the assessment and plan and placed orders. The Patient requires high complexity decision making for assessment and support.  Case was discussed on Rounds with the Respiratory Therapy Staff  Yevonne PaxSaadat A Itza Maniaci, MD [facility] Pulmonary Critical Care Medicine Sleep Medicine

## 2018-11-06 NOTE — Brief Op Note (Signed)
11/06/2018  12:16 PM  PATIENT:  Shannon Lang  73 y.o. female  PRE-OPERATIVE DIAGNOSIS:  respiratory failure  POST-OPERATIVE DIAGNOSIS:  respiratory failure  PROCEDURE:  Procedure(s): TRACHEOSTOMY (N/A) # 6 Shiley Cuffed  SURGEON:  Surgeon(s) and Role:    Drema Halon, MD - Primary  PHYSICIAN ASSISTANT:   ASSISTANTS: none   ANESTHESIA:   general  EBL:  minimal   BLOOD ADMINISTERED:none  DRAINS: none   LOCAL MEDICATIONS USED:  XYLOCAINE   SPECIMEN:  No Specimen  DISPOSITION OF SPECIMEN:  N/A  COUNTS:  YES  TOURNIQUET:  * No tourniquets in log *  DICTATION: .Other Dictation: Dictation Number [id number]  PLAN OF CARE: Discharge to home after PACU  PATIENT DISPOSITION:  PACU - hemodynamically stable.   Delay start of Pharmacological VTE agent (>24hrs) due to surgical blood loss or risk of bleeding: yes

## 2018-11-06 NOTE — H&P (Signed)
PREOPERATIVE H&P  Chief Complaint: Acute on chronic respiratory failure  HPI: Shannon Lang is a 73 y.o. female who presents for evaluation of possible tracheostomy for acute on chronic respiratory failure.  Patient is a chronic nursing home resident who has severe COPD and chronic A. fib as well as type 2 diabetes.  She has been on BiPAP previously but required intubation and placement on ventilator following pneumonia.  [facility]  She was transferred on BiPAP but had to be intubated on 4/16.  Weaning the patient has been unsuccessful.  She apparently had an arrest on 416 requiring CPR.  She has been anemic and has required several transfusions.  She has been intubated for over 2 weeks and tracheostomy was recommended by pulmonary.  She is taken to the operating room at this time for tracheotomy.  Past Medical History:  Diagnosis Date  . Acute on chronic respiratory failure with hypoxia (HCC)   . Aspiration pneumonia due to gastric secretions (HCC)   . Atrial fibrillation with RVR (HCC)   . Chronic congestive heart failure with left ventricular diastolic dysfunction (HCC)   . COPD, severe (HCC)     Social History   Socioeconomic History  . Marital status: Single    Spouse name: Not on file  . Number of children: Not on file  . Years of education: Not on file  . Highest education level: Not on file  Occupational History  . Not on file  Social Needs  . Financial resource strain: Not on file  . Food insecurity:    Worry: Not on file    Inability: Not on file  . Transportation needs:    Medical: Not on file    Non-medical: Not on file  Tobacco Use  . Smoking status: Not on file  Substance and Sexual Activity  . Alcohol use: Not on file  . Drug use: Not on file  . Sexual activity: Not on file  Lifestyle  . Physical activity:    Days per week: Not on file    Minutes per session: Not on file  . Stress: Not on file   Relationships  . Social connections:    Talks on phone: Not on file    Gets together: Not on file    Attends religious service: Not on file    Active member of club or organization: Not on file    Attends meetings of clubs or organizations: Not on file    Relationship status: Not on file  Other Topics Concern  . Not on file  Social History Narrative  . Not on file   No family history on file. Allergies not on file Prior to Admission medications   Not on File     Positive ROS: Negative  All other systems have been reviewed and were otherwise negative with the exception of those mentioned in the HPI and as above.  Physical Exam: There were no vitals filed for this visit.  General: Intubated on vent and sedated.  Janina Mayo was discussed with patient. Nasal: Clear nasal passages Neck: No palpable adenopathy or thyroid nodules Cardiovascular: Irregular rate and rhythm, no murmur.  Respiratory: Clear to auscultation Neurologic: Alert   Assessment/Plan: respiratory failure Plan for Procedure(s): TRACHEOSTOMY   [name], MD 11/06/2018 11:04 AM

## 2018-11-06 NOTE — Anesthesia Preprocedure Evaluation (Addendum)
Anesthesia Evaluation  Patient identified by MRN, date of birth, ID band Patient awake    Reviewed: Allergy & Precautions, NPO status , Patient's Chart, lab work & pertinent test results  Airway Mallampati: Intubated       Dental   Unable to assess:   Pulmonary pneumonia (aspiration peumonia c/b respiratory failure requiring ventilation), COPD,    + rhonchi  + decreased breath sounds      Cardiovascular +CHF  + dysrhythmias Atrial Fibrillation  Rhythm:Regular Rate:Normal     Neuro/Psych negative neurological ROS  negative psych ROS   GI/Hepatic negative GI ROS, Neg liver ROS,   Endo/Other  negative endocrine ROS  Renal/GU negative Renal ROS  negative genitourinary   Musculoskeletal negative musculoskeletal ROS (+)   Abdominal   Peds negative pediatric ROS (+)  Hematology  (+) Blood dyscrasia (Hgb 8.6), anemia ,   Anesthesia Other Findings   Reproductive/Obstetrics negative OB ROS                             Anesthesia Physical Anesthesia Plan  ASA: III  Anesthesia Plan: General   Post-op Pain Management:    Induction: Intravenous  PONV Risk Score and Plan: 3 and Ondansetron, Dexamethasone and Treatment may vary due to age or medical condition  Airway Management Planned: Oral ETT and Tracheostomy  Additional Equipment:   Intra-op Plan:   Post-operative Plan: Post-operative intubation/ventilation  Informed Consent: I have reviewed the patients History and Physical, chart, labs and discussed the procedure including the risks, benefits and alternatives for the proposed anesthesia with the patient or authorized representative who has indicated his/her understanding and acceptance.   Patient has DNR.  Discussed DNR with power of attorney and Suspend DNR.   Dental advisory given  Plan Discussed with: CRNA  Anesthesia Plan Comments:        Anesthesia Quick  Evaluation

## 2018-11-06 NOTE — Anesthesia Postprocedure Evaluation (Signed)
Anesthesia Post Note  Patient: [name]  Procedure(s) Performed: TRACHEOSTOMY (N/A Neck)     Patient location during evaluation: ICU Anesthesia Type: General Level of consciousness: sedated Pain management: pain level controlled Vital Signs Assessment: post-procedure vital signs reviewed and stable Respiratory status: patient on ventilator - see flowsheet for VS Cardiovascular status: blood pressure returned to baseline Postop Assessment: no apparent nausea or vomiting Anesthetic complications: no    Last Vitals: There were no vitals filed for this visit.  Last Pain: There were no vitals filed for this visit.               Neala Miggins L Vishruth Seoane

## 2018-11-06 NOTE — Transfer of Care (Signed)
Immediate Anesthesia Transfer of Care Note  Patient: Shannon Lang  Procedure(s) Performed: TRACHEOSTOMY (N/A Neck)  [phone]  Anesthesia Type:General  Level of Consciousness: sedated and Patient remains intubated per anesthesia plan  Airway & Oxygen Therapy: Patient connected to tracheostomy mask oxygen and Patient placed on Ventilator (see vital sign flow sheet for setting)  Post-op Assessment: Report given to RN and Post -op Vital signs reviewed and stable  Post vital signs: Reviewed and stable  Last Vitals:  Vitals Value Taken Time  BP    Temp    Pulse    Resp    SpO2      Last Pain: There were no vitals filed for this visit.       Complications: No apparent anesthesia complications

## 2018-11-07 ENCOUNTER — Encounter (HOSPITAL_COMMUNITY): Payer: Self-pay | Admitting: Otolaryngology

## 2018-11-07 DIAGNOSIS — I4891 Unspecified atrial fibrillation: Secondary | ICD-10-CM | POA: Diagnosis not present

## 2018-11-07 DIAGNOSIS — I5032 Chronic diastolic (congestive) heart failure: Secondary | ICD-10-CM | POA: Diagnosis not present

## 2018-11-07 DIAGNOSIS — J690 Pneumonitis due to inhalation of food and vomit: Secondary | ICD-10-CM | POA: Diagnosis not present

## 2018-11-07 DIAGNOSIS — J9621 Acute and chronic respiratory failure with hypoxia: Secondary | ICD-10-CM | POA: Diagnosis not present

## 2018-11-07 LAB — C DIFFICILE QUICK SCREEN W PCR REFLEX??: C Diff antigen: NEGATIVE

## 2018-11-07 LAB — C DIFFICILE QUICK SCREEN W PCR REFLEX
C Diff interpretation: NOT DETECTED
C Diff toxin: NEGATIVE

## 2018-11-07 NOTE — Progress Notes (Addendum)
[facility] [place]   PULMONARY CRITICAL CARE SERVICE  PROGRESS NOTE  Date of Service: 11/07/2018  Shannon Lang  ZHY:[id number]  DOB: 04/10/1946   DOA: 10/17/2018  Referring Physician: Carron CurieAli Hijazi, MD  HPI: Shannon Lang is a 73 y.o. female seen for follow up of Acute on Chronic Respiratory Failure.  Patient did 6 hours on pressure support a 16/5 and 35% FiO2.  Pneumothorax on pressure control 100 to 35%.  Doing well at this time.  Medications: Reviewed on Rounds  Physical Exam:  Vitals: Pulse 72 respirations 20 BP 155/72 O2 sat 96% temp 97.8   Ventilator Settings ventilator mode pressure control rate of 18 inspiratory pressure 25 PEEP 5 FiO2 35%  . General: Comfortable at this time . Eyes: Grossly normal lids, irises & conjunctiva . ENT: grossly tongue is normal . Neck: no obvious mass . Cardiovascular: S1 S2 normal no gallop . Respiratory: Coarse breath sounds . Abdomen: soft . Skin: no rash seen on limited exam . Musculoskeletal: not rigid . Psychiatric:unable to assess . Neurologic: no seizure no involuntary movements         Lab Data:   Basic Metabolic Panel: Recent Labs  Lab 11/02/18 0426 11/05/18 0459  NA 138 138  K 4.0 3.9  CL 92* 91*  CO2 34* 36*  GLUCOSE 114* 83  BUN 40* 31*  CREATININE 1.47* 1.51*  CALCIUM 8.2* 8.8*  MG 2.0 2.3  PHOS 4.6 5.2*    ABG: No results for input(s): PHART, PCO2ART, PO2ART, HCO3, O2SAT in the last 168 hours.  Liver Function Tests: No results for input(s): AST, ALT, ALKPHOS, BILITOT, PROT, ALBUMIN in the last 168 hours. No results for input(s): LIPASE, AMYLASE in the last 168 hours. No results for input(s): AMMONIA in the last 168 hours.  CBC: Recent Labs  Lab 11/01/18 1203 11/02/18 0426 11/05/18 1431  WBC 9.1 9.2 10.5  HGB 6.8* 7.9* 8.6*  HCT 22.1* 25.1* 28.2*  MCV 88.4 88.7 90.7  PLT 334 372 493*    Cardiac Enzymes: No results for input(s): CKTOTAL, CKMB,  CKMBINDEX, TROPONINI in the last 168 hours.  BNP (last 3 results) No results for input(s): BNP in the last 8760 hours.  ProBNP (last 3 results) No results for input(s): PROBNP in the last 8760 hours.  Radiological Exams: Dg Abd Portable 1v  Result Date: 11/06/2018 CLINICAL DATA:  Nasogastric tube placement EXAM: PORTABLE ABDOMEN - 1 VIEW COMPARISON:  November 06, 2018 study obtained earlier in the day FINDINGS: Nasogastric tube tip and side port are in the distal body of the stomach. There is no bowel dilatation or air-fluid level to suggest bowel obstruction. No evident free air. Visualized lung bases clear. IMPRESSION: Nasogastric tube tip and side port now in distal body of stomach region. No bowel obstruction or free air evident. Visualized lung bases clear. Electronically Signed   By: Bretta BangWilliam  Woodruff III M.D.   On: 11/06/2018 15:44   Dg Abd Portable 1v  Result Date: 11/06/2018 CLINICAL DATA:  Nasogastric tube placement. EXAM: PORTABLE ABDOMEN - 1 VIEW COMPARISON:  Radiograph of October 25, 2018. FINDINGS: The bowel gas pattern is normal. Nasogastric tube tip is seen at expected position of gastroesophageal junction, with side hole in distal esophagus. No radio-opaque calculi or other significant radiographic abnormality are seen. IMPRESSION: Nasogastric tube tip seen in expected position of gastroesophageal junction; advancement is recommended. No evidence of bowel obstruction or ileus. Electronically Signed   By: Zenda AlpersJames  Green Jr M.D.  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 Acute chronic respiratory failure with hypoxia continue with pressure control on the ventilator.  Patient did 6 hours on pressure support  today will increase per protocol tomorrow.  Currently requiring 35% FiO2. 2. Aspiration pneumonia clinically improving 3. Fibrillation rate controlled 4. Cardiologist heart failure diuretics as necessary 5. Severe COPD at baseline continue present   I have personally seen and evaluated the patient, evaluated laboratory and imaging results, formulated the assessment and plan and placed orders. The Patient requires high complexity decision making for assessment and support.  Case was discussed on Rounds with the Respiratory Therapy Staff  Yevonne Pax, MD [facility] Pulmonary Critical Care Medicine Sleep Medicine

## 2018-11-08 ENCOUNTER — Other Ambulatory Visit (HOSPITAL_COMMUNITY): Payer: Medicare Other

## 2018-11-08 DIAGNOSIS — J9621 Acute and chronic respiratory failure with hypoxia: Secondary | ICD-10-CM | POA: Diagnosis not present

## 2018-11-08 DIAGNOSIS — I5032 Chronic diastolic (congestive) heart failure: Secondary | ICD-10-CM | POA: Diagnosis not present

## 2018-11-08 DIAGNOSIS — J690 Pneumonitis due to inhalation of food and vomit: Secondary | ICD-10-CM | POA: Diagnosis not present

## 2018-11-08 DIAGNOSIS — I4891 Unspecified atrial fibrillation: Secondary | ICD-10-CM | POA: Diagnosis not present

## 2018-11-08 NOTE — Progress Notes (Addendum)
Pulmonary Critical Care Medicine [facility] [place]   PULMONARY CRITICAL CARE SERVICE  PROGRESS NOTE  Date of Service: 11/08/2018  Shannon Lang  QMG:[id number]  DOB: [birth year]   DOA: 10/17/2018  Referring Physician: Carron Curie, MD  HPI: Shannon Lang is a 73 y.o. female seen for follow up of Acute on Chronic Respiratory Failure.  Patient remains on pressure control at this time.  Failed PSVT today 16/5.  Was able to do 16 hours on pressure support yesterday.   Medications: Reviewed on Rounds  Physical Exam:  Vitals:  Pulse 50 respirations 21 BP 115/57 O2 sat 95% temp 97.8  Ventilator Settings ventilator mode AC PC rate of 18 inspiratory pressure 25 PEEP of 5 FiO2 35%  . General: Comfortable at this time . Eyes: Grossly normal lids, irises & conjunctiva . ENT: grossly tongue is normal . Neck: no obvious mass . Cardiovascular: S1 S2 normal no gallop . Respiratory: Coarse breath sounds . Abdomen: soft . Skin: no rash seen on limited exam . Musculoskeletal: not rigid . Psychiatric:unable to assess . Neurologic: no seizure no involuntary movements         Lab Data:   Basic Metabolic Panel: Recent Labs  Lab 11/02/18 0426 11/05/18 0459  NA 138 138  K 4.0 3.9  CL 92* 91*  CO2 34* 36*  GLUCOSE 114* 83  BUN 40* 31*  CREATININE 1.47* 1.51*  CALCIUM 8.2* 8.8*  MG 2.0 2.3  PHOS 4.6 5.2*    ABG: No results for input(s): PHART, PCO2ART, PO2ART, HCO3, O2SAT in the last 168 hours.  Liver Function Tests: No results for input(s): AST, ALT, ALKPHOS, BILITOT, PROT, ALBUMIN in the last 168 hours. No results for input(s): LIPASE, AMYLASE in the last 168 hours. No results for input(s): AMMONIA in the last 168 hours.  CBC: Recent Labs  Lab 11/02/18 0426 11/05/18 1431  WBC 9.2 10.5  HGB 7.9* 8.6*  HCT 25.1* 28.2*  MCV 88.7 90.7  PLT 372 493*    Cardiac Enzymes: No results for input(s): CKTOTAL, CKMB, CKMBINDEX, TROPONINI in the last 168 hours.  BNP  (last 3 results) No results for input(s): BNP in the last 8760 hours.  ProBNP (last 3 results) No results for input(s): PROBNP in the last 8760 hours.  Radiological Exams: Dg Abd Portable 1v  Result Date: 11/06/2018 CLINICAL DATA:  Nasogastric tube placement EXAM: PORTABLE ABDOMEN - 1 VIEW COMPARISON:  November 06, 2018 study obtained earlier in the day FINDINGS: Nasogastric tube tip and side port are in the distal body of the stomach. There is no bowel dilatation or air-fluid level to suggest bowel obstruction. No evident free air. Visualized lung bases clear. IMPRESSION: Nasogastric tube tip and side port now in distal body of stomach region. No bowel obstruction or free air evident. Visualized lung bases clear. Electronically Signed   By: Bretta Bang III M.D.   On: 11/06/2018 15:44   Dg Abd Portable 1v  Result Date: 11/06/2018 CLINICAL DATA:  Nasogastric tube placement. EXAM: PORTABLE ABDOMEN - 1 VIEW COMPARISON:  Radiograph of October 25, 2018. FINDINGS: The bowel gas pattern is normal. Nasogastric tube tip is seen at expected position of gastroesophageal junction, with side hole in distal esophagus. No radio-opaque calculi or other significant radiographic abnormality are seen. IMPRESSION: Nasogastric tube tip seen in expected position of gastroesophageal junction; advancement is recommended. No evidence of bowel obstruction or ileus. Electronically Signed   By: Lupita Raider M.D.   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 Acute on chronic respiratory failure with hypoxia continue with pressure control on the ventilator.  Continue to attempt pressure support trials.  Continue pulmonary toilet 2. Aspiration  pneumonia clinically improving 3. Atrial fibrillation rate controlled 4. Chronic congestive heart failure diuretics as necessary 5. Severe COPD at baseline continue present management   I have personally seen and evaluated the patient, evaluated laboratory and imaging results, formulated the assessment and plan and placed orders. The Patient requires high complexity decision making for assessment and support.  Case was discussed on Rounds with the Respiratory Therapy Staff  Yevonne PaxSaadat A Zailynn Brandel, MD [facility] Pulmonary Critical Care Medicine Sleep Medicine

## 2018-11-09 ENCOUNTER — Other Ambulatory Visit (HOSPITAL_COMMUNITY): Payer: Medicare Other

## 2018-11-09 DIAGNOSIS — I4891 Unspecified atrial fibrillation: Secondary | ICD-10-CM | POA: Diagnosis not present

## 2018-11-09 DIAGNOSIS — J690 Pneumonitis due to inhalation of food and vomit: Secondary | ICD-10-CM | POA: Diagnosis not present

## 2018-11-09 DIAGNOSIS — I5032 Chronic diastolic (congestive) heart failure: Secondary | ICD-10-CM | POA: Diagnosis not present

## 2018-11-09 DIAGNOSIS — J9621 Acute and chronic respiratory failure with hypoxia: Secondary | ICD-10-CM | POA: Diagnosis not present

## 2018-11-09 LAB — BASIC METABOLIC PANEL
Anion gap: 11 (ref 5–15)
BUN: 43 mg/dL — ABNORMAL HIGH (ref 8–23)
CO2: 32 mmol/L (ref 22–32)
Calcium: 8.7 mg/dL — ABNORMAL LOW (ref 8.9–10.3)
Chloride: 95 mmol/L — ABNORMAL LOW (ref 98–111)
Creatinine, Ser: 1.42 mg/dL — ABNORMAL HIGH (ref 0.44–1.00)
GFR calc Af Amer: 43 mL/min — ABNORMAL LOW (ref >60)
GFR calc non Af Amer: 37 mL/min — ABNORMAL LOW (ref >60)
Glucose, Bld: 81 mg/dL (ref 70–99)
Potassium: 3.2 mmol/L — ABNORMAL LOW (ref 3.5–5.1)
Sodium: 138 mmol/L (ref 135–145)

## 2018-11-09 LAB — CBC
HCT: 28.7 % — ABNORMAL LOW (ref 36.0–46.0)
Hemoglobin: 8.8 g/dL — ABNORMAL LOW (ref 12.0–15.0)
MCH: 27.6 pg (ref 26.0–34.0)
MCHC: 30.7 g/dL (ref 30.0–36.0)
MCV: 90 fL (ref 80.0–100.0)
Platelets: 515 10*3/uL — ABNORMAL HIGH (ref 150–400)
RBC: 3.19 MIL/uL — ABNORMAL LOW (ref 3.87–5.11)
RDW: 18.1 % — ABNORMAL HIGH (ref 11.5–15.5)
WBC: 9.1 10*3/uL (ref 4.0–10.5)
nRBC: 0 % (ref 0.0–0.2)

## 2018-11-09 NOTE — Progress Notes (Addendum)
[facility] [place]   PULMONARY CRITICAL CARE SERVICE  PROGRESS NOTE  Date of Service: 11/09/2018  Shannon Lang  [id number]  DOB: 03/18/1946   DOA: 10/17/2018  Referring Physician: Carron CurieAli Hijazi, MD  HPI: Shannon NianSheila Thorson is a 73 y.o. female seen for follow up of Acute on Chronic Respiratory Failure.  Patient once again failed pressure support today remains on pressure control mode with a rate of 18 FiO2 35%.  Medications: Reviewed on Rounds  Physical Exam:  Vitals: Pulse 56 respiration 25 BP 132/57 O2 sat 98% temp 98.1  Ventilator Settings ventilator mode AC PC rate of 18 inspiratory pressure 25 PEEP of 5 FiO2 35%  . General: Comfortable at this time . Eyes: Grossly normal lids, irises & conjunctiva . ENT: grossly tongue is normal . Neck: no obvious mass . Cardiovascular: S1 S2 normal no gallop . Respiratory: Coarse breath sounds . Abdomen: soft . Skin: no rash seen on limited exam . Musculoskeletal: not rigid . Psychiatric:unable to assess . Neurologic: no seizure no involuntary movements         Lab Data:   Basic Metabolic Panel: Recent Labs  Lab 11/05/18 0459 11/09/18 0600  NA 138 138  K 3.9 3.2*  CL 91* 95*  CO2 36* 32  GLUCOSE 83 81  BUN 31* 43*  CREATININE 1.51* 1.42*  CALCIUM 8.8* 8.7*  MG 2.3  --   PHOS 5.2*  --     ABG: No results for input(s): PHART, PCO2ART, PO2ART, HCO3, O2SAT in the last 168 hours.  Liver Function Tests: No results for input(s): AST, ALT, ALKPHOS, BILITOT, PROT, ALBUMIN in the last 168 hours. No results for input(s): LIPASE, AMYLASE in the last 168 hours. No results for input(s): AMMONIA in the last 168 hours.  CBC: Recent Labs  Lab 11/05/18 1431 11/09/18 0600  WBC 10.5 9.1  HGB 8.6* 8.8*  HCT 28.2* 28.7*  MCV 90.7 90.0  PLT 493* 515*    Cardiac Enzymes: No results for input(s): CKTOTAL, CKMB, CKMBINDEX, TROPONINI in the last 168 hours.  BNP (last 3 results) No  results for input(s): BNP in the last 8760 hours.  ProBNP (last 3 results) No results for input(s): PROBNP in the last 8760 hours.  Radiological Exams: Dg Chest Port 1 View  Result Date: 11/09/2018 CLINICAL DATA:  Respiratory failure. EXAM: PORTABLE CHEST 1 VIEW COMPARISON:  Chest x-ray dated October 28, 2018. FINDINGS: Interval placement of a tracheostomy tube with the tip at the thoracic inlet. Unchanged enteric tube with the tip below the field of view. Interval removal of the left upper extremity PICC line. Stable cardiomediastinal silhouette. Normal pulmonary vascularity. Improved bilateral streaky opacities with some residual right perihilar and basilar opacity. Unchanged small right pleural effusion. No pneumothorax. No acute osseous abnormality. IMPRESSION: 1. Improved aeration bilaterally with residual right perihilar and basilar atelectasis. 2. Unchanged small right pleural effusion. Electronically Signed   By: Obie DredgeWilliam T Derry M.D.   On: 11/09/2018 07:24    Assessment/Plan Active Problems:   Acute on chronic respiratory failure with hypoxia (HCC)   Aspiration pneumonia due to gastric secretions (HCC)   Atrial fibrillation with RVR (HCC)   Chronic congestive heart failure with left ventricular diastolic dysfunction (HCC)   COPD, severe (HCC)   1. Acute on chronic respiratory failure with hypoxia continue to control ventilator continue to attempt pressure support trials.  Continue pulmonary toilet secretion management 2. Aspiration pneumonia "improving 3. Atrial fibrillation rate controlled 4. Chronic congestive heart failure  directs as necessary 5. Severe COPD at baseline continue present management   I have personally seen and evaluated the patient, evaluated laboratory and imaging results, formulated the assessment and plan and placed orders. The Patient requires high complexity decision making for assessment and support.  Case was discussed on Rounds with the Respiratory Therapy  Staff  Yevonne Pax, MD [facility] Pulmonary Critical Care Medicine Sleep Medicine

## 2018-11-10 DIAGNOSIS — I4891 Unspecified atrial fibrillation: Secondary | ICD-10-CM | POA: Diagnosis not present

## 2018-11-10 DIAGNOSIS — J9621 Acute and chronic respiratory failure with hypoxia: Secondary | ICD-10-CM | POA: Diagnosis not present

## 2018-11-10 DIAGNOSIS — I5032 Chronic diastolic (congestive) heart failure: Secondary | ICD-10-CM | POA: Diagnosis not present

## 2018-11-10 DIAGNOSIS — J690 Pneumonitis due to inhalation of food and vomit: Secondary | ICD-10-CM | POA: Diagnosis not present

## 2018-11-10 LAB — POTASSIUM: Potassium: 3.3 mmol/L — ABNORMAL LOW (ref 3.5–5.1)

## 2018-11-10 LAB — OCCULT BLOOD X 1 CARD TO LAB, STOOL: Fecal Occult Bld: NEGATIVE

## 2018-11-10 NOTE — Progress Notes (Addendum)
[facility]   PULMONARY CRITICAL CARE SERVICE  PROGRESS NOTE  Date of Service: 11/10/2018  Shannon Lang  [id number]  DOB: 04/08/1946   DOA: 10/17/2018  Referring Physician: Carron CurieAli Hijazi, MD  HPI: Shannon Lang is a 73 y.o. female seen for follow up of Acute on Chronic Respiratory Failure.  Patient continues on full support on ventilator.  Unable to wean currently.  Satting in the upper 90s at this time no distress.  Medications: Reviewed on Rounds  Physical Exam:  Vitals: Pulse 60 respiration 30 BP 122/1 4 9  O2 sat 90% temp 98.4  Ventilator Settings later mode AC PC rate of 18 and support pressure 25 PEEP of 5 FiO2 35%  . General: Comfortable at this time . Eyes: Grossly normal lids, irises & conjunctiva . ENT: grossly tongue is normal . Neck: no obvious mass . Cardiovascular: S1 S2 normal no gallop . Respiratory: Coarse breath sounds . Abdomen: soft . Skin: no rash seen on limited exam . Musculoskeletal: not rigid . Psychiatric:unable to assess . Neurologic: no seizure no involuntary movements         Lab Data:   Basic Metabolic Panel: Recent Labs  Lab 11/05/18 0459 11/09/18 0600 11/10/18 0919  NA 138 138  --   K 3.9 3.2* 3.3*  CL 91* 95*  --   CO2 36* 32  --   GLUCOSE 83 81  --   BUN 31* 43*  --   CREATININE 1.51* 1.42*  --   CALCIUM 8.8* 8.7*  --   MG 2.3  --   --   PHOS 5.2*  --   --     ABG: No results for input(s): PHART, PCO2ART, PO2ART, HCO3, O2SAT in the last 168 hours.  Liver Function Tests: No results for input(s): AST, ALT, ALKPHOS, BILITOT, PROT, ALBUMIN in the last 168 hours. No results for input(s): LIPASE, AMYLASE in the last 168 hours. No results for input(s): AMMONIA in the last 168 hours.  CBC: Recent Labs  Lab 11/05/18 1431 11/09/18 0600  WBC 10.5 9.1  HGB 8.6* 8.8*  HCT 28.2* 28.7*  MCV 90.7 90.0  PLT 493* 515*    Cardiac Enzymes: No results for input(s): CKTOTAL,  CKMB, CKMBINDEX, TROPONINI in the last 168 hours.  BNP (last 3 results) No results for input(s): BNP in the last 8760 hours.  ProBNP (last 3 results) No results for input(s): PROBNP in the last 8760 hours.  Radiological Exams: Dg Chest Port 1 View  Result Date: 11/09/2018 CLINICAL DATA:  Respiratory failure. EXAM: PORTABLE CHEST 1 VIEW COMPARISON:  Chest x-ray dated October 28, 2018. FINDINGS: Interval placement of a tracheostomy tube with the tip at the thoracic inlet. Unchanged enteric tube with the tip below the field of view. Interval removal of the left upper extremity PICC line. Stable cardiomediastinal silhouette. Normal pulmonary vascularity. Improved bilateral streaky opacities with some residual right perihilar and basilar opacity. Unchanged small right pleural effusion. No pneumothorax. No acute osseous abnormality. IMPRESSION: 1. Improved aeration bilaterally with residual right perihilar and basilar atelectasis. 2. Unchanged small right pleural effusion. Electronically Signed   By: Obie DredgeWilliam T Derry M.D.   On: 11/09/2018 07:24    Assessment/Plan Active Problems:   Acute on chronic respiratory failure with hypoxia (HCC)   Aspiration pneumonia due to gastric secretions (HCC)   Atrial fibrillation with RVR (HCC)   Chronic congestive heart failure with left ventricular diastolic dysfunction (HCC)   COPD, severe (HCC)   1.  Acute on chronic respiratory failure with hypoxia continue pressure control on the ventilator.  Continue supportive measures and pulmonary toilet 2. Aspiration pneumonia improving continue supportive care 3. Atrial fibrillation rate controlled 4. Chronic congestive heart failure continue diuretics as necessary 5. Severe COPD at baseline continue present management   I have personally seen and evaluated the patient, evaluated laboratory and imaging results, formulated the assessment and plan and placed orders. The Patient requires high complexity decision making  for assessment and support.  Case was discussed on Rounds with the Respiratory Therapy Staff  Yevonne Pax, MD [facility] Pulmonary Critical Care Medicine Sleep Medicine

## 2018-11-11 DIAGNOSIS — I4891 Unspecified atrial fibrillation: Secondary | ICD-10-CM | POA: Diagnosis not present

## 2018-11-11 DIAGNOSIS — I5032 Chronic diastolic (congestive) heart failure: Secondary | ICD-10-CM | POA: Diagnosis not present

## 2018-11-11 DIAGNOSIS — J690 Pneumonitis due to inhalation of food and vomit: Secondary | ICD-10-CM | POA: Diagnosis not present

## 2018-11-11 DIAGNOSIS — J9621 Acute and chronic respiratory failure with hypoxia: Secondary | ICD-10-CM | POA: Diagnosis not present

## 2018-11-11 LAB — POTASSIUM: Potassium: 3.6 mmol/L (ref 3.5–5.1)

## 2018-11-11 LAB — TRIGLYCERIDES: Triglycerides: 168 mg/dL — ABNORMAL HIGH (ref <150)

## 2018-11-11 NOTE — Progress Notes (Addendum)
Pulmonary Critical Care Medicine [facility] [place]   PULMONARY CRITICAL CARE SERVICE  PROGRESS NOTE  Date of Service: 11/11/2018  Shannon Lang  [id number]  DOB: 11-Aug-1945   DOA: 10/17/2018  Referring Physician: Carron Curie, MD  HPI: Shannon Lang is a 73 y.o. female seen for follow up of Acute on Chronic Respiratory Failure.  Patient remains on full support at this time.  Failed pressure support today due to tachypnea and volumes that were pulling less than 200.  Satting well on ventilator.  Medications: Reviewed on Rounds  Physical Exam:  Vitals: Pulse 56 respirations 20 BP 30/58 O2 sat 100% 7.1  Ventilator Settings ventilator mode AC VC rate of 20 inspiratory pressure 25 PEEP of 5 FiO2 30%  . General: Comfortable at this time . Eyes: Grossly normal lids, irises & conjunctiva . ENT: grossly tongue is normal . Neck: no obvious mass . Cardiovascular: S1 S2 normal no gallop . Respiratory: Coarse breath sounds . Abdomen: soft . Skin: no rash seen on limited exam . Musculoskeletal: not rigid . Psychiatric:unable to assess . Neurologic: no seizure no involuntary movements         Lab Data:   Basic Metabolic Panel: Recent Labs  Lab 11/05/18 0459 11/09/18 0600 11/10/18 0919  NA 138 138  --   K 3.9 3.2* 3.3*  CL 91* 95*  --   CO2 36* 32  --   GLUCOSE 83 81  --   BUN 31* 43*  --   CREATININE 1.51* 1.42*  --   CALCIUM 8.8* 8.7*  --   MG 2.3  --   --   PHOS 5.2*  --   --     ABG: No results for input(s): PHART, PCO2ART, PO2ART, HCO3, O2SAT in the last 168 hours.  Liver Function Tests: No results for input(s): AST, ALT, ALKPHOS, BILITOT, PROT, ALBUMIN in the last 168 hours. No results for input(s): LIPASE, AMYLASE in the last 168 hours. No results for input(s): AMMONIA in the last 168 hours.  CBC: Recent Labs  Lab 11/05/18 1431 11/09/18 0600  WBC 10.5 9.1  HGB 8.6* 8.8*  HCT 28.2* 28.7*  MCV 90.7 90.0  PLT 493* 515*    Cardiac  Enzymes: No results for input(s): CKTOTAL, CKMB, CKMBINDEX, TROPONINI in the last 168 hours.  BNP (last 3 results) No results for input(s): BNP in the last 8760 hours.  ProBNP (last 3 results) No results for input(s): PROBNP in the last 8760 hours.  Radiological Exams: No results found.  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 Acute on chronic respiratory failure with hypoxia continue pressure control on ventilator.  Continue supportive measures and pulmonary toilet.  Continue to attempt weaning as tolerated. 2. Aspiration pneumonia remain continue supportive care 3. Atrial fibrillation rate controlled 4. Chronic congestive heart failure continue diuretics as necessary 9 severe COPD at baseline continue present   I have personally seen and evaluated the patient, evaluated laboratory and imaging results, formulated the assessment and plan and placed orders. The Patient requires high complexity decision making for assessment and support.  Case was discussed on Rounds with the Respiratory Therapy Staff  Yevonne Pax, MD [facility] Pulmonary Critical Care Medicine Sleep Medicine

## 2018-11-12 DIAGNOSIS — I4891 Unspecified atrial fibrillation: Secondary | ICD-10-CM | POA: Diagnosis not present

## 2018-11-12 DIAGNOSIS — J690 Pneumonitis due to inhalation of food and vomit: Secondary | ICD-10-CM | POA: Diagnosis not present

## 2018-11-12 DIAGNOSIS — I5032 Chronic diastolic (congestive) heart failure: Secondary | ICD-10-CM | POA: Diagnosis not present

## 2018-11-12 DIAGNOSIS — J9621 Acute and chronic respiratory failure with hypoxia: Secondary | ICD-10-CM | POA: Diagnosis not present

## 2018-11-12 NOTE — Progress Notes (Addendum)
Pulmonary Critical Care Medicine [facility] [place]   PULMONARY CRITICAL CARE SERVICE  PROGRESS NOTE  Date of Service: 11/12/2018  Shannon Lang  [id number]  DOB: 03/10/1946   DOA: 10/17/2018  Referring Physician: Carron Curie, MD  HPI: Shannon Lang is a 73 y.o. female seen for follow up of Acute on Chronic Respiratory Failure.  Patient remains on full support at this time.  Currently requiring 35% FiO2 with no distress noted at this time.  Medications: Reviewed on Rounds  Physical Exam:  Vitals: Pulse 50% 20 BP 105/50 O2 sat 98% temp 98.5  Ventilator Settings ventilator mode AC PC rate of 18 expiratory pressure 12 PEEP 5 FiO2 35%  . General: Comfortable at this time . Eyes: Grossly normal lids, irises & conjunctiva . ENT: grossly tongue is normal . Neck: no obvious mass . Cardiovascular: S1 S2 normal no gallop . Respiratory: Coarse breath sounds . Abdomen: soft . Skin: no rash seen on limited exam . Musculoskeletal: not rigid . Psychiatric:unable to assess . Neurologic: no seizure no involuntary movements         Lab Data:   Basic Metabolic Panel: Recent Labs  Lab 11/09/18 0600 11/10/18 0919 11/11/18 1227  NA 138  --   --   K 3.2* 3.3* 3.6  CL 95*  --   --   CO2 32  --   --   GLUCOSE 81  --   --   BUN 43*  --   --   CREATININE 1.42*  --   --   CALCIUM 8.7*  --   --     ABG: No results for input(s): PHART, PCO2ART, PO2ART, HCO3, O2SAT in the last 168 hours.  Liver Function Tests: No results for input(s): AST, ALT, ALKPHOS, BILITOT, PROT, ALBUMIN in the last 168 hours. No results for input(s): LIPASE, AMYLASE in the last 168 hours. No results for input(s): AMMONIA in the last 168 hours.  CBC: Recent Labs  Lab 11/09/18 0600  WBC 9.1  HGB 8.8*  HCT 28.7*  MCV 90.0  PLT 515*    Cardiac Enzymes: No results for input(s): CKTOTAL, CKMB, CKMBINDEX, TROPONINI in the last 168 hours.  BNP (last 3 results) No results for input(s): BNP in  the last 8760 hours.  ProBNP (last 3 results) No results for input(s): PROBNP in the last 8760 hours.  Radiological Exams: No results found.  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 Acute on chronic respiratory failure with hypoxia continue pressure control on the ventilator.  Continue supportive measures and pulmonary toilet. 2. Aspiration pneumonia continue supportive care 3. Atrial fibrillation rate controlled 4. Chronic congestive heart failure continue diuretics 5. Severe COPD at baseline continue present therapy   I have personally seen and evaluated the patient, evaluated laboratory and imaging results, formulated the assessment and plan and placed orders. The Patient requires high complexity decision making for assessment and support.  Case was discussed on Rounds with the Respiratory Therapy Staff  Yevonne Pax, MD [facility] Pulmonary Critical Care Medicine Sleep Medicine

## 2018-11-13 DIAGNOSIS — I5032 Chronic diastolic (congestive) heart failure: Secondary | ICD-10-CM | POA: Diagnosis not present

## 2018-11-13 DIAGNOSIS — I4891 Unspecified atrial fibrillation: Secondary | ICD-10-CM | POA: Diagnosis not present

## 2018-11-13 DIAGNOSIS — J690 Pneumonitis due to inhalation of food and vomit: Secondary | ICD-10-CM | POA: Diagnosis not present

## 2018-11-13 DIAGNOSIS — J9621 Acute and chronic respiratory failure with hypoxia: Secondary | ICD-10-CM | POA: Diagnosis not present

## 2018-11-13 NOTE — Progress Notes (Addendum)
Pulmonary Critical Care Medicine [facility] [place]   PULMONARY CRITICAL CARE SERVICE  PROGRESS NOTE  Date of Service: 11/13/2018  Shannon Lang  [id number]  DOB: 09-30-1945   DOA: 10/17/2018  Referring Physician: Carron Curie, MD  HPI: Shannon Lang is a 73 y.o. female seen for follow up of Acute on Chronic Respiratory Failure.  Patient failed to wean to pressure support today and remains on pressure control mode on the ventilator.  Resting comfortably with no distress.  Medications: Reviewed on Rounds  Physical Exam:  Vitals: Pulse 61 respirations 25 BP 105/64 O2 sat 98% temp 98.1  Ventilator Settings ventilator mode AC PC rate of 18 inspiratory pressure 23 PEEP of 5 FiO2 35%  . General: Comfortable at this time . Eyes: Grossly normal lids, irises & conjunctiva . ENT: grossly tongue is normal . Neck: no obvious mass . Cardiovascular: S1 S2 normal no gallop . Respiratory: Coarse breath sounds . Abdomen: soft . Skin: no rash seen on limited exam . Musculoskeletal: not rigid . Psychiatric:unable to assess . Neurologic: no seizure no involuntary movements         Lab Data:   Basic Metabolic Panel: Recent Labs  Lab 11/09/18 0600 11/10/18 0919 11/11/18 1227  NA 138  --   --   K 3.2* 3.3* 3.6  CL 95*  --   --   CO2 32  --   --   GLUCOSE 81  --   --   BUN 43*  --   --   CREATININE 1.42*  --   --   CALCIUM 8.7*  --   --     ABG: No results for input(s): PHART, PCO2ART, PO2ART, HCO3, O2SAT in the last 168 hours.  Liver Function Tests: No results for input(s): AST, ALT, ALKPHOS, BILITOT, PROT, ALBUMIN in the last 168 hours. No results for input(s): LIPASE, AMYLASE in the last 168 hours. No results for input(s): AMMONIA in the last 168 hours.  CBC: Recent Labs  Lab 11/09/18 0600  WBC 9.1  HGB 8.8*  HCT 28.7*  MCV 90.0  PLT 515*    Cardiac Enzymes: No results for input(s): CKTOTAL, CKMB, CKMBINDEX, TROPONINI in the last 168 hours.  BNP  (last 3 results) No results for input(s): BNP in the last 8760 hours.  ProBNP (last 3 results) No results for input(s): PROBNP in the last 8760 hours.  Radiological Exams: No results found.  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 Acute on chronic respiratory failure with hypoxia continue pressure control on the ventilator.  Continue supportive measures and pulmonary toilet 2. Aspiration pneumonia continue supportive care 3. Atrial fibrillation rate controlled 4. Chronic congestive heart failure continue diuretics 5. Severe COPD at baseline continue present therapy   I have personally seen and evaluated the patient, evaluated laboratory and imaging results, formulated the assessment and plan and placed orders. The Patient requires high complexity decision making for assessment and support.  Case was discussed on Rounds with the Respiratory Therapy Staff  Yevonne Pax, MD [facility] Pulmonary Critical Care Medicine Sleep Medicine

## 2018-11-14 DIAGNOSIS — I5032 Chronic diastolic (congestive) heart failure: Secondary | ICD-10-CM | POA: Diagnosis not present

## 2018-11-14 DIAGNOSIS — J9621 Acute and chronic respiratory failure with hypoxia: Secondary | ICD-10-CM | POA: Diagnosis not present

## 2018-11-14 DIAGNOSIS — J690 Pneumonitis due to inhalation of food and vomit: Secondary | ICD-10-CM | POA: Diagnosis not present

## 2018-11-14 DIAGNOSIS — I4891 Unspecified atrial fibrillation: Secondary | ICD-10-CM | POA: Diagnosis not present

## 2018-11-14 NOTE — Progress Notes (Addendum)
Pulmonary Critical Care Medicine [facility] [place]   PULMONARY CRITICAL CARE SERVICE  PROGRESS NOTE  Date of Service: 11/14/2018  Shannon Lang  [id number]  DOB: 1946-05-07   DOA: 10/17/2018  Referring Physician: Carron Curie, MD  HPI: Shannon Lang is a 73 y.o. female seen for follow up of Acute on Chronic Respiratory Failure.  Patient did 3 hours today on pressure support 16/5 with an FiO2 of 30%.  Now back on the ventilator assist control mode rate of 18 and an FiO2 of 30%.  Medications: Reviewed on Rounds  Physical Exam:  Vitals: Pulse 59 respirations 22 BP 107/50 O2 sat 95% temp 97.6  Ventilator Settings AC PC rate of 18 respiratory pressure 23 PEEP of 5 FiO2 30%  . General: Comfortable at this time . Eyes: Grossly normal lids, irises & conjunctiva . ENT: grossly tongue is normal . Neck: no obvious mass . Cardiovascular: S1 S2 normal no gallop . Respiratory: Coarse breath sounds . Abdomen: soft . Skin: no rash seen on limited exam . Musculoskeletal: not rigid . Psychiatric:unable to assess . Neurologic: no seizure no involuntary movements         Lab Data:   Basic Metabolic Panel: Recent Labs  Lab 11/09/18 0600 11/10/18 0919 11/11/18 1227  NA 138  --   --   K 3.2* 3.3* 3.6  CL 95*  --   --   CO2 32  --   --   GLUCOSE 81  --   --   BUN 43*  --   --   CREATININE 1.42*  --   --   CALCIUM 8.7*  --   --     ABG: No results for input(s): PHART, PCO2ART, PO2ART, HCO3, O2SAT in the last 168 hours.  Liver Function Tests: No results for input(s): AST, ALT, ALKPHOS, BILITOT, PROT, ALBUMIN in the last 168 hours. No results for input(s): LIPASE, AMYLASE in the last 168 hours. No results for input(s): AMMONIA in the last 168 hours.  CBC: Recent Labs  Lab 11/09/18 0600  WBC 9.1  HGB 8.8*  HCT 28.7*  MCV 90.0  PLT 515*    Cardiac Enzymes: No results for input(s): CKTOTAL, CKMB, CKMBINDEX, TROPONINI in the last 168 hours.  BNP (last 3  results) No results for input(s): BNP in the last 8760 hours.  ProBNP (last 3 results) No results for input(s): PROBNP in the last 8760 hours.  Radiological Exams: No results found.  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 Acute on chronic respiratory failure with hypoxia continue pressure control on the ventilator.  Continue to wean to pressure support as per protocol.  Continue supportive measures and pulmonary toilet 2. Aspiration pneumonia continue supportive care 3. Atrial fibrillation rate controlled 4. Chronic congestive heart failure continue diuretics as tolerated 5. Severe COPD at baseline continue present therapy   I have personally seen and evaluated the patient, evaluated laboratory and imaging results, formulated the assessment and plan and placed orders. The Patient requires high complexity decision making for assessment and support.  Case was discussed on Rounds with the Respiratory Therapy Staff  Yevonne Pax, MD [facility] Pulmonary Critical Care Medicine Sleep Medicine

## 2018-11-15 DIAGNOSIS — J690 Pneumonitis due to inhalation of food and vomit: Secondary | ICD-10-CM | POA: Diagnosis not present

## 2018-11-15 DIAGNOSIS — I5032 Chronic diastolic (congestive) heart failure: Secondary | ICD-10-CM | POA: Diagnosis not present

## 2018-11-15 DIAGNOSIS — I4891 Unspecified atrial fibrillation: Secondary | ICD-10-CM | POA: Diagnosis not present

## 2018-11-15 DIAGNOSIS — J9621 Acute and chronic respiratory failure with hypoxia: Secondary | ICD-10-CM | POA: Diagnosis not present

## 2018-11-15 NOTE — Progress Notes (Addendum)
Pulmonary Critical Care Medicine [facility] [place]   PULMONARY CRITICAL CARE SERVICE  PROGRESS NOTE  Date of Service: 11/15/2018  Shannon Lang  [id number]:[id number]  DOB: 1945/10/11   DOA: 10/17/2018  Referring Physician: Carron Curie, MD  HPI: Shannon Lang is a 73 y.o. female seen for follow up of Acute on Chronic Respiratory Failure.  Patient was on pressure support 16/5 for approximately 6 hours this morning and was switched to 12/5 for the last 3 hours.  Doing well at this time with no distress.  Medications: Reviewed on Rounds  Physical Exam:  Vitals: Pulse 75 respirations 29 BP 139/50 O2 sat 96% temp 98.1  Ventilator Settings currently on pressure support 12/5 FiO2 30%, will go back on ventilator mode AC PC rate 18 respiratory pressure of 23 PEEP of 5 FiO2 30% when complete.  . General: Comfortable at this time . Eyes: Grossly normal lids, irises & conjunctiva . ENT: grossly tongue is normal . Neck: no obvious mass . Cardiovascular: S1 S2 normal no gallop . Respiratory: Coarse breath sounds . Abdomen: soft . Skin: no rash seen on limited exam . Musculoskeletal: not rigid . Psychiatric:unable to assess . Neurologic: no seizure no involuntary movements         Lab Data:   Basic Metabolic Panel: Recent Labs  Lab 11/09/18 0600 11/10/18 0919 11/11/18 1227  NA 138  --   --   K 3.2* 3.3* 3.6  CL 95*  --   --   CO2 32  --   --   GLUCOSE 81  --   --   BUN 43*  --   --   CREATININE 1.42*  --   --   CALCIUM 8.7*  --   --     ABG: No results for input(s): PHART, PCO2ART, PO2ART, HCO3, O2SAT in the last 168 hours.  Liver Function Tests: No results for input(s): AST, ALT, ALKPHOS, BILITOT, PROT, ALBUMIN in the last 168 hours. No results for input(s): LIPASE, AMYLASE in the last 168 hours. No results for input(s): AMMONIA in the last 168 hours.  CBC: Recent Labs  Lab 11/09/18 0600  WBC 9.1  HGB 8.8*  HCT 28.7*  MCV 90.0  PLT 515*    Cardiac  Enzymes: No results for input(s): CKTOTAL, CKMB, CKMBINDEX, TROPONINI in the last 168 hours.  BNP (last 3 results) No results for input(s): BNP in the last 8760 hours.  ProBNP (last 3 results) No results for input(s): PROBNP in the last 8760 hours.  Radiological Exams: No results found.  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 Acute on chronic respiratory failure with hypoxia continue pressure control on ventilator.  Continue to wean as tolerated.  Continue pulmonary toilet and supportive measures 2. Aspiration pneumonia continue supportive care 3. Atrial fibrillation rate controlled 4. Chronic congestive heart failure continue diuretics as necessary 5. Severe COPD at baseline continue present therapy   I have personally seen and evaluated the patient, evaluated laboratory and imaging results, formulated the assessment and plan and placed orders. The Patient requires high complexity decision making for assessment and support.  Case was discussed on Rounds with the Respiratory Therapy Staff  Yevonne Pax, MD [facility] Pulmonary Critical Care Medicine Sleep Medicine

## 2018-11-16 DIAGNOSIS — J9621 Acute and chronic respiratory failure with hypoxia: Secondary | ICD-10-CM | POA: Diagnosis not present

## 2018-11-16 DIAGNOSIS — J690 Pneumonitis due to inhalation of food and vomit: Secondary | ICD-10-CM | POA: Diagnosis not present

## 2018-11-16 DIAGNOSIS — I5032 Chronic diastolic (congestive) heart failure: Secondary | ICD-10-CM | POA: Diagnosis not present

## 2018-11-16 DIAGNOSIS — I4891 Unspecified atrial fibrillation: Secondary | ICD-10-CM | POA: Diagnosis not present

## 2018-11-16 LAB — COMPREHENSIVE METABOLIC PANEL
ALT: 17 U/L (ref 0–44)
AST: 29 U/L (ref 15–41)
Albumin: 2.3 g/dL — ABNORMAL LOW (ref 3.5–5.0)
Alkaline Phosphatase: 150 U/L — ABNORMAL HIGH (ref 38–126)
Anion gap: 15 (ref 5–15)
BUN: 47 mg/dL — ABNORMAL HIGH (ref 8–23)
CO2: 33 mmol/L — ABNORMAL HIGH (ref 22–32)
Calcium: 9.1 mg/dL (ref 8.9–10.3)
Chloride: 90 mmol/L — ABNORMAL LOW (ref 98–111)
Creatinine, Ser: 1.46 mg/dL — ABNORMAL HIGH (ref 0.44–1.00)
GFR calc Af Amer: 42 mL/min — ABNORMAL LOW (ref >60)
GFR calc non Af Amer: 36 mL/min — ABNORMAL LOW (ref >60)
Glucose, Bld: 139 mg/dL — ABNORMAL HIGH (ref 70–99)
Potassium: 3.2 mmol/L — ABNORMAL LOW (ref 3.5–5.1)
Sodium: 138 mmol/L (ref 135–145)
Total Bilirubin: 0.7 mg/dL (ref 0.3–1.2)
Total Protein: 7.1 g/dL (ref 6.5–8.1)

## 2018-11-16 LAB — CBC
HCT: 31.5 % — ABNORMAL LOW (ref 36.0–46.0)
Hemoglobin: 9.7 g/dL — ABNORMAL LOW (ref 12.0–15.0)
MCH: 27.6 pg (ref 26.0–34.0)
MCHC: 30.8 g/dL (ref 30.0–36.0)
MCV: 89.7 fL (ref 80.0–100.0)
Platelets: 435 10*3/uL — ABNORMAL HIGH (ref 150–400)
RBC: 3.51 MIL/uL — ABNORMAL LOW (ref 3.87–5.11)
RDW: 17.1 % — ABNORMAL HIGH (ref 11.5–15.5)
WBC: 12.1 10*3/uL — ABNORMAL HIGH (ref 4.0–10.5)
nRBC: 0 % (ref 0.0–0.2)

## 2018-11-16 NOTE — Progress Notes (Addendum)
Pulmonary Critical Care Medicine [facility] [place]   PULMONARY CRITICAL CARE SERVICE  PROGRESS NOTE  Date of Service: 11/16/2018  Shannon Lang  [id number]:[id number]  DOB: [birth year]   DOA: 10/17/2018  Referring Physician: Carron Curie, MD  HPI: Shannon Lang is a 73 y.o. female seen for follow up of Acute on Chronic Respiratory Failure.  Patient 16-hour goal today on pressure support 16/5 with an FiO2 of 30%.  Resting calmly with no distress at this time.    Medications: Reviewed on Rounds  Physical Exam:  Vitals: Pulse 54 respirations 18 BP 109/54 O2 sat he 6% temp 98.4  Ventilator Settings per support 16/5 FiO2 of 30%  . General: Comfortable at this time . Eyes: Grossly normal lids, irises & conjunctiva . ENT: grossly tongue is normal . Neck: no obvious mass . Cardiovascular: S1 S2 normal no gallop . Respiratory: Coarse breath sounds . Abdomen: soft . Skin: no rash seen on limited exam . Musculoskeletal: not rigid . Psychiatric:unable to assess . Neurologic: no seizure no involuntary movements         Lab Data:   Basic Metabolic Panel: Recent Labs  Lab 11/10/18 0919 11/11/18 1227 11/16/18 0616  NA  --   --  138  K 3.3* 3.6 3.2*  CL  --   --  90*  CO2  --   --  33*  GLUCOSE  --   --  139*  BUN  --   --  47*  CREATININE  --   --  1.46*  CALCIUM  --   --  9.1    ABG: No results for input(s): PHART, PCO2ART, PO2ART, HCO3, O2SAT in the last 168 hours.  Liver Function Tests: Recent Labs  Lab 11/16/18 0616  AST 29  ALT 17  ALKPHOS 150*  BILITOT 0.7  PROT 7.1  ALBUMIN 2.3*   No results for input(s): LIPASE, AMYLASE in the last 168 hours. No results for input(s): AMMONIA in the last 168 hours.  CBC: Recent Labs  Lab 11/16/18 0616  WBC 12.1*  HGB 9.7*  HCT 31.5*  MCV 89.7  PLT 435*    Cardiac Enzymes: No results for input(s): CKTOTAL, CKMB, CKMBINDEX, TROPONINI in the last 168 hours.  BNP (last 3 results) No results for input(s):  BNP in the last 8760 hours.  ProBNP (last 3 results) No results for input(s): PROBNP in the last 8760 hours.  Radiological Exams: No results found.  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 Acute on chronic respiratory failure with hypoxia continue pressure control on the ventilator for 16-hour goal today.  Continue to wean as tolerated per protocol.  Continue supportive measures and pulmonary toilet. 2. Aspiration pneumonia continue supportive care 3. Atrial fibrillation rate controlled 4. Chronic congestive heart failure continue diuretics as necessary 5. Severe COPD at baseline continue present therapy   I have personally seen and evaluated the patient, evaluated laboratory and imaging results, formulated the assessment and plan and placed orders. The Patient requires high complexity decision making for assessment and support.  Case was discussed on Rounds with the Respiratory Therapy Staff  Yevonne Pax, MD [facility] Pulmonary Critical Care Medicine Sleep Medicine

## 2018-11-17 ENCOUNTER — Other Ambulatory Visit (HOSPITAL_COMMUNITY): Payer: Medicare Other

## 2018-11-17 DIAGNOSIS — I4891 Unspecified atrial fibrillation: Secondary | ICD-10-CM | POA: Diagnosis not present

## 2018-11-17 DIAGNOSIS — J690 Pneumonitis due to inhalation of food and vomit: Secondary | ICD-10-CM | POA: Diagnosis not present

## 2018-11-17 DIAGNOSIS — I5032 Chronic diastolic (congestive) heart failure: Secondary | ICD-10-CM | POA: Diagnosis not present

## 2018-11-17 DIAGNOSIS — J9621 Acute and chronic respiratory failure with hypoxia: Secondary | ICD-10-CM | POA: Diagnosis not present

## 2018-11-17 LAB — AMMONIA: Ammonia: 23 umol/L (ref 9–35)

## 2018-11-17 NOTE — Progress Notes (Addendum)
Pulmonary Critical Care Medicine [facility] [place]   PULMONARY CRITICAL CARE SERVICE  PROGRESS NOTE  Date of Service: 11/17/2018  Shannon Lang  TCY:[id number]  DOB: Nov 09, 1945   DOA: 10/17/2018  Referring Physician: Carron Curie, MD  HPI: Shannon Lang is a 73 y.o. female seen for follow up of Acute on Chronic Respiratory Failure.  Patient is now being switched to pressure support as tolerated currently on 16/5 this been reduced to 14/5 with 35% FiO2.  Doing well at this time with no distress.   Medications: Reviewed on Rounds  Physical Exam:  Vitals:  Pulse 57 respiration 17 BP 117/54 O2 sat 96% temp 97.7  Ventilator Settings Pressure support 14/5 FiO2 35%  . General: Comfortable at this time . Eyes: Grossly normal lids, irises & conjunctiva . ENT: grossly tongue is normal . Neck: no obvious mass . Cardiovascular: S1 S2 normal no gallop . Respiratory: Coarse breath sounds . Abdomen: soft . Skin: no rash seen on limited exam . Musculoskeletal: not rigid . Psychiatric:unable to assess . Neurologic: no seizure no involuntary movements         Lab Data:   Basic Metabolic Panel: Recent Labs  Lab 11/11/18 1227 11/16/18 0616  NA  --  138  K 3.6 3.2*  CL  --  90*  CO2  --  33*  GLUCOSE  --  139*  BUN  --  47*  CREATININE  --  1.46*  CALCIUM  --  9.1    ABG: No results for input(s): PHART, PCO2ART, PO2ART, HCO3, O2SAT in the last 168 hours.  Liver Function Tests: Recent Labs  Lab 11/16/18 0616  AST 29  ALT 17  ALKPHOS 150*  BILITOT 0.7  PROT 7.1  ALBUMIN 2.3*   No results for input(s): LIPASE, AMYLASE in the last 168 hours. Recent Labs  Lab 11/17/18 1435  AMMONIA 23    CBC: Recent Labs  Lab 11/16/18 0616  WBC 12.1*  HGB 9.7*  HCT 31.5*  MCV 89.7  PLT 435*    Cardiac Enzymes: No results for input(s): CKTOTAL, CKMB, CKMBINDEX, TROPONINI in the last 168 hours.  BNP (last 3 results) No results for input(s): BNP in the last 8760  hours.  ProBNP (last 3 results) No results for input(s): PROBNP in the last 8760 hours.  Radiological Exams: Dg Chest Port 1 View  Result Date: 11/17/2018 CLINICAL DATA:  Pneumonia. EXAM: PORTABLE CHEST 1 VIEW COMPARISON:  Radiograph Nov 09, 2018. FINDINGS: Stable cardiomegaly. Tracheostomy and enteric tubes are unchanged in position. No pneumothorax is noted. Minimal left basilar subsegmental atelectasis is noted. Stable right basilar opacity is noted concerning for pneumonia or edema. Minimal right pleural effusion is noted. Bony thorax is unremarkable. IMPRESSION: Stable support apparatus. Minimal left basilar subsegmental atelectasis. Stable right basilar edema or infiltrate is noted with minimal right pleural effusion. Electronically Signed   By: Lupita Raider M.D.   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 Acute on chronic respiratory failure with hypoxia continue to use pressure support as tolerated.  Continue pulmonary toilet and secretion management as well as supportive measures 2. Aspiration pneumonia continue supportive care 3. Atrial fibrillation rate controlled 4. Chronic congestive heart failure continue diuretics as  necessary 5. Severe COPD at baseline continue present therapy   I have personally seen and evaluated the patient, evaluated laboratory and imaging results, formulated the assessment and plan and placed orders. The Patient requires high complexity decision making for assessment and support.  Case was discussed on Rounds with the Respiratory Therapy Staff  Yevonne PaxSaadat A Khan, MD [facility] Pulmonary Critical Care Medicine Sleep Medicine

## 2018-11-18 DIAGNOSIS — I5032 Chronic diastolic (congestive) heart failure: Secondary | ICD-10-CM | POA: Diagnosis not present

## 2018-11-18 DIAGNOSIS — J690 Pneumonitis due to inhalation of food and vomit: Secondary | ICD-10-CM | POA: Diagnosis not present

## 2018-11-18 DIAGNOSIS — I4891 Unspecified atrial fibrillation: Secondary | ICD-10-CM | POA: Diagnosis not present

## 2018-11-18 DIAGNOSIS — J9621 Acute and chronic respiratory failure with hypoxia: Secondary | ICD-10-CM | POA: Diagnosis not present

## 2018-11-18 LAB — TRIGLYCERIDES: Triglycerides: 161 mg/dL — ABNORMAL HIGH (ref <150)

## 2018-11-18 NOTE — Progress Notes (Addendum)
Pulmonary Critical Care Medicine [facility] [place]   PULMONARY CRITICAL CARE SERVICE  PROGRESS NOTE  Date of Service: 11/18/2018  Shannon Lang  [id number]  DOB: January 19, 1946   DOA: 10/17/2018  Referring Physician: Carron Curie, MD  HPI: Shannon Lang is a 73 y.o. female seen for follow up of Acute on Chronic Respiratory Failure.  Patient was able to do 16 hours yesterday on pressure support 14/5 with an FiO2 of 30%.  Will start aerosol trach collar today at 30% to see how patient can tolerate.  Currently resting currently with no distress.  Medications: Reviewed on Rounds  Physical Exam:  Vitals: Pulse 74 respirations 24 BP 114/47 O2 sat 98% temp 98.5  Ventilator Settings ATC 30%.  . General: Comfortable at this time . Eyes: Grossly normal lids, irises & conjunctiva . ENT: grossly tongue is normal . Neck: no obvious mass . Cardiovascular: S1 S2 normal no gallop . Respiratory: Coarse breath sounds . Abdomen: soft . Skin: no rash seen on limited exam . Musculoskeletal: not rigid . Psychiatric:unable to assess . Neurologic: no seizure no involuntary movements         Lab Data:   Basic Metabolic Panel: Recent Labs  Lab 11/16/18 0616  NA 138  K 3.2*  CL 90*  CO2 33*  GLUCOSE 139*  BUN 47*  CREATININE 1.46*  CALCIUM 9.1    ABG: No results for input(s): PHART, PCO2ART, PO2ART, HCO3, O2SAT in the last 168 hours.  Liver Function Tests: Recent Labs  Lab 11/16/18 0616  AST 29  ALT 17  ALKPHOS 150*  BILITOT 0.7  PROT 7.1  ALBUMIN 2.3*   No results for input(s): LIPASE, AMYLASE in the last 168 hours. Recent Labs  Lab 11/17/18 1435  AMMONIA 23    CBC: Recent Labs  Lab 11/16/18 0616  WBC 12.1*  HGB 9.7*  HCT 31.5*  MCV 89.7  PLT 435*    Cardiac Enzymes: No results for input(s): CKTOTAL, CKMB, CKMBINDEX, TROPONINI in the last 168 hours.  BNP (last 3 results) No results for input(s): BNP in the last 8760 hours.  ProBNP (last 3  results) No results for input(s): PROBNP in the last 8760 hours.  Radiological Exams: Dg Chest Port 1 View  Result Date: 11/17/2018 CLINICAL DATA:  Pneumonia. EXAM: PORTABLE CHEST 1 VIEW COMPARISON:  Radiograph Nov 09, 2018. FINDINGS: Stable cardiomegaly. Tracheostomy and enteric tubes are unchanged in position. No pneumothorax is noted. Minimal left basilar subsegmental atelectasis is noted. Stable right basilar opacity is noted concerning for pneumonia or edema. Minimal right pleural effusion is noted. Bony thorax is unremarkable. IMPRESSION: Stable support apparatus. Minimal left basilar subsegmental atelectasis. Stable right basilar edema or infiltrate is noted with minimal right pleural effusion. Electronically Signed   By: Lupita Raider M.D.   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 Acute on chronic respiratory failure with hypoxia continue to use aerosol trach collar as tolerated today.  Continue pulmonary toilet secretion management 2. Aspiration pneumonia continue supportive care 3. Atrial fibrillation rate controlled 4. Chronic congestive heart failure continue diuretics as necessary 5. Severe COPD at baseline continue present therapy   I have personally seen and evaluated the patient, evaluated laboratory and imaging results,  formulated the assessment and plan and placed orders. The Patient requires high complexity decision making for assessment and support.  Case was discussed on Rounds with the Respiratory Therapy Staff  Allyne Gee, MD [facility] Pulmonary Critical Care Medicine Sleep Medicine

## 2018-11-19 DIAGNOSIS — I4891 Unspecified atrial fibrillation: Secondary | ICD-10-CM | POA: Diagnosis not present

## 2018-11-19 DIAGNOSIS — J690 Pneumonitis due to inhalation of food and vomit: Secondary | ICD-10-CM | POA: Diagnosis not present

## 2018-11-19 DIAGNOSIS — I5032 Chronic diastolic (congestive) heart failure: Secondary | ICD-10-CM | POA: Diagnosis not present

## 2018-11-19 DIAGNOSIS — J9621 Acute and chronic respiratory failure with hypoxia: Secondary | ICD-10-CM | POA: Diagnosis not present

## 2018-11-19 NOTE — Progress Notes (Addendum)
Pulmonary Critical Care Medicine [facility] [place]   PULMONARY CRITICAL CARE SERVICE  PROGRESS NOTE  Date of Service: 11/19/2018  Shannon Lang  [id number]  DOB: [birth year]   DOA: 10/17/2018  Referring Physician: Carron Curie, MD  HPI: Shannon Lang is a 73 y.o. female seen for follow up of Acute on Chronic Respiratory Failure.  Patient was on aerosol trach collar 12/5 with an FiO2 of 40% until lunchtime today and is now on aerosol trach collar 40% and doing well at this time.  No desaturations noted.  Medications: Reviewed on Rounds  Physical Exam:  Vitals: Pulse 68 respirations 40 BP 127/69 O2 sat 99% temp 97.8  Ventilator Settings ATC 40%  . General: Comfortable at this time . Eyes: Grossly normal lids, irises & conjunctiva . ENT: grossly tongue is normal . Neck: no obvious mass . Cardiovascular: S1 S2 normal no gallop . Respiratory: Coarse breath sounds . Abdomen: soft . Skin: no rash seen on limited exam . Musculoskeletal: not rigid . Psychiatric:unable to assess . Neurologic: no seizure no involuntary movements         Lab Data:   Basic Metabolic Panel: Recent Labs  Lab 11/16/18 0616  NA 138  K 3.2*  CL 90*  CO2 33*  GLUCOSE 139*  BUN 47*  CREATININE 1.46*  CALCIUM 9.1    ABG: No results for input(s): PHART, PCO2ART, PO2ART, HCO3, O2SAT in the last 168 hours.  Liver Function Tests: Recent Labs  Lab 11/16/18 0616  AST 29  ALT 17  ALKPHOS 150*  BILITOT 0.7  PROT 7.1  ALBUMIN 2.3*   No results for input(s): LIPASE, AMYLASE in the last 168 hours. Recent Labs  Lab 11/17/18 1435  AMMONIA 23    CBC: Recent Labs  Lab 11/16/18 0616  WBC 12.1*  HGB 9.7*  HCT 31.5*  MCV 89.7  PLT 435*    Cardiac Enzymes: No results for input(s): CKTOTAL, CKMB, CKMBINDEX, TROPONINI in the last 168 hours.  BNP (last 3 results) No results for input(s): BNP in the last 8760 hours.  ProBNP (last 3 results) No results for input(s): PROBNP  in the last 8760 hours.  Radiological Exams: No results found.  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 Acute on chronic respiratory failure with hypoxia continue use aerosol trach collar as tolerated.  Continue secretion management and pulmonary toilet 2. Aspiration pneumonia continue supportive care 3. Atrial fibrillation rate controlled 4. Chronic congestive heart failure continue diuretics as necessary 5. Severe COPD at baseline continue present therapy   I have personally seen and evaluated the patient, evaluated laboratory and imaging results, formulated the assessment and plan and placed orders. The Patient requires high complexity decision making for assessment and support.  Case was discussed on Rounds with the Respiratory Therapy Staff  Yevonne Pax, MD [facility] Pulmonary Critical Care Medicine Sleep Medicine

## 2018-11-20 ENCOUNTER — Other Ambulatory Visit (HOSPITAL_COMMUNITY): Payer: Medicare Other

## 2018-11-20 DIAGNOSIS — I4891 Unspecified atrial fibrillation: Secondary | ICD-10-CM | POA: Diagnosis not present

## 2018-11-20 DIAGNOSIS — J690 Pneumonitis due to inhalation of food and vomit: Secondary | ICD-10-CM | POA: Diagnosis not present

## 2018-11-20 DIAGNOSIS — J9621 Acute and chronic respiratory failure with hypoxia: Secondary | ICD-10-CM | POA: Diagnosis not present

## 2018-11-20 DIAGNOSIS — I5032 Chronic diastolic (congestive) heart failure: Secondary | ICD-10-CM | POA: Diagnosis not present

## 2018-11-20 LAB — BASIC METABOLIC PANEL
Anion gap: 12 (ref 5–15)
BUN: 53 mg/dL — ABNORMAL HIGH (ref 8–23)
CO2: 33 mmol/L — ABNORMAL HIGH (ref 22–32)
Calcium: 9.8 mg/dL (ref 8.9–10.3)
Chloride: 95 mmol/L — ABNORMAL LOW (ref 98–111)
Creatinine, Ser: 1.47 mg/dL — ABNORMAL HIGH (ref 0.44–1.00)
GFR calc Af Amer: 41 mL/min — ABNORMAL LOW (ref >60)
GFR calc non Af Amer: 36 mL/min — ABNORMAL LOW (ref >60)
Glucose, Bld: 168 mg/dL — ABNORMAL HIGH (ref 70–99)
Potassium: 3.8 mmol/L (ref 3.5–5.1)
Sodium: 140 mmol/L (ref 135–145)

## 2018-11-20 LAB — CBC
HCT: 33.1 % — ABNORMAL LOW (ref 36.0–46.0)
Hemoglobin: 10.2 g/dL — ABNORMAL LOW (ref 12.0–15.0)
MCH: 28 pg (ref 26.0–34.0)
MCHC: 30.8 g/dL (ref 30.0–36.0)
MCV: 90.9 fL (ref 80.0–100.0)
Platelets: 343 10*3/uL (ref 150–400)
RBC: 3.64 MIL/uL — ABNORMAL LOW (ref 3.87–5.11)
RDW: 16.2 % — ABNORMAL HIGH (ref 11.5–15.5)
WBC: 14.1 10*3/uL — ABNORMAL HIGH (ref 4.0–10.5)
nRBC: 0 % (ref 0.0–0.2)

## 2018-11-20 NOTE — Consult Note (Signed)
Chief Complaint: Patient was seen in consultation today for percutaneous gastric tube placement at the request ofDr Theora Gianotti  Supervising Physician: Simonne Come  Patient Status: Select IP  History of Present Illness: Shannon Lang is a 73 y.o. female   Afib (no AC); DM COPD; Hypoxia Resp failure Aspiration Deconditioning  Malnutrition Need for long term care Request for percutaneous gastric tube placement  Dr Grace Isaac has reviewed imaging and approves procedure  Past Medical History:  Diagnosis Date   Acute on chronic respiratory failure with hypoxia (HCC)    Aspiration pneumonia due to gastric secretions (HCC)    Atrial fibrillation with RVR (HCC)    Chronic congestive heart failure with left ventricular diastolic dysfunction (HCC)    COPD, severe (HCC)     Allergies: Patient has no allergy information on record.  Medications: Prior to Admission medications   Not on File     No family history on file.  Social History   Socioeconomic History   Marital status: Single    Spouse name: Not on file   Number of children: Not on file   Years of education: Not on file   Highest education level: Not on file  Occupational History   Not on file  Social Needs   Financial resource strain: Not on file   Food insecurity:    Worry: Not on file    Inability: Not on file   Transportation needs:    Medical: Not on file    Non-medical: Not on file  Tobacco Use   Smoking status: Not on file  Substance and Sexual Activity   Alcohol use: Not on file   Drug use: Not on file   Sexual activity: Not on file  Lifestyle   Physical activity:    Days per week: Not on file    Minutes per session: Not on file   Stress: Not on file  Relationships   Social connections:    Talks on phone: Not on file    Gets together: Not on file    Attends religious service: Not on file    Active member of club or organization: Not on file    Attends meetings of clubs  or organizations: Not on file    Relationship status: Not on file  Other Topics Concern   Not on file  Social History Narrative   Not on file     Review of Systems: A 12 point ROS discussed and pertinent positives are indicated in the HPI above.  All other systems are negative.   Vital Signs: LMP  (LMP Unknown)   Physical Exam Vitals signs reviewed.  Constitutional:      Appearance: She is obese. She is ill-appearing.  Cardiovascular:     Rate and Rhythm: Normal rate and regular rhythm.     Heart sounds: Normal heart sounds.  Pulmonary:     Breath sounds: Normal breath sounds.  Abdominal:     General: Bowel sounds are normal.  Musculoskeletal:     Comments: Moving all 4s - does not follow commands  Skin:    General: Skin is warm and dry.  Neurological:     Mental Status: She is disoriented.  Psychiatric:     Comments: Getting consent from Dtr Shannon Lang asap NA     Imaging: Ct Abdomen Wo Contrast  Result Date: 11/20/2018 CLINICAL DATA:  Evaluate anatomy prior to potential percutaneous gastrostomy tube placement. EXAM: CT ABDOMEN WITHOUT CONTRAST TECHNIQUE: Multidetector CT imaging of the abdomen was  performed following the standard protocol without IV contrast. COMPARISON:  None. FINDINGS: Lower chest: Limited visualization of the lower thorax demonstrates bibasilar subsegmental atelectasis. No definite pleural effusion. Cardiomegaly.  No pericardial effusion. Hepatobiliary: Mild nodularity of the hepatic contour. Post cholecystectomy. No ascites. Pancreas: The pancreas appears atrophic. Spleen: Normal noncontrast appearance of the spleen. Adrenals/Urinary Tract: Suspected mild thinning of the bilateral renal cortices. No renal stones. There is a minimal amount of grossly symmetric bilateral perinephric stranding. No urinary obstruction. Normal noncontrast appearance the bilateral adrenal glands. Only the dome of the urinary bladder is imaged. Stomach/Bowel: Enteric tube  terminates within the mid body of the stomach. The anterior wall the stomach is well apposed against the ventral wall of the upper abdomen without interposed liver or transverse colon. Suspected duodenal diverticulum. No evidence of enteric obstruction. Vascular/Lymphatic: Atherosclerotic plaque within a normal caliber abdominal aorta. No bulky retroperitoneal or mesenteric adenopathy within the imaged abdomen. Other: There is a macrolobulated mixed attenuating fluid collection within the subcutaneous tissues of the midline the lower chest which measures at least 15.3 x 4.2 cm diameter. Metallic sutures are noted about the ventral aspect the right upper abdominal wall. Musculoskeletal: No acute or aggressive osseous abnormalities. Stigmata of DISH within thoracic spine. IMPRESSION: 1. Gastric anatomy amenable to potential percutaneous gastrostomy tube placement as indicated. 2. Indeterminate macrolobulated mixed attenuating apparent fluid collection within the subcutaneous tissues of the midline of the lower chest, nonspecific though potentially representative of a hematoma, potentially the sequela of recent chest compressions? Clinical correlation is advised. 3. Cardiomegaly. 4.  Aortic Atherosclerosis (ICD10-I70.0). Electronically Signed   By: Simonne Come M.D.   On: 11/20/2018 13:03   Dg Abd 1 View  Result Date: 10/25/2018 CLINICAL DATA:  NG tube placement EXAM: ABDOMEN - 1 VIEW COMPARISON:  None. FINDINGS: An NG tube is identified with tip overlying the distal stomach. IMPRESSION: NG tube with tip overlying the distal stomach. Electronically Signed   By: Harmon Pier M.D.   On: 10/25/2018 15:20   Dg Chest Port 1 View  Result Date: 11/17/2018 CLINICAL DATA:  Pneumonia. EXAM: PORTABLE CHEST 1 VIEW COMPARISON:  Radiograph Nov 09, 2018. FINDINGS: Stable cardiomegaly. Tracheostomy and enteric tubes are unchanged in position. No pneumothorax is noted. Minimal left basilar subsegmental atelectasis is noted. Stable  right basilar opacity is noted concerning for pneumonia or edema. Minimal right pleural effusion is noted. Bony thorax is unremarkable. IMPRESSION: Stable support apparatus. Minimal left basilar subsegmental atelectasis. Stable right basilar edema or infiltrate is noted with minimal right pleural effusion. Electronically Signed   By: Lupita Raider M.D.   On: 11/17/2018 10:33   Dg Chest Port 1 View  Result Date: 11/09/2018 CLINICAL DATA:  Respiratory failure. EXAM: PORTABLE CHEST 1 VIEW COMPARISON:  Chest x-ray dated October 28, 2018. FINDINGS: Interval placement of a tracheostomy tube with the tip at the thoracic inlet. Unchanged enteric tube with the tip below the field of view. Interval removal of the left upper extremity PICC line. Stable cardiomediastinal silhouette. Normal pulmonary vascularity. Improved bilateral streaky opacities with some residual right perihilar and basilar opacity. Unchanged small right pleural effusion. No pneumothorax. No acute osseous abnormality. IMPRESSION: 1. Improved aeration bilaterally with residual right perihilar and basilar atelectasis. 2. Unchanged small right pleural effusion. Electronically Signed   By: Obie Dredge M.D.   On: 11/09/2018 07:24   Dg Chest Port 1 View  Result Date: 10/28/2018 CLINICAL DATA:  73 year old female with worsening respiratory status. EXAM: PORTABLE CHEST 1 VIEW  COMPARISON:  10/26/2018 and earlier. FINDINGS: Portable AP semi upright view at 1211 hours. Stable endotracheal tube tip just below the clavicles. Enteric tube courses to the left upper quadrant, tip not included. Patient is less rotated today. Stable left PICC line. Stable lung volumes. Stable cardiac size and mediastinal contours. No pneumothorax. Streaky bilateral perihilar and infrahilar opacity. Ventilation in the right lung appears stable to mildly improved since 10/24/2018. However left lung opacity has mildly increased. No definite pleural effusion. Paucity of bowel gas in  the upper abdomen. IMPRESSION: 1.  Stable lines and tubes. 2. Nonspecific streaky perihilar and lung base opacity. Right lung ventilation appears mildly improved since 10/24/2018 while left lung ventilation has mildly worsened. Electronically Signed   By: Odessa Fleming M.D.   On: 10/28/2018 11:45   Dg Chest Port 1 View  Result Date: 10/26/2018 CLINICAL DATA:  73 year old female with respiratory failure EXAM: PORTABLE CHEST 1 VIEW COMPARISON:  Prior chest x-ray 10/24/2018 FINDINGS: The patient remains intubated. The tip of the endotracheal tube is 3 cm above the carina. The patient remains rotated toward the right which distorts the cardiac and mediastinal contours. However, there has been no interval change in the configuration to suggest an acute abnormality. A gastric tube remains present. The tip of the tube is not visualized but lies inferiorly off the field of view, presumably within the stomach. A left upper extremity PICC remains in place. The catheter tip overlies the upper SVC. Overall, slightly lower inspiratory volumes on today's exam, particularly in the right lung base suggesting an increase in the degree of atelectasis. Otherwise, the background of patchy right perihilar and right lower lobe airspace opacities, small bilateral layering pleural effusions and diffuse mild interstitial prominence are all similar. No evidence of pneumothorax. No acute osseous abnormality. IMPRESSION: 1. Slightly decreased lung volumes, particularly in the right base commensurate with a slight increase in atelectasis. 2. Otherwise, no significant interval change in the appearance of the chest. 3. Stable and satisfactory support apparatus. Electronically Signed   By: Malachy Moan M.D.   On: 10/26/2018 07:57   Dg Chest Port 1 View  Result Date: 10/24/2018 CLINICAL DATA:  Respiratory failure. EXAM: PORTABLE CHEST 1 VIEW COMPARISON:  One-view chest x-ray 10/23/2018 FINDINGS: The heart is enlarged. Endotracheal tube  terminates 3.4 cm above the carina. There is marked improved aeration of both lungs. No pneumothorax is present. Residual basilar airspace disease is noted, right greater than left. Resolving right-sided subcutaneous emphysema is noted. IMPRESSION: 1. Improving airspace disease bilaterally. 2. Residual bibasilar airspace disease is worse on the right. 3. Resolving subcutaneous emphysema. 4. Support apparatus is stable. Electronically Signed   By: Marin Roberts M.D.   On: 10/24/2018 06:56   Dg Chest Port 1 View  Result Date: 10/23/2018 CLINICAL DATA:  Status post bronchoscopy. EXAM: PORTABLE CHEST 1 VIEW COMPARISON:  10/23/2018 at 1318 hours FINDINGS: Endotracheal tube terminates at the clavicular heads. Enteric tube courses off the inferior margin of the image. There is significant rightward patient rotation on the current examination with pneumomediastinum less distinctly shown than on the prior study. There is new soft tissue emphysema in the right chest wall. Extensive airspace opacity remains throughout the left lung, mildly improved from the prior study. There is increasing patchy opacity throughout the right lung. A small left apical pneumothorax is suspected. Soft tissue emphysema is again seen in the lower neck. IMPRESSION: 1. Rotated examination with mildly improved aeration of the left lung and worsening right lung aeration. 2.  Suspected small left apical pneumothorax. 3. New soft tissue emphysema in the right chest wall. These results will be called to the ordering clinician or representative by the Radiologist Assistant, and communication documented in the PACS or zVision Dashboard. Electronically Signed   By: Sebastian AcheAllen  Grady M.D.   On: 10/23/2018 15:38   Dg Chest Port 1 View  Result Date: 10/23/2018 CLINICAL DATA:  73 year old female with endotracheal tube adjustment EXAM: PORTABLE CHEST 1 VIEW COMPARISON:  Multiple prior most recent 10/23/2018, and 10/20/2018 FINDINGS: Endotracheal tube  terminates 3 cm above the carina at the clavicular heads. Unchanged left upper extremity PICC. Gastric tube terminates at the GE junction. Similar appearance of mediastinal gas, and gas within the fascial planes of the base of the neck. Redemonstration of gas along the left mediastinum outlining the cardiomediastinal silhouette. Similar appearance of complete opacification of the left hemithorax with no residual aerated lung. Right lung remains similarly aerated, with blunting of the right costophrenic angle and no pneumothorax. Reticular opacity in the hilar, suprahilar, infrahilar region, difficult to exclude interstitial emphysema. Defibrillator pads project over the lower chest. IMPRESSION: Endotracheal tube terminates 3 cm above the carina, at the level of the clavicular heads. Similar appearance of complete opacification of the left hemithorax with no residual aerated lung. Differential includes pleural fluid/hematoma, or potentially subpleural fluid/hematoma, with complete atelectasis/collapse. These results were discussed by telephone at the time of interpretation on 10/23/2018 at 1:58 pm with Dr. Freda MunroSAADAT KHAN Similar appearance of the right lung with persisting mixed interstitial and airspace opacity and small pleural effusion. Small amount of interstitial and emphysema cannot be excluded. Gastric tube terminates at the GE junction. Electronically Signed   By: Gilmer MorJaime  Wagner D.O.   On: 10/23/2018 14:08   Dg Chest Port 1 View  Result Date: 10/23/2018 CLINICAL DATA:  Endotracheal tube placement. EXAM: PORTABLE CHEST 1 VIEW COMPARISON:  Radiograph of October 20, 2018. FINDINGS: Stable cardiomegaly. Endotracheal tube is in grossly good position. Distal tip of nasogastric tube appears to be above the gastroesophageal junction. No definite pneumothorax is noted. Mild right basilar pleural effusion is noted. There is interval development of pneumomediastinum. Complete opacification of left lung is noted concerning  for atelectasis or effusion. Subcutaneous emphysema is seen in the cervical soft tissues bilaterally as well. Bony thorax is unremarkable. IMPRESSION: Interval development of large pneumomediastinum is noted with extension into the cervical soft tissues. This is concerning for tracheal or bronchial injury. Endotracheal tube is in grossly good position. Distal tip of nasogastric tube appears to be above gastroesophageal junction. There is also noted interval development of complete opacification of left lung which may represent atelectasis, pneumonia or effusion. CT scan may be performed for further evaluation. Mild right pleural effusion is noted with stable diffuse right lung opacity concerning for pneumonia or edema. These results will be called to the ordering clinician or representative by the Radiologist Assistant, and communication documented in the PACS or zVision Dashboard. Electronically Signed   By: Lupita RaiderJames  Green Jr M.D.   On: 10/23/2018 12:44   Dg Abd Portable 1v  Result Date: 11/06/2018 CLINICAL DATA:  Nasogastric tube placement EXAM: PORTABLE ABDOMEN - 1 VIEW COMPARISON:  November 06, 2018 study obtained earlier in the day FINDINGS: Nasogastric tube tip and side port are in the distal body of the stomach. There is no bowel dilatation or air-fluid level to suggest bowel obstruction. No evident free air. Visualized lung bases clear. IMPRESSION: Nasogastric tube tip and side port now in distal body of  stomach region. No bowel obstruction or free air evident. Visualized lung bases clear. Electronically Signed   By: Bretta Bang III M.D.   On: 11/06/2018 15:44   Dg Abd Portable 1v  Result Date: 11/06/2018 CLINICAL DATA:  Nasogastric tube placement. EXAM: PORTABLE ABDOMEN - 1 VIEW COMPARISON:  Radiograph of October 25, 2018. FINDINGS: The bowel gas pattern is normal. Nasogastric tube tip is seen at expected position of gastroesophageal junction, with side hole in distal esophagus. No radio-opaque  calculi or other significant radiographic abnormality are seen. IMPRESSION: Nasogastric tube tip seen in expected position of gastroesophageal junction; advancement is recommended. No evidence of bowel obstruction or ileus. Electronically Signed   By: Lupita Raider M.D.   On: 11/06/2018 15:38    Labs:  CBC: Recent Labs    11/05/18 1431 11/09/18 0600 11/16/18 0616 11/20/18 0728  WBC 10.5 9.1 12.1* 14.1*  HGB 8.6* 8.8* 9.7* 10.2*  HCT 28.2* 28.7* 31.5* 33.1*  PLT 493* 515* 435* 343    COAGS: Recent Labs    10/18/18 0351  INR 1.1    BMP: Recent Labs    11/05/18 0459 11/09/18 0600 11/10/18 0919 11/11/18 1227 11/16/18 0616 11/20/18 0728  NA 138 138  --   --  138 140  K 3.9 3.2* 3.3* 3.6 3.2* 3.8  CL 91* 95*  --   --  90* 95*  CO2 36* 32  --   --  33* 33*  GLUCOSE 83 81  --   --  139* 168*  BUN 31* 43*  --   --  47* 53*  CALCIUM 8.8* 8.7*  --   --  9.1 9.8  CREATININE 1.51* 1.42*  --   --  1.46* 1.47*  GFRNONAA 35* 37*  --   --  36* 36*  GFRAA 40* 43*  --   --  42* 41*    LIVER FUNCTION TESTS: Recent Labs    10/18/18 0351 11/16/18 0616  BILITOT 0.6 0.7  AST 18 29  ALT 11 17  ALKPHOS 97 150*  PROT 6.3* 7.1  ALBUMIN 2.0* 2.3*    TUMOR MARKERS: No results for input(s): AFPTM, CEA, CA199, CHROMGRNA in the last 8760 hours.  Assessment and Plan:  Hypoxia Deconditioning Aspiration Need for long term care Scheduled for percutaneous gastric tube placement Attempting to get hold of Dtr Shannon Lang by phone for consent  Thank you for this interesting consult.  I greatly enjoyed meeting Shannon Lang and look forward to participating in their care.  A copy of this report was sent to the requesting provider on this date.  Electronically Signed: Robet Leu, PA-C 11/20/2018, 2:56 PM   I spent a total of 40 Minutes    in face to face in clinical consultation, greater than 50% of which was counseling/coordinating care for percutaneous gastric tube placement

## 2018-11-20 NOTE — Progress Notes (Signed)
[facility]ulmonary Critical Care Medicine [facility]   PULMONARY CRITICAL CARE SERVICE  PROGRESS NOTE  Date of Service: 11/20/2018  Shannon Lang  [id number]  DOB: 10/27/1945   DOA: 10/17/2018  Referring Physician: Carron CurieAli Hijazi, MD  HPI: Shannon Lang is a 73 y.o. female seen for follow up of Acute on Chronic Respiratory Failure.  Patient is on T collar now on 40% FiO2 with a goal of 12 hours patient had a CT scan of the chest lower chest show some areas of mucoid impaction with her previous history of severe atelectasis and respiratory failure recommend a bronchoscopy for reassessment since she is doing much better and weaning  Medications: Reviewed on Rounds  Physical Exam:  Vitals: Temperature 98.1 pulse 78 respiratory rate 20 blood pressure 98/63 saturations 96%  Ventilator Settings currently on T collar FiO2 is 40%  . General: Comfortable at this time . Eyes: Grossly normal lids, irises & conjunctiva . ENT: grossly tongue is normal . Neck: no obvious mass . Cardiovascular: S1 S2 normal no gallop . Respiratory: No rhonchi no rales are noted at this time . Abdomen: soft . Skin: no rash seen on limited exam . Musculoskeletal: not rigid . Psychiatric:unable to assess . Neurologic: no seizure no involuntary movements         Lab Data:   Basic Metabolic Panel: Recent Labs  Lab 11/16/18 0616 11/20/18 0728  NA 138 140  K 3.2* 3.8  CL 90* 95*  CO2 33* 33*  GLUCOSE 139* 168*  BUN 47* 53*  CREATININE 1.46* 1.47*  CALCIUM 9.1 9.8    ABG: No results for input(s): PHART, PCO2ART, PO2ART, HCO3, O2SAT in the last 168 hours.  Liver Function Tests: Recent Labs  Lab 11/16/18 0616  AST 29  ALT 17  ALKPHOS 150*  BILITOT 0.7  PROT 7.1  ALBUMIN 2.3*   No results for input(s): LIPASE, AMYLASE in the last 168 hours. Recent Labs  Lab 11/17/18 1435  AMMONIA 23    CBC: Recent Labs  Lab 11/16/18 0616 11/20/18 0728  WBC 12.1* 14.1*  HGB 9.7* 10.2*   HCT 31.5* 33.1*  MCV 89.7 90.9  PLT 435* 343    Cardiac Enzymes: No results for input(s): CKTOTAL, CKMB, CKMBINDEX, TROPONINI in the last 168 hours.  BNP (last 3 results) No results for input(s): BNP in the last 8760 hours.  ProBNP (last 3 results) No results for input(s): PROBNP in the last 8760 hours.  Radiological Exams: Ct Abdomen Wo Contrast  Result Date: 11/20/2018 CLINICAL DATA:  Evaluate anatomy prior to potential percutaneous gastrostomy tube placement. EXAM: CT ABDOMEN WITHOUT CONTRAST TECHNIQUE: Multidetector CT imaging of the abdomen was performed following the standard protocol without IV contrast. COMPARISON:  None. FINDINGS: Lower chest: Limited visualization of the lower thorax demonstrates bibasilar subsegmental atelectasis. No definite pleural effusion. Cardiomegaly.  No pericardial effusion. Hepatobiliary: Mild nodularity of the hepatic contour. Post cholecystectomy. No ascites. Pancreas: The pancreas appears atrophic. Spleen: Normal noncontrast appearance of the spleen. Adrenals/Urinary Tract: Suspected mild thinning of the bilateral renal cortices. No renal stones. There is a minimal amount of grossly symmetric bilateral perinephric stranding. No urinary obstruction. Normal noncontrast appearance the bilateral adrenal glands. Only the dome of the urinary bladder is imaged. Stomach/Bowel: Enteric tube terminates within the mid body of the stomach. The anterior wall the stomach is well apposed against the ventral wall of the upper abdomen without interposed liver or transverse colon. Suspected duodenal diverticulum. No evidence of enteric obstruction. Vascular/Lymphatic: Atherosclerotic plaque within  a normal caliber abdominal aorta. No bulky retroperitoneal or mesenteric adenopathy within the imaged abdomen. Other: There is a macrolobulated mixed attenuating fluid collection within the subcutaneous tissues of the midline the lower chest which measures at least 15.3 x 4.2 cm  diameter. Metallic sutures are noted about the ventral aspect the right upper abdominal wall. Musculoskeletal: No acute or aggressive osseous abnormalities. Stigmata of DISH within thoracic spine. IMPRESSION: 1. Gastric anatomy amenable to potential percutaneous gastrostomy tube placement as indicated. 2. Indeterminate macrolobulated mixed attenuating apparent fluid collection within the subcutaneous tissues of the midline of the lower chest, nonspecific though potentially representative of a hematoma, potentially the sequela of recent chest compressions? Clinical correlation is advised. 3. Cardiomegaly. 4.  Aortic Atherosclerosis (ICD10-I70.0). Electronically Signed   By: Simonne Come M.D.   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 Acute on chronic respiratory failure hypoxia continue with the weaning process also will schedule for bronchoscopy because of the increased mucus plugging that is been noted. 2. Aspiration pneumonia treated we will continue with supportive care 3. Atrial fibrillation right now rate controlled 4. Chronic congestive heart failure at baseline we will continue present therapy 5. Severe COPD at baseline continue to monitor   I have personally seen and evaluated the patient, evaluated laboratory and imaging results, formulated the assessment and plan and placed orders. The Patient requires high complexity decision making for assessment and support.  Case was discussed on Rounds with the Respiratory Therapy Staff  Yevonne Pax, MD [facility] Pulmonary Critical Care Medicine Sleep Medicine

## 2018-11-21 ENCOUNTER — Other Ambulatory Visit (HOSPITAL_COMMUNITY): Payer: Medicare Other

## 2018-11-21 ENCOUNTER — Encounter (HOSPITAL_COMMUNITY): Payer: Self-pay | Admitting: Interventional Radiology

## 2018-11-21 DIAGNOSIS — J690 Pneumonitis due to inhalation of food and vomit: Secondary | ICD-10-CM | POA: Diagnosis not present

## 2018-11-21 DIAGNOSIS — I4891 Unspecified atrial fibrillation: Secondary | ICD-10-CM | POA: Diagnosis not present

## 2018-11-21 DIAGNOSIS — J9621 Acute and chronic respiratory failure with hypoxia: Secondary | ICD-10-CM | POA: Diagnosis not present

## 2018-11-21 DIAGNOSIS — I5032 Chronic diastolic (congestive) heart failure: Secondary | ICD-10-CM | POA: Diagnosis not present

## 2018-11-21 HISTORY — PX: IR GASTROSTOMY TUBE MOD SED: IMG625

## 2018-11-21 LAB — PROTIME-INR
INR: 1.2 (ref 0.8–1.2)
Prothrombin Time: 14.6 seconds (ref 11.4–15.2)

## 2018-11-21 LAB — CBC
HCT: 35.2 % — ABNORMAL LOW (ref 36.0–46.0)
Hemoglobin: 10.6 g/dL — ABNORMAL LOW (ref 12.0–15.0)
MCH: 27.3 pg (ref 26.0–34.0)
MCHC: 30.1 g/dL (ref 30.0–36.0)
MCV: 90.7 fL (ref 80.0–100.0)
Platelets: 349 10*3/uL (ref 150–400)
RBC: 3.88 MIL/uL (ref 3.87–5.11)
RDW: 15.9 % — ABNORMAL HIGH (ref 11.5–15.5)
WBC: 10.3 10*3/uL (ref 4.0–10.5)
nRBC: 0 % (ref 0.0–0.2)

## 2018-11-21 LAB — SARS CORONAVIRUS 2 BY RT PCR (HOSPITAL ORDER, PERFORMED IN ~~LOC~~ HOSPITAL LAB): SARS Coronavirus 2: NEGATIVE

## 2018-11-21 MED ORDER — FENTANYL CITRATE (PF) 100 MCG/2ML IJ SOLN
INTRAMUSCULAR | Status: AC | PRN
  Administered 2018-11-21: 25 ug via INTRAVENOUS
  Administered 2018-11-21: 50 ug via INTRAVENOUS
  Administered 2018-11-21 (×2): 25 ug via INTRAVENOUS
  Administered 2018-11-21: 50 ug via INTRAVENOUS
  Administered 2018-11-21: 25 ug via INTRAVENOUS

## 2018-11-21 MED ORDER — CEFAZOLIN SODIUM-DEXTROSE 2-4 GM/100ML-% IV SOLN
INTRAVENOUS | Status: AC
  Filled 2018-11-21: qty 100

## 2018-11-21 MED ORDER — MIDAZOLAM HCL 2 MG/2ML IJ SOLN
INTRAMUSCULAR | Status: AC
  Filled 2018-11-21: qty 2

## 2018-11-21 MED ORDER — FENTANYL CITRATE (PF) 100 MCG/2ML IJ SOLN
INTRAMUSCULAR | Status: AC
  Filled 2018-11-21: qty 2

## 2018-11-21 MED ORDER — GLUCAGON HCL RDNA (DIAGNOSTIC) 1 MG IJ SOLR
INTRAMUSCULAR | Status: AC
  Filled 2018-11-21: qty 1

## 2018-11-21 MED ORDER — IOHEXOL 350 MG/ML SOLN: 50.0000 mL | Freq: Once | INTRAVENOUS | Status: DC | PRN

## 2018-11-21 MED ORDER — LIDOCAINE VISCOUS HCL 2 % MT SOLN
OROMUCOSAL | Status: AC
  Filled 2018-11-21: qty 15

## 2018-11-21 MED ORDER — IOHEXOL 300 MG/ML  SOLN
50.0000 mL | Freq: Once | INTRAMUSCULAR | Status: AC | PRN
  Administered 2018-11-21: 15 mL via INTRAVENOUS

## 2018-11-21 MED ORDER — LIDOCAINE HCL 1 % IJ SOLN
INTRAMUSCULAR | Status: AC
  Filled 2018-11-21: qty 20

## 2018-11-21 MED ORDER — GLUCAGON HCL RDNA (DIAGNOSTIC) 1 MG IJ SOLR
INTRAMUSCULAR | Status: AC | PRN
  Administered 2018-11-21: 1 mg via INTRAVENOUS

## 2018-11-21 MED ORDER — MIDAZOLAM HCL 2 MG/2ML IJ SOLN
INTRAMUSCULAR | Status: AC | PRN
  Administered 2018-11-21 (×3): 1 mg via INTRAVENOUS
  Administered 2018-11-21 (×2): 0.5 mg via INTRAVENOUS

## 2018-11-21 MED ORDER — LIDOCAINE HCL 1 % IJ SOLN
INTRAMUSCULAR | Status: AC | PRN
  Administered 2018-11-21: 15 mL

## 2018-11-21 MED ORDER — CEFAZOLIN SODIUM-DEXTROSE 1-4 GM/50ML-% IV SOLN
INTRAVENOUS | Status: AC | PRN
  Administered 2018-11-21: 2 g via INTRAVENOUS

## 2018-11-21 NOTE — Procedures (Signed)
Pre procedure Dx: [id number] Post Procedure Dx: Same  Successful fluoroscopic guided insertion of gastrostomy tube.   The gastrostomy tube may be used immediately for medications.   Tube feeds may be initiated in 24 hours as per the primary team.    EBL: Trace  Complications: None immediate  Katherina Right, MD Pager #: [phone]

## 2018-11-21 NOTE — Sedation Documentation (Signed)
Pt has a ML catheter to RUA.  Cath assessed, dressing CDI, +flush, saline locked

## 2018-11-21 NOTE — Sedation Documentation (Signed)
Unable to monitor EtCO2 as patient is trached and on vent, no EtCO2 adapter for trach/vent available

## 2018-11-21 NOTE — Progress Notes (Addendum)
Pulmonary Critical Care Medicine [facility] [place]   PULMONARY CRITICAL CARE SERVICE  PROGRESS NOTE  Date of Service: 11/21/2018  Shannon Lang  VEH:[id number]  DOB: 1945-09-20   DOA: 10/17/2018  Referring Physician: Carron Curie, MD  HPI: Shannon Lang is a 73 y.o. female seen for follow up of Acute on Chronic Respiratory Failure.  Patient remains on aerosol trach collar 40% FiO2.  Rest comfortably with no distress at this time.  Plan is for bronchoscopy today.  Medications: Reviewed on Rounds  Physical Exam:  Vitals: Pulse 70 respirations 16 BP 128/65 O2 sat on percent temp 96.6  Ventilator Settings ATC 40%  . General: Comfortable at this time . Eyes: Grossly normal lids, irises & conjunctiva . ENT: grossly tongue is normal . Neck: no obvious mass . Cardiovascular: S1 S2 normal no gallop . Respiratory: No rales or rhonchi noted . Abdomen: soft . Skin: no rash seen on limited exam . Musculoskeletal: not rigid . Psychiatric:unable to assess . Neurologic: no seizure no involuntary movements         Lab Data:   Basic Metabolic Panel: Recent Labs  Lab 11/16/18 0616 11/20/18 0728  NA 138 140  K 3.2* 3.8  CL 90* 95*  CO2 33* 33*  GLUCOSE 139* 168*  BUN 47* 53*  CREATININE 1.46* 1.47*  CALCIUM 9.1 9.8    ABG: No results for input(s): PHART, PCO2ART, PO2ART, HCO3, O2SAT in the last 168 hours.  Liver Function Tests: Recent Labs  Lab 11/16/18 0616  AST 29  ALT 17  ALKPHOS 150*  BILITOT 0.7  PROT 7.1  ALBUMIN 2.3*   No results for input(s): LIPASE, AMYLASE in the last 168 hours. Recent Labs  Lab 11/17/18 1435  AMMONIA 23    CBC: Recent Labs  Lab 11/16/18 0616 11/20/18 0728 11/21/18 0657  WBC 12.1* 14.1* 10.3  HGB 9.7* 10.2* 10.6*  HCT 31.5* 33.1* 35.2*  MCV 89.7 90.9 90.7  PLT 435* 343 349    Cardiac Enzymes: No results for input(s): CKTOTAL, CKMB, CKMBINDEX, TROPONINI in the last 168 hours.  BNP (last 3 results) No results  for input(s): BNP in the last 8760 hours.  ProBNP (last 3 results) No results for input(s): PROBNP in the last 8760 hours.  Radiological Exams: Ct Abdomen Wo Contrast  Result Date: 11/20/2018 CLINICAL DATA:  Evaluate anatomy prior to potential percutaneous gastrostomy tube placement. EXAM: CT ABDOMEN WITHOUT CONTRAST TECHNIQUE: Multidetector CT imaging of the abdomen was performed following the standard protocol without IV contrast. COMPARISON:  None. FINDINGS: Lower chest: Limited visualization of the lower thorax demonstrates bibasilar subsegmental atelectasis. No definite pleural effusion. Cardiomegaly.  No pericardial effusion. Hepatobiliary: Mild nodularity of the hepatic contour. Post cholecystectomy. No ascites. Pancreas: The pancreas appears atrophic. Spleen: Normal noncontrast appearance of the spleen. Adrenals/Urinary Tract: Suspected mild thinning of the bilateral renal cortices. No renal stones. There is a minimal amount of grossly symmetric bilateral perinephric stranding. No urinary obstruction. Normal noncontrast appearance the bilateral adrenal glands. Only the dome of the urinary bladder is imaged. Stomach/Bowel: Enteric tube terminates within the mid body of the stomach. The anterior wall the stomach is well apposed against the ventral wall of the upper abdomen without interposed liver or transverse colon. Suspected duodenal diverticulum. No evidence of enteric obstruction. Vascular/Lymphatic: Atherosclerotic plaque within a normal caliber abdominal aorta. No bulky retroperitoneal or mesenteric adenopathy within the imaged abdomen. Other: There is a macrolobulated mixed attenuating fluid collection within the subcutaneous tissues of the midline the lower  chest which measures at least 15.3 x 4.2 cm diameter. Metallic sutures are noted about the ventral aspect the right upper abdominal wall. Musculoskeletal: No acute or aggressive osseous abnormalities. Stigmata of DISH within thoracic spine.  IMPRESSION: 1. Gastric anatomy amenable to potential percutaneous gastrostomy tube placement as indicated. 2. Indeterminate macrolobulated mixed attenuating apparent fluid collection within the subcutaneous tissues of the midline of the lower chest, nonspecific though potentially representative of a hematoma, potentially the sequela of recent chest compressions? Clinical correlation is advised. 3. Cardiomegaly. 4.  Aortic Atherosclerosis (ICD10-I70.0). Electronically Signed   By: Simonne ComeJohn  Watts M.D.   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 Acute on chronic respiratory failure with hypoxia continue weaning per protocol.  Likely will have bronchoscopy today to decrease mucus plugging.  Continue aggressive pulmonary toilet secretion management 2. Aspiration pneumonia treated continue supportive care 3. Atrial fibrillation rate controlled 4. Congestive heart failure at baseline continue present therapy 5. Severe COPD at baseline to monitor   I have personally seen and evaluated the patient, evaluated laboratory and imaging results, formulated the assessment and plan and placed orders. The Patient requires high complexity decision making for assessment and support.  Case was discussed on Rounds with the Respiratory Therapy Staff  Yevonne PaxSaadat A Khan, MD [facility] Pulmonary Critical Care Medicine Sleep Medicine

## 2018-11-22 DIAGNOSIS — J9621 Acute and chronic respiratory failure with hypoxia: Secondary | ICD-10-CM | POA: Diagnosis not present

## 2018-11-22 DIAGNOSIS — J9811 Atelectasis: Secondary | ICD-10-CM | POA: Diagnosis not present

## 2018-11-22 DIAGNOSIS — J690 Pneumonitis due to inhalation of food and vomit: Secondary | ICD-10-CM | POA: Diagnosis not present

## 2018-11-22 DIAGNOSIS — I5032 Chronic diastolic (congestive) heart failure: Secondary | ICD-10-CM | POA: Diagnosis not present

## 2018-11-22 DIAGNOSIS — I4891 Unspecified atrial fibrillation: Secondary | ICD-10-CM | POA: Diagnosis not present

## 2018-11-22 LAB — GRAM STAIN

## 2018-11-22 NOTE — Procedures (Signed)
Date: 11/22/2018,  MRN# [id number]    Procedure Note: Fiberoptic Bronchoscopy   PROCEDURE DATE: 11/22/2018     NAME:  Shannon Lang   DOB:[birth year]   MRN: [id number] LOC:  [id number]      Indications/Preliminary Diagnosis: Atelectasis  Consent: (Place X beside choice/s below)  The benefits, risks and possible complications of the procedure were        explained to:  ___ patient  _X__ patient's family  ___ other:___________  who verbalized understanding and gave:  ___ verbal  ___ written  ___ verbal and written  _X__ telephone  ___ other:________ consent.      Unable to obtain consent; procedure performed on emergent basis.     Other:      PRESEDATION ASSESSMENT: History and Physical has been performed. Patient meds and allergies have been reviewed. Presedation airway examination has been performed and documented. Baseline vital signs, sedation score, oxygenation status, and cardiac rhythm were reviewed. Patient was deemed to be in satisfactory condition to undergo the procedure.  PREMEDICATIONS:   Sedative/Narcotic Amt Dose   Versed 2 mg   Fentanyl 50 mcg  Diprivan  mg         Insertion Route (Place X beside choice below)   Nasal   Oral   Endotracheal Tube  X Tracheostomy   INTRAPROCEDURE MEDICATIONS:  Sedative/Narcotic Amt Dose   Versed  mg   Fentanyl  mcg  Diprivan  mg       Medication Amt Dose  Medication Amt Dose  Xylocaine 2%  cc  Epinephrine 1:10,000 sol  cc  Xylocaine 4%  cc  Cocaine  cc   TECHNICAL PROCEDURES: (Place X beside choice below)   Procedures  Description  X  None     Electrocautery     Cryotherapy     Balloon Dilatation     Bronchography     Stent Placement     Therapeutic Aspiration     Laser/Argon Plasma            SPECIMENS (Sites): (Place X beside choice below)  Specimens Description   No Specimens Obtained     Washings   X Lavage  bronchoalveolar lavage was performed from the right lower lobe and right middle lobe   Biopsies    Fine Needle Aspirates    Brushings    Sputum    FINDINGS:  ESTIMATED BLOOD LOSS: none  COMPLICATIONS/RESOLUTION: none  PROCEDURE DETAILS: Timeout performed and correct patient, name, & ID confirmed. Following prep per Pulmonary policy, appropriate sedation was administered.  Airway exam proceeded with findings, technical procedures, and specimen collection as noted below. At the end of exam the scope was withdrawn without incident. Impression and Plan as noted below.    Procedure Note: After above performed the patient achieved adequate sedation Scope was inserted down through the tracheostomy tube.  Down to the carina the carina was found to be nice and sharp there was some copious secretions noted from the right side.  This was suctioned underlying mucosa was noted to be free of any masses or erythema.  Next the fiberoptic scope was inserted into the right lower lobe bronchoalveolar lavage was performed underlying mucosa was clear we had 60 cc instilled with approximately 40 cc return.  Next the left lung was evaluated again minimal to moderate secretions were noted.  Compared to her previous bronchoscopy there was a significant improvement.  Specimen collected sent for analysis in the lab cultures    IMPRESSION:POST-PROCEDURE DX: Increased tracheal secretions  leading to atelectasis   RECOMMENDATION/PLAN: Aggressive pulmonary toilet chest PT as indicated  I have personally performed the procedure as noted above     Allyne Gee, MD [facility] Pulmonary Critical Care Medicine

## 2018-11-22 NOTE — Progress Notes (Addendum)
Pulmonary Critical Care Medicine [facility] [place]   PULMONARY CRITICAL CARE SERVICE  PROGRESS NOTE  Date of Service: 11/22/2018  Shannon Lang  ZDG:[id number]  DOB: 11/08/1945   DOA: 10/17/2018  Referring Physician: Carron CurieAli Hijazi, MD  HPI: Shannon Lang is a 73 y.o. female seen for follow up of Acute on Chronic Respiratory Failure.  Patient has a goal today 20 hours on aerosol trach collar 35% FiO2.  Currently satting in the mid 90s with no distress.  Medications: Reviewed on Rounds  Physical Exam:  Vitals: Pulse 71 respirations 24 BP 160/81 O2 sat 96% temp 97.0  Ventilator Settings ATC 35%  . General: Comfortable at this time . Eyes: Grossly normal lids, irises & conjunctiva . ENT: grossly tongue is normal . Neck: no obvious mass . Cardiovascular: S1 S2 normal no gallop . Respiratory: No rales or rhonchi noted . Abdomen: soft . Skin: no rash seen on limited exam . Musculoskeletal: not rigid . Psychiatric:unable to assess . Neurologic: no seizure no involuntary movements         Lab Data:   Basic Metabolic Panel: Recent Labs  Lab 11/16/18 0616 11/20/18 0728  NA 138 140  K 3.2* 3.8  CL 90* 95*  CO2 33* 33*  GLUCOSE 139* 168*  BUN 47* 53*  CREATININE 1.46* 1.47*  CALCIUM 9.1 9.8    ABG: No results for input(s): PHART, PCO2ART, PO2ART, HCO3, O2SAT in the last 168 hours.  Liver Function Tests: Recent Labs  Lab 11/16/18 0616  AST 29  ALT 17  ALKPHOS 150*  BILITOT 0.7  PROT 7.1  ALBUMIN 2.3*   No results for input(s): LIPASE, AMYLASE in the last 168 hours. Recent Labs  Lab 11/17/18 1435  AMMONIA 23    CBC: Recent Labs  Lab 11/16/18 0616 11/20/18 0728 11/21/18 0657  WBC 12.1* 14.1* 10.3  HGB 9.7* 10.2* 10.6*  HCT 31.5* 33.1* 35.2*  MCV 89.7 90.9 90.7  PLT 435* 343 349    Cardiac Enzymes: No results for input(s): CKTOTAL, CKMB, CKMBINDEX, TROPONINI in the last 168 hours.  BNP (last 3 results) No results for input(s): BNP  in the last 8760 hours.  ProBNP (last 3 results) No results for input(s): PROBNP in the last 8760 hours.  Radiological Exams: Ir Gastrostomy Tube Mod Sed  Result Date: 11/21/2018 INDICATION: Dysphagia. Please place percutaneous gastrostomy tube for enteric nutrition supplementation purposes. EXAM: PUSH GASTROSTOMY TUBE PLACEMENT COMPARISON:  CT abdomen pelvis-11/20/2018 MEDICATIONS: Ancef 2 gm IV; Antibiotics were administered within 1 hour of the procedure. CONTRAST:  20 mL of Isovue 300 administered into the gastric lumen. ANESTHESIA/SEDATION: Moderate (conscious) sedation was employed during this procedure. A total of Versed 4 mg and Fentanyl 200 mcg was administered intravenously. Moderate Sedation Time: 78 minutes. The patient's level of consciousness and vital signs were monitored continuously by radiology nursing throughout the procedure under my direct supervision. FLUOROSCOPY TIME:  10 minutes, 6 seconds (85.1 mGy) COMPLICATIONS: None immediate. PROCEDURE: Informed written consent was obtained from the patient's family following explanation of the procedure, risks, benefits and alternatives. A time out was performed prior to the initiation of the procedure. Ultrasound scanning was performed to demarcate the edge of the left lobe of the liver. Attempts were made to place an orogastric tube however this proved difficult secondary to suspected stenosis/stricture in the setting of chronic nasogastric tube. As such the decision was made to place a push gastrostomy utilizing the existing nasogastric tube for gastric insufflation purposes. Maximal barrier sterile technique utilized  including caps, mask, sterile gowns, sterile gloves, large sterile drape, hand hygiene and Betadine prep. The left upper quadrant was sterilely prepped and draped. The left costal margin and air opacified transverse colon were identified and avoided. Air was injected into the stomach for insufflation and visualization under  fluoroscopy. Under sterile conditions and local anesthesia, 3 T tacks were utilized to pexy the anterior aspect of the stomach against the ventral abdominal wall. Contrast injection confirmed appropriate positioning of each of the T tacks. An incision was made between the T tacks and a 17 gauge trocar needle was utilized to access the stomach. Needle position was confirmed within the stomach with aspiration of air and injection of a small amount of contrast. Short Amplatz wire was advanced into the gastric lumen and under intermittent fluoroscopic guidance, Under intermittent fluoroscopic guidance, track was serially dilated ultimately allowing placement a peel-away sheath. Through the peel-away sheath, a 18-French balloon retention gastrostomy tube. The retention balloon was insufflated with a mixture of dilute saline and contrast and pulled taut against the anterior wall of the stomach. The external disc was cinched. Contrast injection confirms positioning within the stomach. Several spot radiographic images were obtained in various obliquities for documentation. The patient tolerated procedure well without immediate post procedural complication. FINDINGS: After successful fluoroscopic guided placement, the gastrostomy tube is appropriately positioned with internal retention balloon against the ventral aspect of the gastric lumen. IMPRESSION: Successful fluoroscopic insertion of an 58 French balloon retention gastrostomy tube. The gastrostomy may be used immediately for medication administration and in 24 hrs for the initiation of feeds. Electronically Signed   By: Simonne Come M.D.   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 Acute  on chronic respiratory failure with hypoxia continue weaning per protocol.  Continue aggressive pulmonary toilet secretion management at this time 2. Aspiration pneumonia treated continue supportive care 3. Atrial fibrillation rate controlled 4. Congestive heart failure baseline continue current therapy 5. Severe COPD at baseline continue to monitor   I have personally seen and evaluated the patient, evaluated laboratory and imaging results, formulated the assessment and plan and placed orders. The Patient requires high complexity decision making for assessment and support.  Case was discussed on Rounds with the Respiratory Therapy Staff  Yevonne Pax, MD [facility] Pulmonary Critical Care Medicine Sleep Medicine

## 2018-11-22 NOTE — Progress Notes (Signed)
PA called to unit for assessment of gastrostomy tube after placement yesterday.  Per RN, site is clean and dry.  No evidence of bleeding.  No abdominal pain.  Ok to use 24 hrs post-procedure.  Loyce Dys, MS RD PA-C

## 2018-11-23 DIAGNOSIS — J9621 Acute and chronic respiratory failure with hypoxia: Secondary | ICD-10-CM | POA: Diagnosis not present

## 2018-11-23 DIAGNOSIS — I4891 Unspecified atrial fibrillation: Secondary | ICD-10-CM | POA: Diagnosis not present

## 2018-11-23 DIAGNOSIS — I5032 Chronic diastolic (congestive) heart failure: Secondary | ICD-10-CM | POA: Diagnosis not present

## 2018-11-23 DIAGNOSIS — J690 Pneumonitis due to inhalation of food and vomit: Secondary | ICD-10-CM | POA: Diagnosis not present

## 2018-11-23 LAB — BASIC METABOLIC PANEL
Anion gap: 13 (ref 5–15)
BUN: 46 mg/dL — ABNORMAL HIGH (ref 8–23)
CO2: 32 mmol/L (ref 22–32)
Calcium: 9.7 mg/dL (ref 8.9–10.3)
Chloride: 96 mmol/L — ABNORMAL LOW (ref 98–111)
Creatinine, Ser: 1.38 mg/dL — ABNORMAL HIGH (ref 0.44–1.00)
GFR calc Af Amer: 45 mL/min — ABNORMAL LOW (ref >60)
GFR calc non Af Amer: 39 mL/min — ABNORMAL LOW (ref >60)
Glucose, Bld: 158 mg/dL — ABNORMAL HIGH (ref 70–99)
Potassium: 3.2 mmol/L — ABNORMAL LOW (ref 3.5–5.1)
Sodium: 141 mmol/L (ref 135–145)

## 2018-11-23 LAB — CBC
HCT: 34.4 % — ABNORMAL LOW (ref 36.0–46.0)
Hemoglobin: 10.8 g/dL — ABNORMAL LOW (ref 12.0–15.0)
MCH: 27.6 pg (ref 26.0–34.0)
MCHC: 31.4 g/dL (ref 30.0–36.0)
MCV: 88 fL (ref 80.0–100.0)
Platelets: 315 10*3/uL (ref 150–400)
RBC: 3.91 MIL/uL (ref 3.87–5.11)
RDW: 16 % — ABNORMAL HIGH (ref 11.5–15.5)
WBC: 13 10*3/uL — ABNORMAL HIGH (ref 4.0–10.5)
nRBC: 0 % (ref 0.0–0.2)

## 2018-11-23 NOTE — Progress Notes (Addendum)
[facility] [place]   PULMONARY CRITICAL CARE SERVICE  PROGRESS NOTE  Date of Service: 11/23/2018  Shannon NianSheila Lang  ZOX:[id number]  DOB: 10/04/1945   DOA: 10/17/2018  Referring Physician: Carron CurieAli Hijazi, MD  HPI: Shannon NianSheila Lang is a 73 y.o. female seen for follow up of Acute on Chronic Respiratory Failure.  Patient is a 20-hour goal today on aerosol trach collar 40% FiO2.  Currently doing well satting in the upper 90s with no distress.  Medications: Reviewed on Rounds  Physical Exam:  Vitals: Pulse 85 respirations 30 BP 128/68 O2 sat 99% temp 101.0  Ventilator Settings currently ATC 40% for 20 hours then rest on AC PC rate of 18 expiratory pressure 20 PEEP of 5 FiO2 35%  . General: Comfortable at this time . Eyes: Grossly normal lids, irises & conjunctiva . ENT: grossly tongue is normal . Neck: no obvious mass . Cardiovascular: S1 S2 normal no gallop . Respiratory: No rales or rhonchi noted . Abdomen: soft . Skin: no rash seen on limited exam . Musculoskeletal: not rigid . Psychiatric:unable to assess . Neurologic: no seizure no involuntary movements         Lab Data:   Basic Metabolic Panel: Recent Labs  Lab 11/20/18 0728 11/23/18 0752  NA 140 141  K 3.8 3.2*  CL 95* 96*  CO2 33* 32  GLUCOSE 168* 158*  BUN 53* 46*  CREATININE 1.47* 1.38*  CALCIUM 9.8 9.7    ABG: No results for input(s): PHART, PCO2ART, PO2ART, HCO3, O2SAT in the last 168 hours.  Liver Function Tests: No results for input(s): AST, ALT, ALKPHOS, BILITOT, PROT, ALBUMIN in the last 168 hours. No results for input(s): LIPASE, AMYLASE in the last 168 hours. Recent Labs  Lab 11/17/18 1435  AMMONIA 23    CBC: Recent Labs  Lab 11/20/18 0728 11/21/18 0657 11/23/18 0752  WBC 14.1* 10.3 13.0*  HGB 10.2* 10.6* 10.8*  HCT 33.1* 35.2* 34.4*  MCV 90.9 90.7 88.0  PLT 343 349 315    Cardiac Enzymes: No results for input(s): CKTOTAL, CKMB, CKMBINDEX,  TROPONINI in the last 168 hours.  BNP (last 3 results) No results for input(s): BNP in the last 8760 hours.  ProBNP (last 3 results) No results for input(s): PROBNP in the last 8760 hours.  Radiological Exams: Ir Gastrostomy Tube Mod Sed  Result Date: 11/21/2018 INDICATION: Dysphagia. Please place percutaneous gastrostomy tube for enteric nutrition supplementation purposes. EXAM: PUSH GASTROSTOMY TUBE PLACEMENT COMPARISON:  CT abdomen pelvis-11/20/2018 MEDICATIONS: Ancef 2 gm IV; Antibiotics were administered within 1 hour of the procedure. CONTRAST:  20 mL of Isovue 300 administered into the gastric lumen. ANESTHESIA/SEDATION: Moderate (conscious) sedation was employed during this procedure. A total of Versed 4 mg and Fentanyl 200 mcg was administered intravenously. Moderate Sedation Time: 78 minutes. The patient's level of consciousness and vital signs were monitored continuously by radiology nursing throughout the procedure under my direct supervision. FLUOROSCOPY TIME:  10 minutes, 6 seconds (85.1 mGy) COMPLICATIONS: None immediate. PROCEDURE: Informed written consent was obtained from the patient's family following explanation of the procedure, risks, benefits and alternatives. A time out was performed prior to the initiation of the procedure. Ultrasound scanning was performed to demarcate the edge of the left lobe of the liver. Attempts were made to place an orogastric tube however this proved difficult secondary to suspected stenosis/stricture in the setting of chronic nasogastric tube. As such the decision was made to place a push gastrostomy utilizing the existing nasogastric  tube for gastric insufflation purposes. Maximal barrier sterile technique utilized including caps, mask, sterile gowns, sterile gloves, large sterile drape, hand hygiene and Betadine prep. The left upper quadrant was sterilely prepped and draped. The left costal margin and air opacified transverse colon were identified and  avoided. Air was injected into the stomach for insufflation and visualization under fluoroscopy. Under sterile conditions and local anesthesia, 3 T tacks were utilized to pexy the anterior aspect of the stomach against the ventral abdominal wall. Contrast injection confirmed appropriate positioning of each of the T tacks. An incision was made between the T tacks and a 17 gauge trocar needle was utilized to access the stomach. Needle position was confirmed within the stomach with aspiration of air and injection of a small amount of contrast. Short Amplatz wire was advanced into the gastric lumen and under intermittent fluoroscopic guidance, Under intermittent fluoroscopic guidance, track was serially dilated ultimately allowing placement a peel-away sheath. Through the peel-away sheath, a 18-French balloon retention gastrostomy tube. The retention balloon was insufflated with a mixture of dilute saline and contrast and pulled taut against the anterior wall of the stomach. The external disc was cinched. Contrast injection confirms positioning within the stomach. Several spot radiographic images were obtained in various obliquities for documentation. The patient tolerated procedure well without immediate post procedural complication. FINDINGS: After successful fluoroscopic guided placement, the gastrostomy tube is appropriately positioned with internal retention balloon against the ventral aspect of the gastric lumen. IMPRESSION: Successful fluoroscopic insertion of an 35 French balloon retention gastrostomy tube. The gastrostomy may be used immediately for medication administration and in 24 hrs for the initiation of feeds. Electronically Signed   By: Simonne Come M.D.   On: 11/21/2018 17:27    Assessment/Plan Active Problems:   Acute on chronic respiratory failure with hypoxia (HCC)   Aspiration pneumonia due to gastric secretions (HCC)   Atrial fibrillation with RVR (HCC)   Chronic congestive heart failure  with left ventricular diastolic dysfunction (HCC)   COPD, severe (HCC)   1. Acute on chronic respiratory failure with hypoxia continue wean per protocol.  Goal of 20 hours today on aerosol trach collar.  Continue aggressive pulmonary toilet secretion management 2. Aspiration pneumonia treated continue supportive care 3. Atrial fibrillation rate controlled 4. Congestive heart failure at baseline continue current therapy 5. Severe COPD at baseline continue to monitor   I have personally seen and evaluated the patient, evaluated laboratory and imaging results, formulated the assessment and plan and placed orders. The Patient requires high complexity decision making for assessment and support.  Case was discussed on Rounds with the Respiratory Therapy Staff  Yevonne Pax, MD [facility] Pulmonary Critical Care Medicine Sleep Medicine

## 2018-11-24 DIAGNOSIS — J690 Pneumonitis due to inhalation of food and vomit: Secondary | ICD-10-CM | POA: Diagnosis not present

## 2018-11-24 DIAGNOSIS — I5032 Chronic diastolic (congestive) heart failure: Secondary | ICD-10-CM | POA: Diagnosis not present

## 2018-11-24 DIAGNOSIS — J9621 Acute and chronic respiratory failure with hypoxia: Secondary | ICD-10-CM | POA: Diagnosis not present

## 2018-11-24 DIAGNOSIS — I4891 Unspecified atrial fibrillation: Secondary | ICD-10-CM | POA: Diagnosis not present

## 2018-11-24 LAB — ACID FAST SMEAR (AFB, MYCOBACTERIA): Acid Fast Smear: NEGATIVE

## 2018-11-24 NOTE — Progress Notes (Addendum)
Pulmonary Critical Care Medicine [facility] [place]   PULMONARY CRITICAL CARE SERVICE  PROGRESS NOTE  Date of Service: 11/24/2018  Kagan Rabelo  JQG:[id number]  DOB: [birth year]   DOA: 10/17/2018  Referring Physician: Carron Curie, MD  HPI: Shannon Lang is a 73 y.o. female seen for follow up of Acute on Chronic Respiratory Failure.  Patient was able to tolerate 1 hour of trach collar yesterday today is doing much better and will continue with a 20-hour goal of 40% FiO2.  Medications: Reviewed on Rounds  Physical Exam:  Vitals: Pulse 81 respirations 35 BP 149/62 O2 sat 98% temp 98.9  Ventilator Settings ATC 40%  . General: Comfortable at this time . Eyes: Grossly normal lids, irises & conjunctiva . ENT: grossly tongue is normal . Neck: no obvious mass . Cardiovascular: S1 S2 normal no gallop . Respiratory: No rales or rhonchi noted . Abdomen: soft . Skin: no rash seen on limited exam . Musculoskeletal: not rigid . Psychiatric:unable to assess . Neurologic: no seizure no involuntary movements         Lab Data:   Basic Metabolic Panel: Recent Labs  Lab 11/20/18 0728 11/23/18 0752  NA 140 141  K 3.8 3.2*  CL 95* 96*  CO2 33* 32  GLUCOSE 168* 158*  BUN 53* 46*  CREATININE 1.47* 1.38*  CALCIUM 9.8 9.7    ABG: No results for input(s): PHART, PCO2ART, PO2ART, HCO3, O2SAT in the last 168 hours.  Liver Function Tests: No results for input(s): AST, ALT, ALKPHOS, BILITOT, PROT, ALBUMIN in the last 168 hours. No results for input(s): LIPASE, AMYLASE in the last 168 hours. No results for input(s): AMMONIA in the last 168 hours.  CBC: Recent Labs  Lab 11/20/18 0728 11/21/18 0657 11/23/18 0752  WBC 14.1* 10.3 13.0*  HGB 10.2* 10.6* 10.8*  HCT 33.1* 35.2* 34.4*  MCV 90.9 90.7 88.0  PLT 343 349 315    Cardiac Enzymes: No results for input(s): CKTOTAL, CKMB, CKMBINDEX, TROPONINI in the last 168 hours.  BNP (last 3 results) No results for  input(s): BNP in the last 8760 hours.  ProBNP (last 3 results) No results for input(s): PROBNP in the last 8760 hours.  Radiological Exams: No results found.  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 Acute on chronic respiratory failure with hypoxia continue to wean per protocol.  Goal today is 20 hours on aerosol trach collar 40% FiO2.  Continue secretion management pulmonary toilet 2. Aspiration pneumonia treated continue supportive care 3. Atrial fibrillation rate controlled 4. Congestive heart failure at baseline continue current therapy 5. Severe COPD at baseline continue to monitor   I have personally seen and evaluated the patient, evaluated laboratory and imaging results, formulated the assessment and plan and placed orders. The Patient requires high complexity decision making for assessment and support.  Case was discussed on Rounds with the Respiratory Therapy Staff  Yevonne Pax, MD [facility] Pulmonary Critical Care Medicine Sleep Medicine

## 2018-11-25 DIAGNOSIS — I4891 Unspecified atrial fibrillation: Secondary | ICD-10-CM | POA: Diagnosis not present

## 2018-11-25 DIAGNOSIS — J690 Pneumonitis due to inhalation of food and vomit: Secondary | ICD-10-CM | POA: Diagnosis not present

## 2018-11-25 DIAGNOSIS — J9621 Acute and chronic respiratory failure with hypoxia: Secondary | ICD-10-CM | POA: Diagnosis not present

## 2018-11-25 DIAGNOSIS — I5032 Chronic diastolic (congestive) heart failure: Secondary | ICD-10-CM | POA: Diagnosis not present

## 2018-11-25 NOTE — Progress Notes (Addendum)
Pulmonary Critical Care Medicine [facility] [place]   PULMONARY CRITICAL CARE SERVICE  PROGRESS NOTE  Date of Service: 11/25/2018  Shannon Lang  [id number]  DOB: 12-28-1945   DOA: 10/17/2018  Referring Physician: Carron Curie, MD  HPI: Shannon Lang is a 73 y.o. female seen for follow up of Acute on Chronic Respiratory Failure.  Patient is completed 24 hours on aerosol trach collar 40% FiO2 and is going for 48-hour goal at this time.  Currently satting well with no distress.  Medications: Reviewed on Rounds  Physical Exam:  Vitals: Pulse 84 respirations 20 BP 123/59 O2 sat 93% temp 99.2  Ventilator Settings aerosol trach collar 40% FiO2  . General: Comfortable at this time . Eyes: Grossly normal lids, irises & conjunctiva . ENT: grossly tongue is normal . Neck: no obvious mass . Cardiovascular: S1 S2 normal no gallop . Respiratory: No rales or rhonchi noted . Abdomen: soft . Skin: no rash seen on limited exam . Musculoskeletal: not rigid . Psychiatric:unable to assess . Neurologic: no seizure no involuntary movements         Lab Data:   Basic Metabolic Panel: Recent Labs  Lab 11/20/18 0728 11/23/18 0752  NA 140 141  K 3.8 3.2*  CL 95* 96*  CO2 33* 32  GLUCOSE 168* 158*  BUN 53* 46*  CREATININE 1.47* 1.38*  CALCIUM 9.8 9.7    ABG: No results for input(s): PHART, PCO2ART, PO2ART, HCO3, O2SAT in the last 168 hours.  Liver Function Tests: No results for input(s): AST, ALT, ALKPHOS, BILITOT, PROT, ALBUMIN in the last 168 hours. No results for input(s): LIPASE, AMYLASE in the last 168 hours. No results for input(s): AMMONIA in the last 168 hours.  CBC: Recent Labs  Lab 11/20/18 0728 11/21/18 0657 11/23/18 0752  WBC 14.1* 10.3 13.0*  HGB 10.2* 10.6* 10.8*  HCT 33.1* 35.2* 34.4*  MCV 90.9 90.7 88.0  PLT 343 349 315    Cardiac Enzymes: No results for input(s): CKTOTAL, CKMB, CKMBINDEX, TROPONINI in the last 168 hours.  BNP (last 3  results) No results for input(s): BNP in the last 8760 hours.  ProBNP (last 3 results) No results for input(s): PROBNP in the last 8760 hours.  Radiological Exams: No results found.  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 Acute on chronic respiratory failure with hypoxia continue to wean per protocol goal is currently 48 hours on aerosol trach collar.  Continue secretion management and pulmonary toilet 2. Aspiration pneumonia treated continue supportive care 3. Atrial fibrillation rate controlled 4. Congestive heart failure baseline continue current therapy 5. Severe COPD at baseline continue to monitor   I have personally seen and evaluated the patient, evaluated laboratory and imaging results, formulated the assessment and plan and placed orders. The Patient requires high complexity decision making for assessment and support.  Case was discussed on Rounds with the Respiratory Therapy Staff  Yevonne Pax, MD [facility] Pulmonary Critical Care Medicine Sleep Medicine

## 2018-11-26 DIAGNOSIS — J9621 Acute and chronic respiratory failure with hypoxia: Secondary | ICD-10-CM | POA: Diagnosis not present

## 2018-11-26 DIAGNOSIS — J690 Pneumonitis due to inhalation of food and vomit: Secondary | ICD-10-CM | POA: Diagnosis not present

## 2018-11-26 DIAGNOSIS — I4891 Unspecified atrial fibrillation: Secondary | ICD-10-CM | POA: Diagnosis not present

## 2018-11-26 DIAGNOSIS — I5032 Chronic diastolic (congestive) heart failure: Secondary | ICD-10-CM | POA: Diagnosis not present

## 2018-11-26 LAB — BLOOD GAS, ARTERIAL
Acid-Base Excess: 13.3 mmol/L — ABNORMAL HIGH (ref 0.0–2.0)
Bicarbonate: 38.7 mmol/L — ABNORMAL HIGH (ref 20.0–28.0)
FIO2: 40
O2 Saturation: 95.6 %
Patient temperature: 98.6
pCO2 arterial: 62.4 mmHg — ABNORMAL HIGH (ref 32.0–48.0)
pH, Arterial: 7.409 (ref 7.350–7.450)
pO2, Arterial: 80.1 mmHg — ABNORMAL LOW (ref 83.0–108.0)

## 2018-11-26 LAB — BASIC METABOLIC PANEL
Anion gap: 12 (ref 5–15)
BUN: 58 mg/dL — ABNORMAL HIGH (ref 8–23)
CO2: 35 mmol/L — ABNORMAL HIGH (ref 22–32)
Calcium: 9.9 mg/dL (ref 8.9–10.3)
Chloride: 95 mmol/L — ABNORMAL LOW (ref 98–111)
Creatinine, Ser: 1.26 mg/dL — ABNORMAL HIGH (ref 0.44–1.00)
GFR calc Af Amer: 50 mL/min — ABNORMAL LOW (ref >60)
GFR calc non Af Amer: 43 mL/min — ABNORMAL LOW (ref >60)
Glucose, Bld: 168 mg/dL — ABNORMAL HIGH (ref 70–99)
Potassium: 3.7 mmol/L (ref 3.5–5.1)
Sodium: 142 mmol/L (ref 135–145)

## 2018-11-26 NOTE — Progress Notes (Addendum)
Pulmonary Critical Care Medicine [facility] [place]   PULMONARY CRITICAL CARE SERVICE  PROGRESS NOTE  Date of Service: 11/26/2018  Shannon Lang  [id number]  DOB: 05-26-1946   DOA: 10/17/2018  Referring Physician: Carron Curie, MD  HPI: Shannon Lang is a 73 y.o. female seen for follow up of Acute on Chronic Respiratory Failure.  Patient has now been 48 hours on aerosol trach collar 40% FiO2.  Resting comfortably with no distress at this time.  Medications: Reviewed on Rounds  Physical Exam:  Vitals: Pulse 90 respirations 18 BP 153/73 O2 sat 95% temp 98.4  Ventilator Settings 40% ATC  . General: Comfortable at this time . Eyes: Grossly normal lids, irises & conjunctiva . ENT: grossly tongue is normal . Neck: no obvious mass . Cardiovascular: S1 S2 normal no gallop . Respiratory: No rales or rhonchi noted . Abdomen: soft . Skin: no rash seen on limited exam . Musculoskeletal: not rigid . Psychiatric:unable to assess . Neurologic: no seizure no involuntary movements         Lab Data:   Basic Metabolic Panel: Recent Labs  Lab 11/20/18 0728 11/23/18 0752 11/26/18 0612  NA 140 141 142  K 3.8 3.2* 3.7  CL 95* 96* 95*  CO2 33* 32 35*  GLUCOSE 168* 158* 168*  BUN 53* 46* 58*  CREATININE 1.47* 1.38* 1.26*  CALCIUM 9.8 9.7 9.9    ABG: Recent Labs  Lab 11/26/18 1030  PHART 7.409  PCO2ART 62.4*  PO2ART 80.1*  HCO3 38.7*  O2SAT 95.6    Liver Function Tests: No results for input(s): AST, ALT, ALKPHOS, BILITOT, PROT, ALBUMIN in the last 168 hours. No results for input(s): LIPASE, AMYLASE in the last 168 hours. No results for input(s): AMMONIA in the last 168 hours.  CBC: Recent Labs  Lab 11/20/18 0728 11/21/18 0657 11/23/18 0752  WBC 14.1* 10.3 13.0*  HGB 10.2* 10.6* 10.8*  HCT 33.1* 35.2* 34.4*  MCV 90.9 90.7 88.0  PLT 343 349 315    Cardiac Enzymes: No results for input(s): CKTOTAL, CKMB, CKMBINDEX, TROPONINI in the last 168  hours.  BNP (last 3 results) No results for input(s): BNP in the last 8760 hours.  ProBNP (last 3 results) No results for input(s): PROBNP in the last 8760 hours.  Radiological Exams: No results found.  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 Acute on chronic respiratory failure with hypoxia continue to wean per protocol.  Patient has been 48 hours at this point.  Continue pulmonary toilet and secretion management 2. Aspiration pneumonia treated continue supportive care 3. Atrial fibrillation rate controlled 4. Congestive heart failure at baseline continue current therapy 5. Severe COPD at baseline continue to monitor   I have personally seen and evaluated the patient, evaluated laboratory and imaging results, formulated the assessment and plan and placed orders. The Patient requires high complexity decision making for assessment and support.  Case was discussed on Rounds with the Respiratory Therapy Staff  Yevonne Pax, MD [facility] Pulmonary Critical Care Medicine Sleep Medicine

## 2018-11-27 DIAGNOSIS — J9621 Acute and chronic respiratory failure with hypoxia: Secondary | ICD-10-CM | POA: Diagnosis not present

## 2018-11-27 DIAGNOSIS — I5032 Chronic diastolic (congestive) heart failure: Secondary | ICD-10-CM | POA: Diagnosis not present

## 2018-11-27 DIAGNOSIS — I4891 Unspecified atrial fibrillation: Secondary | ICD-10-CM | POA: Diagnosis not present

## 2018-11-27 DIAGNOSIS — J690 Pneumonitis due to inhalation of food and vomit: Secondary | ICD-10-CM | POA: Diagnosis not present

## 2018-11-27 LAB — NOVEL CORONAVIRUS, NAA (HOSP ORDER, SEND-OUT TO REF LAB; TAT 18-24 HRS): SARS-CoV-2, NAA: NOT DETECTED

## 2018-11-27 NOTE — Progress Notes (Addendum)
Pulmonary Critical Care Medicine [facility] [place]   PULMONARY CRITICAL CARE SERVICE  PROGRESS NOTE  Date of Service: 11/27/2018  Shannon Lang  [id number]  DOB: [birth year]   DOA: 10/17/2018  Referring Physician: Carron Curie, MD  HPI: Shannon Lang is a 73 y.o. female seen for follow up of Acute on Chronic Respiratory Failure.  Patient has been 72 hours on aerosol trach collar 40% FiO2.  Doing well at this time with no distress.  Medications: Reviewed on Rounds  Physical Exam:  Vitals: Pulse 81 respirations 19 BP 124/69 O2 sat 96% temp 98.7  Ventilator Settings 40% ATC  . General: Comfortable at this time . Eyes: Grossly normal lids, irises & conjunctiva . ENT: grossly tongue is normal . Neck: no obvious mass . Cardiovascular: S1 S2 normal no gallop . Respiratory: No rales or rhonchi noted . Abdomen: soft . Skin: no rash seen on limited exam . Musculoskeletal: not rigid . Psychiatric:unable to assess . Neurologic: no seizure no involuntary movements         Lab Data:   Basic Metabolic Panel: Recent Labs  Lab 11/23/18 0752 11/26/18 0612  NA 141 142  K 3.2* 3.7  CL 96* 95*  CO2 32 35*  GLUCOSE 158* 168*  BUN 46* 58*  CREATININE 1.38* 1.26*  CALCIUM 9.7 9.9    ABG: Recent Labs  Lab 11/26/18 1030  PHART 7.409  PCO2ART 62.4*  PO2ART 80.1*  HCO3 38.7*  O2SAT 95.6    Liver Function Tests: No results for input(s): AST, ALT, ALKPHOS, BILITOT, PROT, ALBUMIN in the last 168 hours. No results for input(s): LIPASE, AMYLASE in the last 168 hours. No results for input(s): AMMONIA in the last 168 hours.  CBC: Recent Labs  Lab 11/21/18 0657 11/23/18 0752  WBC 10.3 13.0*  HGB 10.6* 10.8*  HCT 35.2* 34.4*  MCV 90.7 88.0  PLT 349 315    Cardiac Enzymes: No results for input(s): CKTOTAL, CKMB, CKMBINDEX, TROPONINI in the last 168 hours.  BNP (last 3 results) No results for input(s): BNP in the last 8760 hours.  ProBNP (last 3  results) No results for input(s): PROBNP in the last 8760 hours.  Radiological Exams: No results found.  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 Acute on chronic respiratory failure with hypoxia continue weaning per protocol.  72 hours on aerosol trach collar at this time.  Continue to wean as tolerated.  Continue aggressive pulmonary toilet secretion management 2. Aspiration pneumonia treated continue supportive care 3. Atrial fibrillation rate controlled 4. Congestive heart failure at baseline continue current therapy 5. Severe COPD at baseline continue to monitor   I have personally seen and evaluated the patient, evaluated laboratory and imaging results, formulated the assessment and plan and placed orders. The Patient requires high complexity decision making for assessment and support.  Case was discussed on Rounds with the Respiratory Therapy Staff  Yevonne Pax, MD [facility] Pulmonary Critical Care Medicine Sleep Medicine

## 2018-12-16 LAB — CULTURE, FUNGUS WITHOUT SMEAR

## 2018-12-18 ENCOUNTER — Inpatient Hospital Stay (HOSPITAL_COMMUNITY)
Admission: EM | Admit: 2018-12-18 | Discharge: 2019-01-07 | DRG: 682 | Disposition: E | Payer: Medicare Other | Source: Skilled Nursing Facility | Attending: Internal Medicine | Admitting: Internal Medicine

## 2018-12-18 ENCOUNTER — Emergency Department (HOSPITAL_COMMUNITY): Payer: Medicare Other

## 2018-12-18 ENCOUNTER — Encounter (HOSPITAL_COMMUNITY): Payer: Self-pay | Admitting: Emergency Medicine

## 2018-12-18 ENCOUNTER — Inpatient Hospital Stay (HOSPITAL_COMMUNITY): Payer: Medicare Other

## 2018-12-18 ENCOUNTER — Other Ambulatory Visit: Payer: Self-pay

## 2018-12-18 DIAGNOSIS — J440 Chronic obstructive pulmonary disease with acute lower respiratory infection: Secondary | ICD-10-CM | POA: Diagnosis present

## 2018-12-18 DIAGNOSIS — E46 Unspecified protein-calorie malnutrition: Secondary | ICD-10-CM | POA: Diagnosis present

## 2018-12-18 DIAGNOSIS — E119 Type 2 diabetes mellitus without complications: Secondary | ICD-10-CM

## 2018-12-18 DIAGNOSIS — F0390 Unspecified dementia without behavioral disturbance: Secondary | ICD-10-CM | POA: Diagnosis present

## 2018-12-18 DIAGNOSIS — J9690 Respiratory failure, unspecified, unspecified whether with hypoxia or hypercapnia: Secondary | ICD-10-CM

## 2018-12-18 DIAGNOSIS — Z79890 Hormone replacement therapy (postmenopausal): Secondary | ICD-10-CM

## 2018-12-18 DIAGNOSIS — R6889 Other general symptoms and signs: Secondary | ICD-10-CM

## 2018-12-18 DIAGNOSIS — J189 Pneumonia, unspecified organism: Secondary | ICD-10-CM | POA: Diagnosis present

## 2018-12-18 DIAGNOSIS — I472 Ventricular tachycardia: Secondary | ICD-10-CM | POA: Diagnosis not present

## 2018-12-18 DIAGNOSIS — Z66 Do not resuscitate: Secondary | ICD-10-CM | POA: Diagnosis not present

## 2018-12-18 DIAGNOSIS — J9621 Acute and chronic respiratory failure with hypoxia: Secondary | ICD-10-CM | POA: Diagnosis present

## 2018-12-18 DIAGNOSIS — N179 Acute kidney failure, unspecified: Principal | ICD-10-CM | POA: Diagnosis present

## 2018-12-18 DIAGNOSIS — R509 Fever, unspecified: Secondary | ICD-10-CM | POA: Diagnosis not present

## 2018-12-18 DIAGNOSIS — N939 Abnormal uterine and vaginal bleeding, unspecified: Secondary | ICD-10-CM

## 2018-12-18 DIAGNOSIS — I959 Hypotension, unspecified: Secondary | ICD-10-CM | POA: Diagnosis present

## 2018-12-18 DIAGNOSIS — I480 Paroxysmal atrial fibrillation: Secondary | ICD-10-CM | POA: Diagnosis present

## 2018-12-18 DIAGNOSIS — Z79899 Other long term (current) drug therapy: Secondary | ICD-10-CM | POA: Diagnosis not present

## 2018-12-18 DIAGNOSIS — E875 Hyperkalemia: Secondary | ICD-10-CM | POA: Diagnosis present

## 2018-12-18 DIAGNOSIS — I482 Chronic atrial fibrillation, unspecified: Secondary | ICD-10-CM | POA: Diagnosis present

## 2018-12-18 DIAGNOSIS — Z6839 Body mass index (BMI) 39.0-39.9, adult: Secondary | ICD-10-CM

## 2018-12-18 DIAGNOSIS — D649 Anemia, unspecified: Secondary | ICD-10-CM | POA: Diagnosis present

## 2018-12-18 DIAGNOSIS — E860 Dehydration: Secondary | ICD-10-CM | POA: Diagnosis present

## 2018-12-18 DIAGNOSIS — I5032 Chronic diastolic (congestive) heart failure: Secondary | ICD-10-CM | POA: Diagnosis present

## 2018-12-18 DIAGNOSIS — Z515 Encounter for palliative care: Secondary | ICD-10-CM | POA: Diagnosis not present

## 2018-12-18 DIAGNOSIS — Z930 Tracheostomy status: Secondary | ICD-10-CM

## 2018-12-18 DIAGNOSIS — R680 Hypothermia, not associated with low environmental temperature: Secondary | ICD-10-CM | POA: Diagnosis present

## 2018-12-18 DIAGNOSIS — E870 Hyperosmolality and hypernatremia: Secondary | ICD-10-CM | POA: Diagnosis present

## 2018-12-18 DIAGNOSIS — J449 Chronic obstructive pulmonary disease, unspecified: Secondary | ICD-10-CM | POA: Diagnosis not present

## 2018-12-18 DIAGNOSIS — Z20828 Contact with and (suspected) exposure to other viral communicable diseases: Secondary | ICD-10-CM | POA: Diagnosis present

## 2018-12-18 DIAGNOSIS — Z7189 Other specified counseling: Secondary | ICD-10-CM | POA: Diagnosis not present

## 2018-12-18 DIAGNOSIS — L89154 Pressure ulcer of sacral region, stage 4: Secondary | ICD-10-CM | POA: Diagnosis present

## 2018-12-18 DIAGNOSIS — J9620 Acute and chronic respiratory failure, unspecified whether with hypoxia or hypercapnia: Secondary | ICD-10-CM | POA: Diagnosis not present

## 2018-12-18 DIAGNOSIS — R0902 Hypoxemia: Secondary | ICD-10-CM | POA: Diagnosis not present

## 2018-12-18 DIAGNOSIS — L89159 Pressure ulcer of sacral region, unspecified stage: Secondary | ICD-10-CM | POA: Diagnosis present

## 2018-12-18 DIAGNOSIS — Z794 Long term (current) use of insulin: Secondary | ICD-10-CM

## 2018-12-18 DIAGNOSIS — F0280 Dementia in other diseases classified elsewhere without behavioral disturbance: Secondary | ICD-10-CM | POA: Diagnosis not present

## 2018-12-18 DIAGNOSIS — Z931 Gastrostomy status: Secondary | ICD-10-CM

## 2018-12-18 DIAGNOSIS — I4891 Unspecified atrial fibrillation: Secondary | ICD-10-CM | POA: Diagnosis present

## 2018-12-18 LAB — CBC WITH DIFFERENTIAL/PLATELET
Abs Immature Granulocytes: 0.07 10*3/uL (ref 0.00–0.07)
Basophils Absolute: 0 10*3/uL (ref 0.0–0.1)
Basophils Relative: 0 %
Eosinophils Absolute: 0.1 10*3/uL (ref 0.0–0.5)
Eosinophils Relative: 1 %
HCT: 35.4 % — ABNORMAL LOW (ref 36.0–46.0)
Hemoglobin: 10.2 g/dL — ABNORMAL LOW (ref 12.0–15.0)
Immature Granulocytes: 1 %
Lymphocytes Relative: 15 %
Lymphs Abs: 1.6 10*3/uL (ref 0.7–4.0)
MCH: 27.5 pg (ref 26.0–34.0)
MCHC: 28.8 g/dL — ABNORMAL LOW (ref 30.0–36.0)
MCV: 95.4 fL (ref 80.0–100.0)
Monocytes Absolute: 0.5 10*3/uL (ref 0.1–1.0)
Monocytes Relative: 5 %
Neutro Abs: 8.3 10*3/uL — ABNORMAL HIGH (ref 1.7–7.7)
Neutrophils Relative %: 78 %
Platelets: 217 10*3/uL (ref 150–400)
RBC: 3.71 MIL/uL — ABNORMAL LOW (ref 3.87–5.11)
RDW: 15.9 % — ABNORMAL HIGH (ref 11.5–15.5)
WBC: 10.6 10*3/uL — ABNORMAL HIGH (ref 4.0–10.5)
nRBC: 0 % (ref 0.0–0.2)

## 2018-12-18 LAB — POTASSIUM: Potassium: 6.2 mmol/L — ABNORMAL HIGH (ref 3.5–5.1)

## 2018-12-18 LAB — COMPREHENSIVE METABOLIC PANEL
ALT: 43 U/L (ref 0–44)
AST: 32 U/L (ref 15–41)
Albumin: 1.7 g/dL — ABNORMAL LOW (ref 3.5–5.0)
Alkaline Phosphatase: 131 U/L — ABNORMAL HIGH (ref 38–126)
Anion gap: 9 (ref 5–15)
BUN: 161 mg/dL — ABNORMAL HIGH (ref 8–23)
CO2: 23 mmol/L (ref 22–32)
Calcium: 10.2 mg/dL (ref 8.9–10.3)
Chloride: 111 mmol/L (ref 98–111)
Creatinine, Ser: 2.28 mg/dL — ABNORMAL HIGH (ref 0.44–1.00)
GFR calc Af Amer: 24 mL/min — ABNORMAL LOW (ref >60)
GFR calc non Af Amer: 21 mL/min — ABNORMAL LOW (ref >60)
Glucose, Bld: 354 mg/dL — ABNORMAL HIGH (ref 70–99)
Potassium: 6.5 mmol/L (ref 3.5–5.1)
Sodium: 143 mmol/L (ref 135–145)
Total Bilirubin: 0.3 mg/dL (ref 0.3–1.2)
Total Protein: 7 g/dL (ref 6.5–8.1)

## 2018-12-18 LAB — SARS CORONAVIRUS 2: SARS Coronavirus 2: NOT DETECTED

## 2018-12-18 LAB — VANCOMYCIN, RANDOM: Vancomycin Rm: 28

## 2018-12-18 LAB — CBG MONITORING, ED
Glucose-Capillary: 313 mg/dL — ABNORMAL HIGH (ref 70–99)
Glucose-Capillary: 266 mg/dL — ABNORMAL HIGH (ref 70–99)

## 2018-12-18 LAB — APTT: aPTT: 33 seconds (ref 24–36)

## 2018-12-18 LAB — LACTIC ACID, PLASMA: Lactic Acid, Venous: 1.4 mmol/L (ref 0.5–1.9)

## 2018-12-18 LAB — PHOSPHORUS: Phosphorus: 2.9 mg/dL (ref 2.5–4.6)

## 2018-12-18 LAB — MAGNESIUM: Magnesium: 3.1 mg/dL — ABNORMAL HIGH (ref 1.7–2.4)

## 2018-12-18 LAB — PROTIME-INR
INR: 1.1 (ref 0.8–1.2)
Prothrombin Time: 14.4 seconds (ref 11.4–15.2)

## 2018-12-18 LAB — TSH: TSH: 3.132 u[IU]/mL (ref 0.350–4.500)

## 2018-12-18 MED ORDER — RISPERIDONE 0.5 MG PO TABS
0.5000 mg | ORAL_TABLET | Freq: Every day | ORAL | Status: DC
  Filled 2018-12-18: qty 1

## 2018-12-18 MED ORDER — SODIUM CHLORIDE 0.9 % IV SOLN
2.0000 g | INTRAVENOUS | Status: DC
  Administered 2018-12-19 – 2018-12-21 (×3): 2 g via INTRAVENOUS
  Filled 2018-12-18 (×4): qty 2

## 2018-12-18 MED ORDER — INSULIN ASPART 100 UNIT/ML IV SOLN
5.0000 [IU] | Freq: Once | INTRAVENOUS | Status: AC
  Administered 2018-12-18: 5 [IU] via INTRAVENOUS

## 2018-12-18 MED ORDER — CHLORHEXIDINE GLUCONATE 0.12 % MT SOLN
5.0000 mL | Freq: Two times a day (BID) | OROMUCOSAL | Status: DC
  Administered 2018-12-19 – 2018-12-26 (×16): 5 mL via OROMUCOSAL
  Filled 2018-12-18 (×15): qty 15

## 2018-12-18 MED ORDER — VANCOMYCIN VARIABLE DOSE PER UNSTABLE RENAL FUNCTION (PHARMACIST DOSING): Status: DC

## 2018-12-18 MED ORDER — ONDANSETRON HCL 4 MG PO TABS: 4.0000 mg | ORAL_TABLET | Freq: Four times a day (QID) | ORAL | Status: DC | PRN

## 2018-12-18 MED ORDER — PANTOPRAZOLE SODIUM 40 MG PO TBEC: 40.0000 mg | DELAYED_RELEASE_TABLET | Freq: Every day | ORAL | Status: DC

## 2018-12-18 MED ORDER — ENOXAPARIN SODIUM 40 MG/0.4ML ~~LOC~~ SOLN
40.0000 mg | SUBCUTANEOUS | Status: DC
  Administered 2018-12-19 – 2018-12-20 (×2): 40 mg via SUBCUTANEOUS
  Filled 2018-12-18 (×3): qty 0.4

## 2018-12-18 MED ORDER — SODIUM CHLORIDE 0.9 % IV SOLN: 2.0000 g | Freq: Two times a day (BID) | INTRAVENOUS | Status: DC

## 2018-12-18 MED ORDER — CLONAZEPAM 0.5 MG PO TABS
0.5000 mg | ORAL_TABLET | Freq: Two times a day (BID) | ORAL | Status: DC
  Administered 2018-12-19 – 2018-12-24 (×12): 0.5 mg
  Filled 2018-12-18 (×12): qty 1

## 2018-12-18 MED ORDER — INSULIN ASPART 100 UNIT/ML ~~LOC~~ SOLN
0.0000 [IU] | SUBCUTANEOUS | Status: DC
  Administered 2018-12-19: 1 [IU] via SUBCUTANEOUS
  Administered 2018-12-19: 3 [IU] via SUBCUTANEOUS
  Administered 2018-12-19: 1 [IU] via SUBCUTANEOUS
  Administered 2018-12-19: 5 [IU] via SUBCUTANEOUS
  Administered 2018-12-19: 2 [IU] via SUBCUTANEOUS
  Administered 2018-12-19: 1 [IU] via SUBCUTANEOUS
  Administered 2018-12-20: 3 [IU] via SUBCUTANEOUS
  Administered 2018-12-20: 2 [IU] via SUBCUTANEOUS
  Administered 2018-12-20: 5 [IU] via SUBCUTANEOUS
  Administered 2018-12-20: 2 [IU] via SUBCUTANEOUS
  Administered 2018-12-20: 5 [IU] via SUBCUTANEOUS
  Administered 2018-12-20: 2 [IU] via SUBCUTANEOUS
  Administered 2018-12-21: 3 [IU] via SUBCUTANEOUS
  Administered 2018-12-21 (×3): 5 [IU] via SUBCUTANEOUS
  Administered 2018-12-21: 3 [IU] via SUBCUTANEOUS
  Administered 2018-12-21 (×2): 5 [IU] via SUBCUTANEOUS
  Administered 2018-12-22 (×3): 3 [IU] via SUBCUTANEOUS
  Administered 2018-12-22: 5 [IU] via SUBCUTANEOUS
  Administered 2018-12-22 – 2018-12-23 (×3): 3 [IU] via SUBCUTANEOUS
  Administered 2018-12-23: 5 [IU] via SUBCUTANEOUS
  Administered 2018-12-23 (×2): 3 [IU] via SUBCUTANEOUS
  Administered 2018-12-23: 5 [IU] via SUBCUTANEOUS
  Administered 2018-12-24: 1 [IU] via SUBCUTANEOUS
  Administered 2018-12-24 (×3): 2 [IU] via SUBCUTANEOUS

## 2018-12-18 MED ORDER — ALBUTEROL SULFATE (2.5 MG/3ML) 0.083% IN NEBU: 5.0000 mg | INHALATION_SOLUTION | Freq: Once | RESPIRATORY_TRACT | Status: DC

## 2018-12-18 MED ORDER — SODIUM BICARBONATE 8.4 % IV SOLN
50.0000 meq | Freq: Once | INTRAVENOUS | Status: AC
  Administered 2018-12-18: 50 meq via INTRAVENOUS
  Filled 2018-12-18: qty 50

## 2018-12-18 MED ORDER — DEXTROSE 50 % IV SOLN
1.0000 | Freq: Once | INTRAVENOUS | Status: AC
  Administered 2018-12-18: 50 mL via INTRAVENOUS
  Filled 2018-12-18: qty 50

## 2018-12-18 MED ORDER — ACETAMINOPHEN 325 MG PO TABS: 650.0000 mg | ORAL_TABLET | Freq: Four times a day (QID) | ORAL | Status: DC | PRN

## 2018-12-18 MED ORDER — SODIUM ZIRCONIUM CYCLOSILICATE 10 G PO PACK
10.0000 g | PACK | Freq: Once | ORAL | Status: DC
  Filled 2018-12-18: qty 1

## 2018-12-18 MED ORDER — SODIUM CHLORIDE 0.9 % IV BOLUS
2000.0000 mL | Freq: Once | INTRAVENOUS | Status: AC
  Administered 2018-12-18: 2000 mL via INTRAVENOUS

## 2018-12-18 MED ORDER — ACETAMINOPHEN 650 MG RE SUPP: 650.0000 mg | Freq: Four times a day (QID) | RECTAL | Status: DC | PRN

## 2018-12-18 MED ORDER — ALBUTEROL SULFATE (2.5 MG/3ML) 0.083% IN NEBU: 2.5000 mg | INHALATION_SOLUTION | RESPIRATORY_TRACT | Status: DC | PRN

## 2018-12-18 MED ORDER — SODIUM ZIRCONIUM CYCLOSILICATE 10 G PO PACK
10.0000 g | PACK | Freq: Three times a day (TID) | ORAL | Status: DC
  Filled 2018-12-18: qty 1

## 2018-12-18 MED ORDER — ALBUTEROL (5 MG/ML) CONTINUOUS INHALATION SOLN
5.0000 mg/h | INHALATION_SOLUTION | RESPIRATORY_TRACT | Status: AC
  Administered 2018-12-18: 5 mg/h via RESPIRATORY_TRACT
  Filled 2018-12-18: qty 20

## 2018-12-18 MED ORDER — SODIUM CHLORIDE 0.9 % IV SOLN
INTRAVENOUS | Status: DC
  Administered 2018-12-19: 01:00:00 via INTRAVENOUS

## 2018-12-18 MED ORDER — POLYETHYLENE GLYCOL 3350 17 G PO PACK: 17.0000 g | PACK | Freq: Every day | ORAL | Status: DC

## 2018-12-18 MED ORDER — CALCIUM GLUCONATE 10 % IV SOLN
1.0000 g | Freq: Once | INTRAVENOUS | Status: DC
  Filled 2018-12-18: qty 10

## 2018-12-18 MED ORDER — FENTANYL 50 MCG/HR TD PT72
1.0000 | MEDICATED_PATCH | TRANSDERMAL | Status: DC
  Administered 2018-12-21 – 2018-12-24 (×2): 1 via TRANSDERMAL
  Filled 2018-12-18 (×4): qty 1

## 2018-12-18 MED ORDER — SODIUM CHLORIDE 0.9 % IV SOLN
2.0000 g | Freq: Once | INTRAVENOUS | Status: AC
  Administered 2018-12-18: 2 g via INTRAVENOUS
  Filled 2018-12-18: qty 2

## 2018-12-18 MED ORDER — LEVOTHYROXINE SODIUM 50 MCG PO TABS
50.0000 ug | ORAL_TABLET | Freq: Every day | ORAL | Status: DC
  Administered 2018-12-19 – 2018-12-24 (×6): 50 ug
  Filled 2018-12-18 (×6): qty 1

## 2018-12-18 MED ORDER — ONDANSETRON HCL 4 MG/2ML IJ SOLN: 4.0000 mg | Freq: Four times a day (QID) | INTRAMUSCULAR | Status: DC | PRN

## 2018-12-18 MED ORDER — SCOPOLAMINE 1 MG/3DAYS TD PT72
1.0000 | MEDICATED_PATCH | TRANSDERMAL | Status: DC
  Administered 2018-12-21 – 2018-12-24 (×2): 1.5 mg via TRANSDERMAL
  Filled 2018-12-18 (×2): qty 1

## 2018-12-18 MED ORDER — SODIUM ZIRCONIUM CYCLOSILICATE 10 G PO PACK
10.0000 g | PACK | Freq: Once | ORAL | Status: AC
  Administered 2018-12-18: 10 g via ORAL
  Filled 2018-12-18: qty 1

## 2018-12-18 NOTE — Consult Note (Signed)
Referring Provider: No ref. provider found Primary Care Physician:  System, Pcp Not In Primary Nephrologist:     Reason for Consultation:   Acute renal insufficiency, maintenance of euvolemia, correction of electrolytes and acid-base abnormalities, assessment of azotemia.  HPI: This is a 73 year old lady with a history of dementia status post tracheostomy for chronic respiratory failure resident [facility].  Brought to the emergency room for evaluation of hypotension.  Past medical history includes COPD, chronic atrial fibrillation as well as type 2 diabetes [facility] she required intubation 10/23/2018 with history of cardiac arrest requiring CPR.  She underwent placement of tracheostomy 11/06/2018.  She underwent G-tube placement 11/21/2018 for enteral access.  e.  It appears that [facility] had [facility] and since that time has been resident of [facility]. She has a right upper extremity PICC line and has been receiving vancomycin and cefepime.  I do not have access to the [facility] records at this present time but it appears that she has had progressive decline in renal function.  Medications prior to admission cefepime 1 g every 12 hours, Lopressor 25 mg twice daily, vancomycin 1 g daily, insulin glargine 12 units twice daily, Synthroid 50 mcg daily, Protonix 40 mg daily, Risperdal 0.5 mg nightly Scopolamine patch every 3 days fentanyl patch every 3 days and clonazepam 0.5 mg twice daily.  Current medications cefepime 2 g, Risperdal 0.5 mg nightly insulin sliding scale, levothyroxine 50 mcg daily, scopolamine 1.5 mg every 72 hours, Lovenox 40 mg subcu  IV sodium chloride at 125 cc an hour.  Administered Lokelma 10 g orally x1 dose  Blood pressure 112/62 pulse 59 temperature 96.5 O2 sats 99% 35% FiO2 via trach.  Sodium 143 potassium 6.5 chloride 111 CO2 23 glucose 354 BUN 161 creatinine 2.28 calcium 10.2  phosphorus 2.9 magnesium 2.1 albumin 1.7 alkaline phosphatase 131 AST 32 ALT 43 WBC 10.6 hemoglobin 10.2 platelets 217 INR 1.1.  Coronavirus screen negative  Chest x-ray right lower lobe atelectasis.  No recorded urine output.     Past Medical History:  Diagnosis Date  . Acute on chronic respiratory failure with hypoxia (HCC)   . Aspiration pneumonia due to gastric secretions (HCC)   . Atrial fibrillation with RVR (HCC)   . Chronic congestive heart failure with left ventricular diastolic dysfunction (HCC)   . COPD, severe (HCC)     Past Surgical History:  Procedure Laterality Date  . IR GASTROSTOMY TUBE MOD SED  11/21/2018  . TRACHEOSTOMY TUBE PLACEMENT N/A 11/06/2018   Procedure: TRACHEOSTOMY;  Surgeon: Drema HalonNewman, Christopher E, MD;  [facility];  Service: ENT;  Laterality: N/A;    Prior to Admission medications   Medication Sig Start Date End Date Taking? Authorizing Provider  ceFEPIme 1 g in sodium chloride 0.9 % 100 mL Inject 1 g into the vein every 12 (twelve) hours. For 7 days 12/14/18  Yes [provider]  chlorhexidine (PERIDEX) 0.12 % solution Use as directed 5 mLs in the mouth or throat See admin instructions. Every shift starting   Yes [provider]  clonazePAM (KLONOPIN) 0.5 MG tablet Place 0.5 mg into feeding tube 2 (two) times daily.   Yes [provider]  fentaNYL (DURAGESIC) 50 MCG/HR Place 1 patch onto the skin every 3 (three) days.   Yes [provider]  insulin glargine (LANTUS) 100 UNIT/ML injection Inject 12 Units into the skin 2 (two) times daily.   Yes [provider]  levothyroxine (SYNTHROID) 50 MCG tablet Place 50 mcg into feeding tube daily.   Yes [provider]  metoprolol tartrate (LOPRESSOR) 25 MG tablet Take 25 mg by mouth 2 (two) times daily.   Yes [provider]  Omega-3 Fatty Acids (FISH OIL) 1000 MG CAPS Take 1,000 mg by mouth daily.   Yes [provider]  pantoprazole  (PROTONIX) 40 MG tablet Take 40 mg by mouth daily.   Yes [provider]  polyethylene glycol (MIRALAX / GLYCOLAX) 17 g packet Take 17 g by mouth daily.   Yes [provider]  risperiDONE (RISPERDAL) 0.5 MG tablet Take 0.5 mg by mouth at bedtime.   Yes [provider]  scopolamine (TRANSDERM-SCOP) 1 MG/3DAYS Place 1 patch onto the skin every 3 (three) days.   Yes [provider]  vancomycin 1,000 mg in sodium chloride 0.9 % 250 mL Inject 1,000 mg into the vein daily. For 7 days 12/15/18  Yes [provider]    Current Facility-Administered Medications  Medication Dose Route Frequency Provider Last Rate Last Dose  . 0.9 %  sodium chloride infusion   Intravenous Continuous Hillary Bow, DO      . acetaminophen (TYLENOL) tablet 650 mg  650 mg Oral Q6H PRN Hillary Bow, DO       Or  . acetaminophen (TYLENOL) suppository 650 mg  650 mg Rectal Q6H PRN Hillary Bow, DO      . calcium gluconate inj 10% (1 g) URGENT USE ONLY!  1 g Intravenous Once Derwood Kaplan, MD      . Melene Muller ON 12/19/2018] ceFEPIme (MAXIPIME) 2 g in sodium chloride 0.9 % 100 mL IVPB  2 g Intravenous Q12H Nanavati, Ankit, MD      . chlorhexidine (PERIDEX) 0.12 % solution 5 mL  5 mL Mouth/Throat See admin instructions Hillary Bow, DO      . clonazePAM Scarlette Calico) tablet 0.5 mg  0.5 mg Per Tube BID Lyda Perone M, DO      . enoxaparin (LOVENOX) injection 40 mg  40 mg Subcutaneous Q24H Julian Reil, Jared M, DO      . fentaNYL (DURAGESIC) 50 MCG/HR 1 patch  1 patch Transdermal Q72H Lyda Perone M, DO      . insulin aspart (novoLOG) injection 0-9 Units  0-9 Units Subcutaneous Q4H Lyda Perone M, DO      . [START ON 12/19/2018] levothyroxine (SYNTHROID) tablet 50 mcg  50 mcg Per Tube Daily Lyda Perone M, DO      . ondansetron [facility]) tablet 4 mg  4 mg Oral Q6H PRN Hillary Bow, DO       Or  . ondansetron [facility]) injection 4 mg  4 mg Intravenous Q6H PRN Hillary Bow,  DO      . [START ON 12/19/2018] pantoprazole (PROTONIX) EC tablet 40 mg  40 mg Oral Daily Hillary Bow, DO      . [START ON 12/19/2018] polyethylene glycol (MIRALAX / GLYCOLAX) packet 17 g  17 g Oral Daily Julian Reil, Jared M, DO      . risperiDONE (RISPERDAL) tablet 0.5 mg  0.5 mg Oral QHS Julian Reil, Jared M, DO      . scopolamine (TRANSDERM-SCOP) 1 MG/3DAYS 1.5 mg  1 patch Transdermal Q72H Hillary Bow, DO       Current Outpatient Medications  Medication Sig Dispense Refill  . ceFEPIme 1 g in sodium chloride 0.9 % 100 mL Inject 1 g into the vein  every 12 (twelve) hours. For 7 days    . chlorhexidine (PERIDEX) 0.12 % solution Use as directed 5 mLs in the mouth or throat See admin instructions. Every shift starting    . clonazePAM (KLONOPIN) 0.5 MG tablet Place 0.5 mg into feeding tube 2 (two) times daily.    . fentaNYL (DURAGESIC) 50 MCG/HR Place 1 patch onto the skin every 3 (three) days.    . insulin glargine (LANTUS) 100 UNIT/ML injection Inject 12 Units into the skin 2 (two) times daily.    Marland Kitchen. levothyroxine (SYNTHROID) 50 MCG tablet Place 50 mcg into feeding tube daily.    . metoprolol tartrate (LOPRESSOR) 25 MG tablet Take 25 mg by mouth 2 (two) times daily.    . Omega-3 Fatty Acids (FISH OIL) 1000 MG CAPS Take 1,000 mg by mouth daily.    . pantoprazole (PROTONIX) 40 MG tablet Take 40 mg by mouth daily.    . polyethylene glycol (MIRALAX / GLYCOLAX) 17 g packet Take 17 g by mouth daily.    . risperiDONE (RISPERDAL) 0.5 MG tablet Take 0.5 mg by mouth at bedtime.    Marland Kitchen. scopolamine (TRANSDERM-SCOP) 1 MG/3DAYS Place 1 patch onto the skin every 3 (three) days.    . vancomycin 1,000 mg in sodium chloride 0.9 % 250 mL Inject 1,000 mg into the vein daily. For 7 days      Allergies as of 12/16/2018  . (No Known Allergies)    History reviewed. No pertinent family history.  Social History   Socioeconomic History  . Marital status: Single    Spouse name: Not on file  . Number of children:  Not on file  . Years of education: Not on file  . Highest education level: Not on file  Occupational History  . Not on file  Social Needs  . Financial resource strain: Not on file  . Food insecurity    Worry: Not on file    Inability: Not on file  . Transportation needs    Medical: Not on file    Non-medical: Not on file  Tobacco Use  . Smoking status: Unknown If Ever Smoked  Substance and Sexual Activity  . Alcohol use: Not Currently  . Drug use: Never  . Sexual activity: Not on file  Lifestyle  . Physical activity    Days per week: Not on file    Minutes per session: Not on file  . Stress: Not on file  Relationships  . Social Musicianconnections    Talks on phone: Not on file    Gets together: Not on file    Attends religious service: Not on file    Active member of club or organization: Not on file    Attends meetings of clubs or organizations: Not on file    Relationship status: Not on file  . Intimate partner violence    Fear of current or ex partner: Not on file    Emotionally abused: Not on file    Physically abused: Not on file    Forced sexual activity: Not on file  Other Topics Concern  . Not on file  Social History Narrative  . Not on file    Review of Systems: Patient was unable to participate in complete review of systems.  She had a G-tube and Foley catheter and trach.  Physical Exam: Vital signs in last 24 hours: Temp:  [96.5 F (35.8 C)] 96.5 F (35.8 C) (06/11 1729) Pulse Rate:  [56-82] 59 (06/11 2030) Resp:  [  14-26] 14 (06/11 2030) BP: (93-127)/(52-102) 112/62 (06/11 2030) SpO2:  [95 %-100 %] 99 % (06/11 2030) FiO2 (%):  [35 %] 35 % (06/11 1810)   General: Ill-appearing lady awake but not following commands Head:  Normocephalic and atraumatic. Eyes:  Sclera clear, no icterus.   Conjunctiva pink. Ears:  Normal auditory acuity. Nose:  No deformity, discharge,  or lesions. Mouth:  No deformity or lesions, dentition normal. Neck: Tracheostomy tube  in place Lungs: Rhonchorous breath sounds heard throughout lung fields Heart:  Regular rate and rhythm; no appreciable murmur rub or gallop Abdomen:  Soft, nontender with G-tube Msk:  Symmetrical without gross deformities. Normal posture. Pulses:  No carotid, renal, femoral bruits. DP and PT symmetrical and equal Extremities:  Without clubbing   trace lower extremity swelling Neurologic: Awake and alert unable to assess orientation Skin: No bruising no skin lesions noted Cervical Nodes:  No significant cervical adenopathy.   Intake/Output from previous day: No intake/output data recorded. Intake/Output this shift: Total I/O In: 1000 [IV Piggyback:1000] Out: -   Lab Results: Recent Labs    01/01/2019 1834  WBC 10.6*  HGB 10.2*  HCT 35.4*  PLT 217   BMET Recent Labs    12/20/2018 1834  NA 143  K 6.5*  CL 111  CO2 23  GLUCOSE 354*  BUN 161*  CREATININE 2.28*  CALCIUM 10.2  PHOS 2.9   LFT Recent Labs    12/10/2018 1834  PROT 7.0  ALBUMIN 1.7*  AST 32  ALT 43  ALKPHOS 131*  BILITOT 0.3   PT/INR Recent Labs    12/11/2018 1834  LABPROT 14.4  INR 1.1   Hepatitis Panel No results for input(s): HEPBSAG, HCVAB, HEPAIGM, HEPBIGM in the last 72 hours.  Studies/Results: Dg Chest Port 1 View  Result Date: 01/02/2019 CLINICAL DATA:  Hemoptysis. EXAM: PORTABLE CHEST 1 VIEW COMPARISON:  Radiograph of Nov 17, 2018. FINDINGS: Stable cardiomegaly. Nasogastric tube has been removed. Tracheostomy tube is unchanged in position. No pneumothorax is noted. Left lung is clear. Stable right basilar atelectasis or infiltrate is noted. Bony thorax is unremarkable. IMPRESSION: Stable right basilar atelectasis or infiltrate is noted. Electronically Signed   By: Marijo Conception M.D.   On: 01/06/2019 18:41    Assessment/Plan:  Acute kidney injury with progressive decline in renal function.  She has marked azotemia out of proportion to increase in a creatinine.  She has poor protoplasm  with tracheostomy G-tube and ventilator dependent respiratory failure.  Her creatinine may not be elevated substantially due to poor muscle mass.  Her GFR may be a lot worse than a calculated GFR.  Contributing to her azotemia could possibly be secondary to volume depletion and hypercatabolic state with nutritional supplementation through G-tube that constitutes high protein diet.  I do not see the use of exogenous steroids.  She does not appear to be making much urine.  I agree with volume resuscitation in the first instance.  She is not a candidate for long-term dialysis.  CODE STATUS would suggest that she would be amenable for short-term dialytic support.  We will try to treat him medically initially.  She will need abdominal ultrasound to rule out obstruction.  Would send urine for analysis including microscopy.  Avoid nephrotoxins IV contrast ACE inhibitors and ARB's as well as Cox 2 inhibitors and nonsteroidal anti-inflammatory drugs.  Etiology of her renal failure appears to be multifactorial setting of sepsis hypotension and shock.  He has been on antibiotic therapy including vancomycin  could be contributing to acute tubular necrosis or acute allergic interstitial nephritis.  Acute glomerulonephritis would also be in the differential.  Urinalysis and microscopy could help elucidate cause.  Hypotension and shock, it does not appear that she has an acute GI bleed although serial hemoglobin should be followed and careful observation for GI blood loss.  2D echo should be considered, blood cultures sent already.  Serum cortisol levels could also be checked.  Would also continue IV fluids maintenance 125 cc an hour normal saline  Hyperkalemia would continue Lokelma, albuterol with insulin and glucose and calcium gluconate.  IV bicarbonate.   .  She may need dialytic support.  We will see how her potassium responds to conservative measures.  We will recheck potassium at 5 AM  Anemia appears to be not an  issue at this time we will continue to follow serum hemoglobin  Sepsis would check blood cultures continue cefepime and check a vancomycin level.  Etiology could be secondary to aspiration pneumonia.  Severe COPD with respiratory failure and tracheostomy  Congestive heart failure recommend 2D echo  Diabetes mellitus recommend insulin sliding scale this could also help with the hyperkalemia.     LOS: 0 Garnetta BuddyMartin W Bula Cavalieri @TODAY @9 :11 PM

## 2018-12-18 NOTE — ED Notes (Signed)
Pt cleaned after bowel movement. Purwick placed.

## 2018-12-18 NOTE — Progress Notes (Addendum)
Pharmacy Antibiotic Note  Shannon Lang is a 73 y.o. female admitted on 12/12/2018 with sepsis.  Pharmacy has been consulted for cefepime dosing. Tmax 96.5, WBC 10.6, with SOB and cough. Patient received a dose of cefepime this morning at [facility]. Will continue therapy after speaking with Dr. Kathrynn Humble. Pt in AKI with Scr 2.28 and estimated CrCl at 35mL/min.  Plan: Cefepime 2g q12hrs Monitor renal function, LOT, culture data F/u plans to continue PTA vancomycin (last received 6/11 AM)  Temp (24hrs), Avg:96.5 F (35.8 C), Min:96.5 F (35.8 C), Max:96.5 F (35.8 C)  Recent Labs  Lab 12/17/2018 1834  WBC 10.6*  CREATININE 2.28*    CrCl cannot be calculated (Unknown ideal weight.).    No Known Allergies  Antimicrobials this admission: Cefepime 6/7 PTA >> Vancomycin 6/8 PTA  Thank you for involving pharmacy in this patient's care.  Janae Bridgeman, PharmD PGY1 Pharmacy Resident Phone: [phone] 12/31/2018 8:54 PM   ADDENDUM: Pharmacy consulted to add vancomycin. Patient was receiving vancomycin 1g IV q24h at [facility] per [facility] since 6/8, last dose on 6/11 at 0900.  Noted, patient is trach, G-tube, and VDRF. Patient has marked azotemia and is not making urine. SCr may not be accurate due to poor muscle mass. Stat vancomycin random level was 28 tonight.  Plan: No further vancomycin tonight - f/u SCr trend in AM  Change cefepime to 2g IV q24h Monitor clinical progress, c/s, renal function F/u de-escalation plan/LOT, vancomycin levels as indicated F/u Nephrology plans  Elicia Lamp, PharmD, BCPS Please check AMION for all [place] contact numbers Clinical Pharmacist 01/06/2019 8:55 PM

## 2018-12-18 NOTE — Progress Notes (Signed)
Pharmacy Antibiotic Note  Shannon Lang is a 73 y.o. female admitted on 12-21-18 with sepsis.  Pharmacy has been consulted for cefepime dosing. Tmax 96.5, WBC 10.6, with SOB and cough. Patient received a dose of cefepime this morning at [facility]. Will continue therapy after speaking with Dr. Kathrynn Humble. Pt in AKI with Scr 2.28 and estimated CrCl at 55mL/min.  Plan: Cefepime 2g q12hrs Monitor renal function, LOT, culture data F/u plans to continue PTA vancomycin (last received 6/11 AM)  Temp (24hrs), Avg:96.5 F (35.8 C), Min:96.5 F (35.8 C), Max:96.5 F (35.8 C)  No results for input(s): WBC, CREATININE, LATICACIDVEN, VANCOTROUGH, VANCOPEAK, VANCORANDOM, GENTTROUGH, GENTPEAK, GENTRANDOM, TOBRATROUGH, TOBRAPEAK, TOBRARND, AMIKACINPEAK, AMIKACINTROU, AMIKACIN in the last 168 hours.  CrCl cannot be calculated (Patient's most recent lab result is older than the maximum 21 days allowed.).    Not on File  Antimicrobials this admission: Cefepime PTA >> Vancomycin PTA  Thank you for involving pharmacy in this patient's care.  Janae Bridgeman, PharmD PGY1 Pharmacy Resident Phone: [phone] Dec 21, 2018 6:39 PM

## 2018-12-18 NOTE — ED Notes (Signed)
Patient transported to Ultrasound 

## 2018-12-18 NOTE — H&P (Signed)
History and Physical    Shannon Lang AVW:[id number] DOB: 11/30/1945 DOA: 12/19/2018  PCP: System, Pcp Not In  Patient coming from: [facility]  I have personally briefly reviewed patient's old medical records in [facility]  Chief Complaint: AKF  HPI: Shannon Lang is a 73 y.o. female with medical history significant of dementia, COPD, Respiratory failure requiring intubation on 4/16, also apparently cardiac arrest requiring CPR.  Patient s/p tracheostomy (4/30) and PEG placement (5/15).  Patient resides at [facility] since that time.  Patient has been having fever, increasing O2 requirement at [facility].  Was apparently started on cefepime and vanc there recently.  Today apparently also had AKF and hypotension which prompted them to send her in to the ED.   ED Course: BUN 161, CREAT 2.28, K 6.5, WBC 10.6k, CXR showing R basilar actelectasis or infiltrate, "stable" from CXR on 5/11 (when she was believed to have aspiration PNA).  Last labs available are from 5/20 which show creat 1.2, BUN 58.  COVID testing is pending.  Initial BP was on the low side (looks like 90s) now has improved to 112/62 with pulse 59, T 96.5, after 2L bolus  Also given Lokelma, insulin, glucose, amp of bicarb, in ED for K 6.5.  PCCM and Nephrology were consulted and hospitalist ultimately asked to admit.   Review of Systems: unable to perform due to AMS  Past Medical History:  Diagnosis Date   Acute on chronic respiratory failure with hypoxia (HCC)    Aspiration pneumonia due to gastric secretions (HCC)    Atrial fibrillation with RVR (HCC)    Chronic congestive heart failure with left ventricular diastolic dysfunction (HCC)    COPD, severe (HCC)     Past Surgical History:  Procedure Laterality Date   IR GASTROSTOMY TUBE MOD SED  11/21/2018   TRACHEOSTOMY TUBE PLACEMENT N/A 11/06/2018   Procedure: TRACHEOSTOMY;  Surgeon: Drema HalonNewman, Christopher E, MD;  [facility];  Service: ENT;  Laterality:  N/A;     reports previous alcohol use. She reports that she does not use drugs. No history on file for tobacco.  No Known Allergies  History reviewed. No pertinent family history. Unable to obtain secondary to patient mental status.  Prior to Admission medications   Medication Sig Start Date End Date Taking? Authorizing Provider  ceFEPIme 1 g in sodium chloride 0.9 % 100 mL Inject 1 g into the vein every 12 (twelve) hours. For 7 days 12/14/18  Yes [provider]  chlorhexidine (PERIDEX) 0.12 % solution Use as directed 5 mLs in the mouth or throat See admin instructions. Every shift starting   Yes [provider]  clonazePAM (KLONOPIN) 0.5 MG tablet Place 0.5 mg into feeding tube 2 (two) times daily.   Yes [provider]  fentaNYL (DURAGESIC) 50 MCG/HR Place 1 patch onto the skin every 3 (three) days.   Yes [provider]  insulin glargine (LANTUS) 100 UNIT/ML injection Inject 12 Units into the skin 2 (two) times daily.   Yes [provider]  levothyroxine (SYNTHROID) 50 MCG tablet Place 50 mcg into feeding tube daily.   Yes [provider]  metoprolol tartrate (LOPRESSOR) 25 MG tablet Take 25 mg by mouth 2 (two) times daily.   Yes [provider]  Omega-3 Fatty Acids (FISH OIL) 1000 MG CAPS Take 1,000 mg by mouth daily.   Yes [provider]  pantoprazole (PROTONIX) 40 MG tablet Take 40 mg by mouth daily.   Yes [provider]  polyethylene glycol (MIRALAX / GLYCOLAX) 17 g packet Take 17 g by mouth daily.   Yes [provider]  risperiDONE (RISPERDAL) 0.5 MG tablet Take 0.5 mg by mouth at bedtime.   Yes [provider]  scopolamine (TRANSDERM-SCOP) 1 MG/3DAYS Place 1 patch onto the skin every 3 (three) days.   Yes [provider]  vancomycin 1,000 mg in sodium chloride 0.9 % 250 mL Inject 1,000 mg into the vein daily. For 7 days 12/15/18  Yes [provider]    Physical  Exam: Vitals:   2018-11-21 1936 2018-11-21 1937 2018-11-21 2000 2018-11-21 2030  BP:   127/62 112/62  Pulse: (!) 57 (!) 58 (!) 59 (!) 59  Resp: 19 (!) 26 14 14   Temp:      TempSrc:      SpO2: 99% 100% 100% 99%    Constitutional: NAD, intermittent episodes of shaking Eyes: PERRL, lids and conjunctivae normal ENMT: Tracheostomy in place Neck: normal, supple, no masses, no thyromegaly Respiratory: Crackles, diminished RLL Cardiovascular: Regular rate and rhythm, no murmurs / rubs / gallops. Abdomen: no tenderness, no masses palpated. No hepatosplenomegaly. Bowel sounds positive. Gastrostomy tube in place, no surrounding cellulitis Musculoskeletal: no clubbing / cyanosis. No joint deformity upper and lower extremities. Good ROM, no contractures. Normal muscle tone.  Skin: no rashes, lesions, ulcers. No induration Neurologic: Intermittent tremor of BUE.  Patient non-verbal and noncommunicative Psychiatric: Patient non-verbal and noncommunicative.   Labs on Admission: I have personally reviewed following labs and imaging studies  CBC: Recent Labs  Lab 2018-11-21 1834  WBC 10.6*  NEUTROABS 8.3*  HGB 10.2*  HCT 35.4*  MCV 95.4  PLT 217   Basic Metabolic Panel: Recent Labs  Lab 2018-11-21 1834  NA 143  K 6.5*  CL 111  CO2 23  GLUCOSE 354*  BUN 161*  CREATININE 2.28*  CALCIUM 10.2  MG 3.1*  PHOS 2.9   GFR: CrCl cannot be calculated (Unknown ideal weight.). Liver Function Tests: Recent Labs  Lab 2018-11-21 1834  AST 32  ALT 43  ALKPHOS 131*  BILITOT 0.3  PROT 7.0  ALBUMIN 1.7*   No results for input(s): LIPASE, AMYLASE in the last 168 hours. No results for input(s): AMMONIA in the last 168 hours. Coagulation Profile: Recent Labs  Lab 2018-11-21 1834  INR 1.1   Cardiac Enzymes: No results for input(s): CKTOTAL, CKMB, CKMBINDEX, TROPONINI in the last 168 hours. BNP (last 3 results) No results for input(s): PROBNP in the last 8760 hours. HbA1C: No results for input(s):  HGBA1C in the last 72 hours. CBG: Recent Labs  Lab 2018-11-21 1758  GLUCAP 313*   Lipid Profile: No results for input(s): CHOL, HDL, LDLCALC, TRIG, CHOLHDL, LDLDIRECT in the last 72 hours. Thyroid Function Tests: No results for input(s): TSH, T4TOTAL, FREET4, T3FREE, THYROIDAB in the last 72 hours. Anemia Panel: No results for input(s): VITAMINB12, FOLATE, FERRITIN, TIBC, IRON, RETICCTPCT in the last 72 hours. Urine analysis:    Component Value Date/Time   COLORURINE YELLOW 10/18/2018 1710   APPEARANCEUR HAZY (A) 10/18/2018 1710   LABSPEC 1.017 10/18/2018 1710   PHURINE 6.0 10/18/2018 1710   GLUCOSEU NEGATIVE 10/18/2018 1710   HGBUR NEGATIVE 10/18/2018 1710   BILIRUBINUR NEGATIVE 10/18/2018 1710   KETONESUR 5 (A) 10/18/2018 1710   PROTEINUR 100 (A) 10/18/2018 1710   NITRITE NEGATIVE 10/18/2018 1710   LEUKOCYTESUR NEGATIVE 10/18/2018 1710    Radiological Exams on Admission: Dg Chest Port 1 View  Result Date: August 13, 2018 CLINICAL DATA:  Hemoptysis. EXAM: PORTABLE CHEST 1 VIEW COMPARISON:  Radiograph of Nov 17, 2018. FINDINGS: Stable cardiomegaly. Nasogastric tube has been removed. Tracheostomy tube is unchanged in position. No pneumothorax is noted. Left lung is clear. Stable right basilar atelectasis or infiltrate is noted. Bony thorax is unremarkable. IMPRESSION: Stable right basilar atelectasis or infiltrate is noted. Electronically Signed   By: Lupita RaiderJames  Green Jr M.D.   On: 01/03/2019 18:41    EKG: Independently reviewed.  Assessment/Plan Principal Problem:   AKI (acute kidney injury) (HCC) Active Problems:   Acute on chronic respiratory failure with hypoxia (HCC)   A-fib (HCC)   Chronic congestive heart failure with left ventricular diastolic dysfunction (HCC)   COPD, severe (HCC)   Dementia (HCC)   DM2 (diabetes mellitus, type 2) (HCC)   Fever   Hyperkalemia   Sacral decubitus ulcer    1. AKI with hyperkalemia - likely pre-renal / dehydration given the BUN/Creat  ratio, hypotension 1. Nephro consulted 2. IVF: 2L bolus in ED, 125 cc/hr NS 3. Strict intake and output 4. US renal 5. UA pending 6. Checking random vanc level now 7. Repeat K now, and again at 0500 8. Continue Lokelma scheduled per nephrology 9. Got insulin, D50, amp of bicarb, lokelma, in ED 10. Corrected calcium is already very high at 12. So not giving calcium at this time for the hypokalemia. 11. Will order 5mg  cont albuterol neb for next 1h to see if we can drive some K into cells. 2. Acute on chronic resp failure with hypoxia, fever, RLL infiltrate, suspect HCAP - 1. Empiric cefepime / vanc  2. BCx 3. Culture tracheal aspirate 4. IVF as above 5. Adult wheeze protocol for neb treatments if needed (not wheezing now though) 3. Trach and PEG status - 1. Trach care per RRT 2. Dietary consult for TF 3. Continue fentanyl patch 4. PAF - 1. Currently NSR 2. Holding metoprolol due to initial presentation with hypotension 3. Tele monitor and watch for A.Fib RVR if she develops 5. DM2 - 1. Sensitive SSI Q4H for the moment 6. Chronic CHF - 1. Watch for fluid overload with resuscitation 7. Dementia - 1. Continue Klonopin, risperdal 8. Sacral decubitus - Stage 3-4 1. Wound care consult  DVT prophylaxis: Lovenox Code Status: Full Code for now - will get pal care involved and see if we can start to get family to re-consider Family Communication: No family in room Disposition Plan: LTAC after admit Consults called: PCCM, nephrology Admission status: Admit to inpatient  Severity of Illness: The appropriate patient status for this patient is INPATIENT. Inpatient status is judged to be reasonable and necessary in order to provide the required intensity of service to ensure the patient's safety. The patient's presenting symptoms, physical exam findings, and initial radiographic and laboratory data in the context of their chronic comorbidities is felt to place them at high risk for further  clinical deterioration. Furthermore, it is not anticipated that the patient will be medically stable for discharge from the hospital within 2 midnights of admission. The following factors support the patient status of inpatient.   IP status for AKF with hyperkalemia, BUN 160.  Also acute on chronic resp failure with increasing O2 requirement.  Fever, hypotension, RLL infiltrate, all creating picture suspicious for HCAP + Sepsis.   * I certify that at the point of admission it is my clinical judgment that the patient will require inpatient hospital care spanning beyond 2 midnights from the point of admission due to high intensity of  service, high risk for further deterioration and high frequency of surveillance required.*    Orson Rho M. DO [facility]  How to contact the [facility] Attending or Consulting provider [place] or covering provider during after hours [place], for this patient?  1. Check the care team in [facility] and look for a) attending/consulting [facility] provider listed and b) the [facility] team listed 2. Log into [email or web address]  Amion Physician Scheduling and messaging for groups and whole hospitals  On call and physician scheduling software for group practices, residents, hospitalists and other medical providers for call, clinic, rotation and shift schedules. On[employer] is a hospital-wide system for scheduling doctors and paging doctors on call. EasyPlot is for scientific plotting and data analysis.  [email or web address]  and use [place] universal password to access. If you do not have the password, please contact the hospital operator.  3. Locate the [facility] provider you are looking for under [facility] and page to a number that you can be directly reached. 4. If you still have difficulty reaching the provider, please page the [facility] (Director on Call) for the Hospitalists listed on amion for assistance.  01-05-2019, 9:41 PM

## 2018-12-18 NOTE — ED Notes (Signed)
This RN acting as Art therapist and asked pt if she would like for me to reach out to any family/friends. Pt declined and states she is able to keep them informed.

## 2018-12-18 NOTE — ED Provider Notes (Signed)
[facility]   CSN: [id number] Arrival date & time: 2018-08-19  1721    History   Chief Complaint Chief Complaint  Patient presents with   Abnormal Lab    HPI Shannon Lang is a 73 y.o. female.     The history is provided by the patient, the EMS personnel, medical records, the nursing home and a caregiver.  Abnormal Lab Cough Cough characteristics:  Productive Sputum characteristics:  Bloody Severity:  Moderate Onset quality:  Gradual Duration:  4 days Timing:  Constant Progression:  Unchanged Chronicity:  New Context: sick contacts and upper respiratory infection   Context: not with activity   Relieved by:  Nothing Worsened by:  Nothing Ineffective treatments:  Decongestant, fluids, rest and steam Associated symptoms: chills, fever, shortness of breath and sinus congestion   Associated symptoms: no wheezing   Risk factors: recent infection   Risk factors: no recent travel     Past Medical History:  Diagnosis Date   Acute on chronic respiratory failure with hypoxia (HCC)    Aspiration pneumonia due to gastric secretions (HCC)    Atrial fibrillation with RVR (HCC)    Chronic congestive heart failure with left ventricular diastolic dysfunction (HCC)    COPD, severe (HCC)     Patient Active Problem List   Diagnosis Date Noted   AKI (acute kidney injury) (HCC) 02020-08-1118   Dementia (HCC) 02020-08-1118   DM2 (diabetes mellitus, type 2) (HCC) 02020-08-1118   Fever 02020-08-1118   Hyperkalemia 02020-08-1118   Sacral decubitus ulcer 02020-08-1118   Acute on chronic respiratory failure with hypoxia (HCC)    Aspiration pneumonia due to gastric secretions (HCC)    A-fib (HCC)    Chronic congestive heart failure with left ventricular diastolic dysfunction (HCC)    COPD, severe (HCC)     Past Surgical History:  Procedure Laterality Date   IR GASTROSTOMY TUBE MOD SED  11/21/2018   TRACHEOSTOMY TUBE PLACEMENT  N/A 11/06/2018   Procedure: TRACHEOSTOMY;  Surgeon: Drema HalonNewman, Christopher E, MD;  [facility];  Service: ENT;  Laterality: N/A;     OB History   No obstetric history on file.      Home Medications    Prior to Admission medications   Medication Sig Start Date End Date Taking? Authorizing Provider  ceFEPIme 1 g in sodium chloride 0.9 % 100 mL Inject 1 g into the vein every 12 (twelve) hours. For 7 days 12/14/18  Yes [provider]  chlorhexidine (PERIDEX) 0.12 % solution Use as directed 5 mLs in the mouth or throat See admin instructions. Every shift starting   Yes [provider]  clonazePAM (KLONOPIN) 0.5 MG tablet Place 0.5 mg into feeding tube 2 (two) times daily.   Yes [provider]  fentaNYL (DURAGESIC) 50 MCG/HR Place 1 patch onto the skin every 3 (three) days.   Yes [provider]  insulin glargine (LANTUS) 100 UNIT/ML injection Inject 12 Units into the skin 2 (two) times daily.   Yes [provider]  levothyroxine (SYNTHROID) 50 MCG tablet Place 50 mcg into feeding tube daily.   Yes [provider]  metoprolol tartrate (LOPRESSOR) 25 MG tablet Take 25 mg by mouth 2 (two) times daily.   Yes [provider]  Omega-3 Fatty Acids (FISH OIL) 1000 MG CAPS Take 1,000 mg by mouth daily.   Yes [provider]  pantoprazole (PROTONIX) 40 MG tablet Take 40 mg by mouth daily.   Yes [provider]  polyethylene glycol (MIRALAX / GLYCOLAX) 17 g packet Take 17 g by mouth daily.   Yes [provider]  risperiDONE (RISPERDAL) 0.5 MG tablet Take 0.5 mg by mouth at bedtime.   Yes [provider]  scopolamine (TRANSDERM-SCOP) 1 MG/3DAYS Place 1 patch onto the skin every 3 (three) days.   Yes [provider]  vancomycin 1,000 mg in sodium chloride 0.9 % 250 mL Inject 1,000 mg into the vein daily. For 7 days 12/15/18  Yes [provider]    Family History History reviewed. No  pertinent family history.  Social History Social History   Tobacco Use   Smoking status: Unknown If Ever Smoked  Substance Use Topics   Alcohol use: Not Currently   Drug use: Never     Allergies   Patient has no known allergies.   Review of Systems Review of Systems  Constitutional: Positive for chills and fever.  Respiratory: Positive for cough and shortness of breath. Negative for wheezing.   All other systems reviewed and are negative.    Physical Exam Updated Vital Signs BP (!) 104/58    Pulse 61    Temp (!) 96.5 F (35.8 C) (Axillary)    Resp 20    LMP  (LMP Unknown)    SpO2 96%   Physical Exam Vitals signs and nursing note reviewed.  Constitutional:      Appearance: She is well-developed. She is ill-appearing.  HENT:     Head: Normocephalic and atraumatic.     Mouth/Throat:     Mouth: Mucous membranes are dry.  Eyes:     Conjunctiva/sclera: Conjunctivae normal.  Neck:     Musculoskeletal: Neck supple.     Comments: Tracheostomy in place midneck No obvious bloody, purulent secretions appreciated Cardiovascular:     Rate and Rhythm: Normal rate.  Pulmonary:     Breath sounds: Rhonchi present.     Comments: Coarse rhonchi throughout Abdominal:     Palpations: Abdomen is soft.  Musculoskeletal:     Comments: Trace bilateral lower extremity pitting edema  Skin:    General: Skin is warm and dry.  Neurological:     Mental Status: She is alert.     Comments: Patient nonverbal, communicates with shaking head yes or no  Intermittent rigors throughout exam  Patient variably cooperative during exam, alert and interactive but will not follow all commands        ED Treatments / Results  Labs (all labs ordered are listed, but only abnormal results are displayed) Labs Reviewed  COMPREHENSIVE METABOLIC PANEL - Abnormal; Notable for the following components:      Result Value   Potassium 6.5 (*)    Glucose, Bld 354 (*)    BUN 161 (*)    Creatinine,  Ser 2.28 (*)    Albumin 1.7 (*)    Alkaline Phosphatase 131 (*)    GFR calc non Af Amer 21 (*)    GFR calc Af Amer 24 (*)    All other components within normal limits  CBC WITH DIFFERENTIAL/PLATELET - Abnormal; Notable for the following components:   WBC 10.6 (*)    RBC 3.71 (*)    Hemoglobin 10.2 (*)    HCT 35.4 (*)    MCHC 28.8 (*)    RDW 15.9 (*)    Neutro Abs 8.3 (*)    All other components within normal limits  MAGNESIUM - Abnormal; Notable for the following components:   Magnesium 3.1 (*)  All other components within normal limits  POTASSIUM - Abnormal; Notable for the following components:   Potassium 6.2 (*)    All other components within normal limits  CBG MONITORING, ED - Abnormal; Notable for the following components:   Glucose-Capillary 313 (*)    All other components within normal limits  CBG MONITORING, ED - Abnormal; Notable for the following components:   Glucose-Capillary 266 (*)    All other components within normal limits  SARS CORONAVIRUS 2  CULTURE, BLOOD (ROUTINE X 2)  CULTURE, BLOOD (ROUTINE X 2)  EXPECTORATED SPUTUM ASSESSMENT W REFEX TO RESP CULTURE  PROTIME-INR  APTT  PHOSPHORUS  LACTIC ACID, PLASMA  VANCOMYCIN, RANDOM  LACTIC ACID, PLASMA  URINALYSIS, ROUTINE W REFLEX MICROSCOPIC  CBC  HIV ANTIBODY (ROUTINE TESTING W REFLEX)  RENAL FUNCTION PANEL  URINALYSIS, COMPLETE (UACMP) WITH MICROSCOPIC  TSH    EKG EKG Interpretation  Date/Time:  Thursday December 18 2018 17:31:45 EDT Ventricular Rate:  59 PR Interval:    QRS Duration: 115 QT Interval:  423 QTC Calculation: 419 R Axis:   -52 Text Interpretation:  gooSinus or ectopic atrial rhythm Short PR interval LAD, consider left anterior fascicular block Left ventricular hypertrophy No acute changes No old tracing to compare Confirmed by Derwood Kaplananavati, Ankit [phone]([id number]) on 12/12/2018 5:52:16 PM   Radiology Dg Chest Port 1 View  Result Date: 12/17/2018 CLINICAL DATA:  Hemoptysis. EXAM: PORTABLE  CHEST 1 VIEW COMPARISON:  Radiograph of Nov 17, 2018. FINDINGS: Stable cardiomegaly. Nasogastric tube has been removed. Tracheostomy tube is unchanged in position. No pneumothorax is noted. Left lung is clear. Stable right basilar atelectasis or infiltrate is noted. Bony thorax is unremarkable. IMPRESSION: Stable right basilar atelectasis or infiltrate is noted. Electronically Signed   By: Lupita RaiderJames  Green Jr M.D.   On: 01/04/2019 18:41    Procedures Procedures (including critical care time)  Medications Ordered in ED Medications  0.9 %  sodium chloride infusion (has no administration in time range)  insulin aspart (novoLOG) injection 0-9 Units (has no administration in time range)  fentaNYL (DURAGESIC) 50 MCG/HR 1 patch (has no administration in time range)  clonazePAM (KLONOPIN) tablet 0.5 mg (has no administration in time range)  chlorhexidine (PERIDEX) 0.12 % solution 5 mL (has no administration in time range)  levothyroxine (SYNTHROID) tablet 50 mcg (has no administration in time range)  pantoprazole (PROTONIX) EC tablet 40 mg (has no administration in time range)  polyethylene glycol (MIRALAX / GLYCOLAX) packet 17 g (has no administration in time range)  risperiDONE (RISPERDAL) tablet 0.5 mg (has no administration in time range)  scopolamine (TRANSDERM-SCOP) 1 MG/3DAYS 1.5 mg (has no administration in time range)  acetaminophen (TYLENOL) tablet 650 mg (has no administration in time range)    Or  acetaminophen (TYLENOL) suppository 650 mg (has no administration in time range)  ondansetron (ZOFRAN) tablet 4 mg (has no administration in time range)    Or  ondansetron (ZOFRAN) injection 4 mg (has no administration in time range)  enoxaparin (LOVENOX) injection 40 mg (has no administration in time range)  sodium zirconium cyclosilicate (LOKELMA) packet 10 g (has no administration in time range)  vancomycin variable dose per unstable renal function (pharmacist dosing) (has no administration in  time range)  ceFEPIme (MAXIPIME) 2 g in sodium chloride 0.9 % 100 mL IVPB (has no administration in time range)  albuterol (PROVENTIL,VENTOLIN) solution continuous neb (5 mg/hr Nebulization New Bag/Given 12/16/2018 2258)  insulin aspart (novoLOG) injection 5 Units (5 Units Intravenous Given 12/27/2018 1813)  And  dextrose 50 % solution 50 mL (50 mLs Intravenous Given 2019-01-01 1815)  sodium bicarbonate injection 50 mEq (50 mEq Intravenous Given 01/01/2019 1826)  sodium chloride 0.9 % bolus 2,000 mL (0 mLs Intravenous Stopped 01/01/2019 2034)  ceFEPIme (MAXIPIME) 2 g in sodium chloride 0.9 % 100 mL IVPB (0 g Intravenous Stopped Jan 01, 2019 2034)  sodium zirconium cyclosilicate (LOKELMA) packet 10 g (10 g Oral Given Jan 01, 2019 2044)     Initial Impression / Assessment and Plan / ED Course  I have reviewed the triage vital signs and the nursing notes.  Pertinent labs & imaging results that were available during my care of the patient were reviewed by me and considered in my medical decision making (see chart for details).        Medical Decision Making: Geovana Gebel is a 73 y.o. female who presented to the ED today with cough, lab abnormalities from facility.  Past medical history significant for acute on chronic respiratory failure recently requiring tracheostomy on 11/06/2018, patient also has G-tube placed on 11/21/2018 for enteral access, A. fib, COPD, heart failure, chronic pneumonia currently being treated with cefepime and vancomycin through PICC line right upper extremity Reviewed and confirmed nursing documentation for past medical history, family history, social history.  On my initial exam, the pt was hypotensive, not tachycardic, afebrile, tracheostomy with collar at 5 L nasal cannula O2 sat 98%, tachypneic, coarse rhonchorous breath sounds throughout, nonverbal, intermittently following commands with shaking head yes or no, GCS 4, 1, 6T.  Patient given IV fluid bolus, blood pressures improved to  110s over 50s  Labs at facility showed worsening BUN and creatinine, patient sent for further evaluation care plus new cough Etiology of symptoms and worsening labs unclear however progression of worsening respiratory failure and chronic pneumonia could be contributory  Potassium 6.5, BUN 161, creatinine 2.3, patient temporized with calcium, given hyperkalemia reversal meds. White blood cell count 10.6, hemoglobin 10.2, INR 1.1  Discussed case with nephrology, will evaluate patient for urgent versus emergent hemodialysis given lab changes Discussed case with ICU, ICu team evaluated patient in ED, team believes patient is stable for step down until.  Pt admitted to step down unit.   All radiology and laboratory studies reviewed independently and with my attending physician, agree with reading provided by radiologist unless otherwise noted.  Upon reassessing patient, patient was calm, still requiring 5 L nasal cannula, intermittently following commands, blood pressure 108/58 Based on the above findings, I believe patient requires admission.  Patient admitted. The above care was discussed with and agreed upon by my attending physician. Emergency Department Medication Summary:  Medications  0.9 %  sodium chloride infusion (has no administration in time range)  insulin aspart (novoLOG) injection 0-9 Units (has no administration in time range)  fentaNYL (DURAGESIC) 50 MCG/HR 1 patch (has no administration in time range)  clonazePAM (KLONOPIN) tablet 0.5 mg (has no administration in time range)  chlorhexidine (PERIDEX) 0.12 % solution 5 mL (has no administration in time range)  levothyroxine (SYNTHROID) tablet 50 mcg (has no administration in time range)  pantoprazole (PROTONIX) EC tablet 40 mg (has no administration in time range)  polyethylene glycol (MIRALAX / GLYCOLAX) packet 17 g (has no administration in time range)  risperiDONE (RISPERDAL) tablet 0.5 mg (has no administration in time  range)  scopolamine (TRANSDERM-SCOP) 1 MG/3DAYS 1.5 mg (has no administration in time range)  acetaminophen (TYLENOL) tablet 650 mg (has no administration in time range)    Or  acetaminophen (TYLENOL)  suppository 650 mg (has no administration in time range)  ondansetron (ZOFRAN) tablet 4 mg (has no administration in time range)    Or  ondansetron (ZOFRAN) injection 4 mg (has no administration in time range)  enoxaparin (LOVENOX) injection 40 mg (has no administration in time range)  sodium zirconium cyclosilicate (LOKELMA) packet 10 g (has no administration in time range)  vancomycin variable dose per unstable renal function (pharmacist dosing) (has no administration in time range)  ceFEPIme (MAXIPIME) 2 g in sodium chloride 0.9 % 100 mL IVPB (has no administration in time range)  albuterol (PROVENTIL,VENTOLIN) solution continuous neb (5 mg/hr Nebulization New Bag/Given 12/27/2018 2258)  insulin aspart (novoLOG) injection 5 Units (5 Units Intravenous Given 12/25/2018 1813)    And  dextrose 50 % solution 50 mL (50 mLs Intravenous Given 01/03/2019 1815)  sodium bicarbonate injection 50 mEq (50 mEq Intravenous Given 12/10/2018 1826)  sodium chloride 0.9 % bolus 2,000 mL (0 mLs Intravenous Stopped 12/29/2018 2034)  ceFEPIme (MAXIPIME) 2 g in sodium chloride 0.9 % 100 mL IVPB (0 g Intravenous Stopped 12/22/2018 2034)  sodium zirconium cyclosilicate (LOKELMA) packet 10 g (10 g Oral Given 12/25/2018 2044)     Final Clinical Impressions(s) / ED Diagnoses   Final diagnoses:  AKI (acute kidney injury) [facility](HCC)    ED Discharge Orders    None       Erick Alleyasey, Shakthi Scipio, MD 12/23/2018 2313    Derwood KaplanNanavati, Ankit, MD 12/20/18 1548

## 2018-12-18 NOTE — ED Notes (Signed)
ED TO INPATIENT HANDOFF REPORT  ED Nurse Name and Phone #: [phone]  S Name/Age/Gender Shannon Lang 73 y.o. female Room/Bed: 023C/023C  Code Status   Code Status: Full Code  Home/SNF/Other Skilled nursing facility Patient oriented to: self Is this baseline? Yes   Triage Complete: Triage complete  Chief Complaint [facility]  Triage Note No notes on file   Allergies No Known Allergies  Level of Care/Admitting Diagnosis ED Disposition    ED Disposition Condition Comment   Admit  [facility] [[id number]]  Level of Care: Progressive [102]  Covid Evaluation: Person Under Investigation (PUI)  Isolation Risk Level: High Risk/Airborne (Aerosolizing procedure, nebulizer, intubated/ventilation, CPAP/BiPAP)  Diagnosis: AKI (acute kidney injury) [facility](HCC) [[id number]]) [[id number]]  Admitting Physician: Wyvonnia DuskyGARDNER, JARED M [[id number]]  Attending Physician: Hillary BowGARDNER, JARED M [phone][[id number]]  Estimated length of stay: past midnight tomorrow  Certification:: I certify this patient will need inpatient services for at least 2 midnights  PT Class (Do Not Modify): Inpatient [101]  PT Acc Code (Do Not Modify): Private [1]       B Medical/Surgery History Past Medical History:  Diagnosis Date  . Acute on chronic respiratory failure with hypoxia (HCC)   . Aspiration pneumonia due to gastric secretions (HCC)   . Atrial fibrillation with RVR (HCC)   . Chronic congestive heart failure with left ventricular diastolic dysfunction (HCC)   . COPD, severe (HCC)    Past Surgical History:  Procedure Laterality Date  . IR GASTROSTOMY TUBE MOD SED  11/21/2018  . TRACHEOSTOMY TUBE PLACEMENT N/A 11/06/2018   Procedure: TRACHEOSTOMY;  Surgeon: Drema HalonNewman, Christopher E, MD;  [facility];  Service: ENT;  Laterality: N/A;     A IV Location/Drains/Wounds Patient Lines/Drains/Airways Status   Active Line/Drains/Airways    Name:   Placement date:   Placement time:   Site:   Days:   Peripheral IV 2019/06/21  Left Hand   2019/06/21    1734    Hand   less than 1   NG/OG Tube Nasogastric 16 Fr. Right nare   11/06/18    1210    Right nare   42   Gastrostomy/Enterostomy Gastrostomy 18 Fr. LUQ   11/21/18    1605    LUQ   27   Incision (Closed) 11/06/18 Neck Other (Comment)   11/06/18    1158     42   Tracheostomy Portex 7 mm Cuffed   2019/06/21    1810    7 mm   less than 1          Intake/Output Last 24 hours  Intake/Output Summary (Last 24 hours) at July 04, 2019 2105 Last data filed at July 04, 2019 2034 Gross per 24 hour  Intake 1000 ml  Output -  Net 1000 ml    Labs/Imaging Results for orders placed or performed during the hospital encounter of 2019/06/21 (from the past 48 hour(s))  CBG monitoring, ED     Status: Abnormal   Collection Time: 2019/06/21  5:58 PM  Result Value Ref Range   Glucose-Capillary 313 (H) 70 - 99 mg/dL   Comment 1 Notify RN    Comment 2 Document in Chart   Comprehensive metabolic panel     Status: Abnormal   Collection Time: 2019/06/21  6:34 PM  Result Value Ref Range   Sodium 143 135 - 145 mmol/L   Potassium 6.5 (HH) 3.5 - 5.1 mmol/L    Comment: CRITICAL RESULT CALLED TO, READ BACK BY AND VERIFIED WITH: PBobetta Lime. Jaszmine Navejas,RN  1939 12/24/2018 CLARK,S    Chloride 111 98 - 111 mmol/L   CO2 23 22 - 32 mmol/L   Glucose, Bld 354 (H) 70 - 99 mg/dL   BUN 161161 (H) 8 - 23 mg/dL   Creatinine, Ser 0.962.28 (H) 0.44 - 1.00 mg/dL   Calcium 04.510.2 8.9 - 40.910.3 mg/dL   Total Protein 7.0 6.5 - 8.1 g/dL   Albumin 1.7 (L) 3.5 - 5.0 g/dL   AST 32 15 - 41 U/L   ALT 43 0 - 44 U/L   Alkaline Phosphatase 131 (H) 38 - 126 U/L   Total Bilirubin 0.3 0.3 - 1.2 mg/dL   GFR calc non Af Amer 21 (L) >60 mL/min   GFR calc Af Amer 24 (L) >60 mL/min   Anion gap 9 5 - 15    Comment: Performed at [facility], [address] [place], [place] [address]  CBC with Differential     Status: Abnormal   Collection Time: 01/04/2019  6:34 PM  Result Value Ref Range   WBC 10.6 (H) 4.0 - 10.5 K/uL   RBC 3.71 (L) 3.87 -  5.11 MIL/uL   Hemoglobin 10.2 (L) 12.0 - 15.0 g/dL   HCT 47.835.4 (L) 29.536.0 - 62.146.0 %   MCV 95.4 80.0 - 100.0 fL   MCH 27.5 26.0 - 34.0 pg   MCHC 28.8 (L) 30.0 - 36.0 g/dL   RDW 30.815.9 (H) 65.711.5 - 84.615.5 %   Platelets 217 150 - 400 K/uL   nRBC 0.0 0.0 - 0.2 %   Neutrophils Relative % 78 %   Neutro Abs 8.3 (H) 1.7 - 7.7 K/uL   Lymphocytes Relative 15 %   Lymphs Abs 1.6 0.7 - 4.0 K/uL   Monocytes Relative 5 %   Monocytes Absolute 0.5 0.1 - 1.0 K/uL   Eosinophils Relative 1 %   Eosinophils Absolute 0.1 0.0 - 0.5 K/uL   Basophils Relative 0 %   Basophils Absolute 0.0 0.0 - 0.1 K/uL   Immature Granulocytes 1 %   Abs Immature Granulocytes 0.07 0.00 - 0.07 K/uL    Comment: Performed at [facility], [address] [place], [place] [address]  Protime-INR     Status: None   Collection Time: 01/01/2019  6:34 PM  Result Value Ref Range   Prothrombin Time 14.4 11.4 - 15.2 seconds   INR 1.1 0.8 - 1.2    Comment: (NOTE) INR goal varies based on device and disease states. Performed at [facility], [address] [place], [place] [address]   APTT     Status: None   Collection Time: 01/02/2019  6:34 PM  Result Value Ref Range   aPTT 33 24 - 36 seconds    Comment: Performed at [facility], [address] [place], [place] [address]  Magnesium     Status: Abnormal   Collection Time: 01/01/2019  6:34 PM  Result Value Ref Range   Magnesium 3.1 (H) 1.7 - 2.4 mg/dL    Comment: Performed at [facility], [address] [place], [place] [address]  Phosphorus     Status: None   Collection Time: 12/19/2018  6:34 PM  Result Value Ref Range   Phosphorus 2.9 2.5 - 4.6 mg/dL    Comment: Performed at [facility], [address] [place], [place] [address]   Dg Chest Port 1 View  Result Date: 01/02/2019 CLINICAL DATA:  Hemoptysis. EXAM: PORTABLE CHEST 1 VIEW  COMPARISON:  Radiograph of Nov 17, 2018. FINDINGS: Stable cardiomegaly. Nasogastric tube has been removed. Tracheostomy tube  is unchanged in position. No pneumothorax is noted. Left lung is clear. Stable right basilar atelectasis or infiltrate is noted. Bony thorax is unremarkable. IMPRESSION: Stable right basilar atelectasis or infiltrate is noted. Electronically Signed   By: Lupita RaiderJames  Green Jr M.D.   On: 12/21/2018 18:41    Pending Labs Unresulted Labs (From admission, onward)    Start     Ordered   12/19/18 0500  Basic metabolic panel  Tomorrow morning,   R     12/25/2018 2049   12/19/18 0500  CBC  Tomorrow morning,   R     01/06/2019 2049   12/23/2018 2102  Expectorated sputum assessment w rflx to resp cult  Once,   R     01/03/2019 2103   12/08/2018 2056  HIV antibody (Routine Testing)  Once,   STAT     12/16/2018 2103   12/08/2018 2054  Vancomycin, random  ONCE - STAT,   STAT     01/03/2019 2053   01/02/2019 2051  Potassium  ONCE - STAT,   STAT     12/25/2018 2050   12/21/2018 1945  SARS Coronavirus 2 (CEPHEID- Performed in [facility]), Hosp Order  (Symptomatic Patients Labs with Precautions )  Once,   STAT     12/21/2018 1944   12/25/2018 1804  Lactic acid, plasma  Now then every 2 hours,   STAT     01/03/2019 1803   12/08/2018 1804  Blood Culture (routine x 2)  BLOOD CULTURE X 2,   STAT     12/08/2018 1803   12/29/2018 1804  Urinalysis, Routine w reflex microscopic  ONCE - STAT,   STAT     12/17/2018 1803          Vitals/Pain Today's Vitals   12/31/2018 1936 12/21/2018 1937 12/31/2018 2000 12/24/2018 2030  BP:   127/62 112/62  Pulse: (!) 57 (!) 58 (!) 59 (!) 59  Resp: 19 (!) 26 14 14   Temp:      TempSrc:      SpO2: 99% 100% 100% 99%    Isolation Precautions Droplet and Contact precautions  Medications Medications  calcium gluconate inj 10% (1 g) URGENT USE ONLY! (has no administration in time range)  ceFEPIme (MAXIPIME) 2 g in sodium chloride 0.9 % 100 mL IVPB (has no administration in time range)  0.9 %  sodium chloride infusion (has no administration in time range)  insulin aspart (novoLOG) injection 0-9 Units  (has no administration in time range)  fentaNYL (DURAGESIC) 50 MCG/HR 1 patch (has no administration in time range)  clonazePAM (KLONOPIN) tablet 0.5 mg (has no administration in time range)  chlorhexidine (PERIDEX) 0.12 % solution 5 mL (has no administration in time range)  levothyroxine (SYNTHROID) tablet 50 mcg (has no administration in time range)  pantoprazole (PROTONIX) EC tablet 40 mg (has no administration in time range)  polyethylene glycol (MIRALAX / GLYCOLAX) packet 17 g (has no administration in time range)  risperiDONE (RISPERDAL) tablet 0.5 mg (has no administration in time range)  scopolamine (TRANSDERM-SCOP) 1 MG/3DAYS 1.5 mg (has no administration in time range)  acetaminophen (TYLENOL) tablet 650 mg (has no administration in time range)    Or  acetaminophen (TYLENOL) suppository 650 mg (has no administration in time range)  ondansetron (ZOFRAN) tablet 4 mg (has no administration in time range)    Or  ondansetron (ZOFRAN) injection  4 mg (has no administration in time range)  enoxaparin (LOVENOX) injection 40 mg (has no administration in time range)  insulin aspart (novoLOG) injection 5 Units (5 Units Intravenous Given 12/17/2018 1813)    And  dextrose 50 % solution 50 mL (50 mLs Intravenous Given 01/01/2019 1815)  sodium bicarbonate injection 50 mEq (50 mEq Intravenous Given 12/20/2018 1826)  sodium chloride 0.9 % bolus 2,000 mL (0 mLs Intravenous Stopped 12/24/2018 2034)  ceFEPIme (MAXIPIME) 2 g in sodium chloride 0.9 % 100 mL IVPB (0 g Intravenous Stopped 12/25/2018 2034)  sodium zirconium cyclosilicate (LOKELMA) packet 10 g (10 g Oral Given 12/15/2018 2044)    Mobility non-ambulatory High fall risk   Focused Assessments    R Recommendations: See Admitting Provider Note  Report given to:   Additional Notes:

## 2018-12-18 NOTE — ED Triage Notes (Signed)
Pt here via [facility] from [facility], trach on venturi mask at 35% FiO2, [facility] reports elevated BUN, vaginal bleeding and pneumonia.  Midline present. Pt able to nod head to yes or no questions.  No resp distress noted.

## 2018-12-18 NOTE — Consult Note (Signed)
NAME:  Shannon Lang, MRN:  [id number], DOB:  03/09/1946, LOS: 0 ADMISSION DATE:  12/11/2018, CONSULTATION DATE: 12/31/2018 REFERRING MD: Emergency room, CHIEF COMPLAINT: Prerenal azotemia, fever  Brief History   Patient is a 73 year old with a history of dementia status post tracheostomy after arrest brought to the emergency room evaluation of hypotension and found to have prerenal azotemia  History of present illness   Patient is a 73 year old with a history of dementia status post tracheostomy after arrest brought to the emergency room evaluation of hypotension and found to have prerenal azotemia.  She has a BUN of 161 creatinine of 2.28 suggesting dehydration.  Unable to find any previous lab work.  She also has mild hypomagnesemia and hyperkalemia that is been treated in the emergency room with insulin and glucose.  She received thousand cc bolus with a systolic of 209 diastolic of 70 at this point time.  Not had any urine output in the ER Foley will be placed order given.  White count is 10.6.  Temperature has been as high as 100.  Chest x-ray shows chronic changes in the medial aspect of the right lower lobe. Patient has a recent history of tracheostomy done in April for acute on chronic respiratory failure.  Past Medical History   . Acute on chronic respiratory failure with hypoxia ([place])   . Aspiration pneumonia due to gastric secretions ([place])   . Atrial fibrillation with RVR ([place])   . Chronic congestive heart failure with left ventricular diastolic dysfunction ([place])   . COPD, severe ([place])      Significant Hospital Events   NA  Consults:  NA  Procedures:  NA  Significant Diagnostic Tests:  NA  Micro Data:  NA  Antimicrobials:  NA  Interim history/subjective:  NA  Objective   Blood pressure (!) 122/53, pulse (!) 58, temperature (!) 96.5 F (35.8 C), temperature source Axillary, resp. rate (!) 26, SpO2 100 %.    FiO2 (%):  [35 %] 35 %  No intake or output data  in the 24 hours ending 12/24/2018 2029 There were no vitals filed for this visit.  Examination: General: Obese white female in no acute distress.  She does have intermittent episodes of shaking. HENT: Tracheostomy in place appears to be clean Lungs: Diminished right lower lobe breath sounds otherwise clear Cardiovascular: Regular rate rate and rhythm, has become coarse artifactually with episodes of shaking Abdomen: Benign gastrostomy tube appears to be clean without evidence of infection Extremities: Trace peripheral edema Neuro: Nonfocal patient is nonverbal noncommunicative GU: Normal  Resolved Hospital Problem list   NA  Assessment & Plan:  1.  Prerenal azotemia.  Hypotension responded to fluid bolus.  Hydration is most likely cause for elevated creatinine and BUN and probably contributing to her hyperkalemia.  Hyperkalemia has been treated in the emergency room.  Would recommend repeat BMP.  Also recommend IV hydration and possible gastrostomy free water improved volume status.  2.  Acute on chronic respiratory failure.  Patient currently is on 5 L nasal cannula with oxygen saturation in the high 90s changes on chest x-ray are chronic.   3.  Febrile.  Would recommend UA CandS tracheal aspirate and due to her domicile setting empiric therapy with vancomycin and cefepime other coverage for fastidious gram-negative rods until cultures return.  4.  History of atrial fibrillation currently in sinus rhythm with occasional atrial ectopy.  Would monitor while in-house.  5.  COVID test currently is pending  6.  Dementia  No current need for care in the intensive care unit.  Should her status change please feel free to reconsult critical care medicine service. Best practice:  Diet: Tube feeding Pain/Anxiety/Delirium protocol (if indicated): N/A VAP protocol (if indicated): N/A DVT prophylaxis: Per admitting GI prophylaxis N/A Glucose control: Per admitting Mobility: Bedrest Code  Status: Full Family Communication: No Disposition:   Labs   CBC: Recent Labs  Lab 05-09-19 1834  WBC 10.6*  NEUTROABS 8.3*  HGB 10.2*  HCT 35.4*  MCV 95.4  PLT 217    Basic Metabolic Panel: Recent Labs  Lab 05-09-19 1834  NA 143  K 6.5*  CL 111  CO2 23  GLUCOSE 354*  BUN 161*  CREATININE 2.28*  CALCIUM 10.2  MG 3.1*  PHOS 2.9   GFR: CrCl cannot be calculated (Unknown ideal weight.). Recent Labs  Lab 05-09-19 1834  WBC 10.6*    Liver Function Tests: Recent Labs  Lab 05-09-19 1834  AST 32  ALT 43  ALKPHOS 131*  BILITOT 0.3  PROT 7.0  ALBUMIN 1.7*   No results for input(s): LIPASE, AMYLASE in the last 168 hours. No results for input(s): AMMONIA in the last 168 hours.  ABG    Component Value Date/Time   PHART 7.409 11/26/2018 1030   PCO2ART 62.4 (H) 11/26/2018 1030   PO2ART 80.1 (L) 11/26/2018 1030   HCO3 38.7 (H) 11/26/2018 1030   O2SAT 95.6 11/26/2018 1030     Coagulation Profile: Recent Labs  Lab 05-09-19 1834  INR 1.1    Cardiac Enzymes: No results for input(s): CKTOTAL, CKMB, CKMBINDEX, TROPONINI in the last 168 hours.  HbA1C: No results found for: HGBA1C  CBG: Recent Labs  Lab 05-09-19 1758  GLUCAP 313*    Review of Systems:   Unable to obtain due to tracheostomy patient's chronic mention  Past Medical History  She,  has a past medical history of Acute on chronic respiratory failure with hypoxia (HCC), Aspiration pneumonia due to gastric secretions (HCC), Atrial fibrillation with RVR (HCC), Chronic congestive heart failure with left ventricular diastolic dysfunction (HCC), and COPD, severe (HCC).   Surgical History    Past Surgical History:  Procedure Laterality Date  . IR GASTROSTOMY TUBE MOD SED  11/21/2018  . TRACHEOSTOMY TUBE PLACEMENT N/A 11/06/2018   Procedure: TRACHEOSTOMY;  Surgeon: Drema HalonNewman, Christopher E, MD;  [facility];  Service: ENT;  Laterality: N/A;     Social History   reports previous alcohol use.  She reports that she does not use drugs.   Family History   Her family history is not on file.   Allergies No Known Allergies   Home Medications  Prior to Admission medications   Medication Sig Start Date End Date Taking? Authorizing Provider  ceFEPIme 1 g in sodium chloride 0.9 % 100 mL Inject 1 g into the vein every 12 (twelve) hours. For 7 days 12/14/18  Yes [provider]  chlorhexidine (PERIDEX) 0.12 % solution Use as directed 5 mLs in the mouth or throat See admin instructions. Every shift starting   Yes [provider]  clonazePAM (KLONOPIN) 0.5 MG tablet Place 0.5 mg into feeding tube 2 (two) times daily.   Yes [provider]  fentaNYL (DURAGESIC) 50 MCG/HR Place 1 patch onto the skin every 3 (three) days.   Yes [provider]  insulin glargine (LANTUS) 100 UNIT/ML injection Inject 12 Units into the skin 2 (two) times daily.   Yes [provider]  levothyroxine (SYNTHROID) 50 MCG  tablet Place 50 mcg into feeding tube daily.   Yes [provider]  metoprolol tartrate (LOPRESSOR) 25 MG tablet Take 25 mg by mouth 2 (two) times daily.   Yes [provider]  Omega-3 Fatty Acids (FISH OIL) 1000 MG CAPS Take 1,000 mg by mouth daily.   Yes [provider]  pantoprazole (PROTONIX) 40 MG tablet Take 40 mg by mouth daily.   Yes [provider]  polyethylene glycol (MIRALAX / GLYCOLAX) 17 g packet Take 17 g by mouth daily.   Yes [provider]  risperiDONE (RISPERDAL) 0.5 MG tablet Take 0.5 mg by mouth at bedtime.   Yes [provider]  scopolamine (TRANSDERM-SCOP) 1 MG/3DAYS Place 1 patch onto the skin every 3 (three) days.   Yes [provider]  vancomycin 1,000 mg in sodium chloride 0.9 % 250 mL Inject 1,000 mg into the vein daily. For 7 days 12/15/18  Yes [provider]     Critical care time: Spent 35 minutes and chart review, evaluation of patient at the bedside and  planning.

## 2018-12-19 ENCOUNTER — Encounter (HOSPITAL_COMMUNITY): Payer: Self-pay

## 2018-12-19 ENCOUNTER — Inpatient Hospital Stay (HOSPITAL_COMMUNITY): Payer: Medicare Other

## 2018-12-19 ENCOUNTER — Other Ambulatory Visit: Payer: Self-pay

## 2018-12-19 DIAGNOSIS — J449 Chronic obstructive pulmonary disease, unspecified: Secondary | ICD-10-CM

## 2018-12-19 DIAGNOSIS — R0902 Hypoxemia: Secondary | ICD-10-CM

## 2018-12-19 DIAGNOSIS — N179 Acute kidney failure, unspecified: Principal | ICD-10-CM

## 2018-12-19 DIAGNOSIS — J9621 Acute and chronic respiratory failure with hypoxia: Secondary | ICD-10-CM

## 2018-12-19 DIAGNOSIS — I5032 Chronic diastolic (congestive) heart failure: Secondary | ICD-10-CM

## 2018-12-19 LAB — RENAL FUNCTION PANEL
Albumin: 1.7 g/dL — ABNORMAL LOW (ref 3.5–5.0)
Anion gap: 4 — ABNORMAL LOW (ref 5–15)
BUN: 145 mg/dL — ABNORMAL HIGH (ref 8–23)
CO2: 28 mmol/L (ref 22–32)
Calcium: 10 mg/dL (ref 8.9–10.3)
Chloride: 116 mmol/L — ABNORMAL HIGH (ref 98–111)
Creatinine, Ser: 2.19 mg/dL — ABNORMAL HIGH (ref 0.44–1.00)
GFR calc Af Amer: 26 mL/min — ABNORMAL LOW (ref >60)
GFR calc non Af Amer: 22 mL/min — ABNORMAL LOW (ref >60)
Glucose, Bld: 153 mg/dL — ABNORMAL HIGH (ref 70–99)
Phosphorus: 3.1 mg/dL (ref 2.5–4.6)
Potassium: 5.7 mmol/L — ABNORMAL HIGH (ref 3.5–5.1)
Sodium: 148 mmol/L — ABNORMAL HIGH (ref 135–145)

## 2018-12-19 LAB — CBC
HCT: 31.8 % — ABNORMAL LOW (ref 36.0–46.0)
Hemoglobin: 9.1 g/dL — ABNORMAL LOW (ref 12.0–15.0)
MCH: 26.9 pg (ref 26.0–34.0)
MCHC: 28.6 g/dL — ABNORMAL LOW (ref 30.0–36.0)
MCV: 94.1 fL (ref 80.0–100.0)
Platelets: 196 10*3/uL (ref 150–400)
RBC: 3.38 MIL/uL — ABNORMAL LOW (ref 3.87–5.11)
RDW: 15.9 % — ABNORMAL HIGH (ref 11.5–15.5)
WBC: 9.7 10*3/uL (ref 4.0–10.5)
nRBC: 0 % (ref 0.0–0.2)

## 2018-12-19 LAB — ECHOCARDIOGRAM COMPLETE
Height: 65 in
Weight: 3329.83 oz

## 2018-12-19 LAB — GLUCOSE, CAPILLARY
Glucose-Capillary: 126 mg/dL — ABNORMAL HIGH (ref 70–99)
Glucose-Capillary: 127 mg/dL — ABNORMAL HIGH (ref 70–99)
Glucose-Capillary: 143 mg/dL — ABNORMAL HIGH (ref 70–99)
Glucose-Capillary: 152 mg/dL — ABNORMAL HIGH (ref 70–99)
Glucose-Capillary: 161 mg/dL — ABNORMAL HIGH (ref 70–99)
Glucose-Capillary: 225 mg/dL — ABNORMAL HIGH (ref 70–99)
Glucose-Capillary: 254 mg/dL — ABNORMAL HIGH (ref 70–99)

## 2018-12-19 LAB — URINALYSIS, ROUTINE W REFLEX MICROSCOPIC

## 2018-12-19 LAB — EXPECTORATED SPUTUM ASSESSMENT W GRAM STAIN, RFLX TO RESP C

## 2018-12-19 LAB — URINALYSIS, MICROSCOPIC (REFLEX)
RBC / HPF: >50 RBC/hpf (ref 0–5)
WBC, UA: >50 WBC/hpf (ref 0–5)

## 2018-12-19 LAB — MRSA PCR SCREENING: MRSA by PCR: POSITIVE — AB

## 2018-12-19 LAB — LACTIC ACID, PLASMA: Lactic Acid, Venous: 1.1 mmol/L (ref 0.5–1.9)

## 2018-12-19 LAB — MAGNESIUM
Magnesium: 3.1 mg/dL — ABNORMAL HIGH (ref 1.7–2.4)
Magnesium: 2.7 mg/dL — ABNORMAL HIGH (ref 1.7–2.4)

## 2018-12-19 LAB — STREP PNEUMONIAE URINARY ANTIGEN: Strep Pneumo Urinary Antigen: NEGATIVE

## 2018-12-19 LAB — HIV ANTIBODY (ROUTINE TESTING W REFLEX): HIV Screen 4th Generation wRfx: NONREACTIVE

## 2018-12-19 MED ORDER — STERILE WATER FOR INJECTION IV SOLN
INTRAVENOUS | Status: DC
  Administered 2018-12-19: 15:00:00 via INTRAVENOUS
  Filled 2018-12-19 (×3): qty 950

## 2018-12-19 MED ORDER — ORAL CARE MOUTH RINSE
15.0000 mL | Freq: Two times a day (BID) | OROMUCOSAL | Status: DC
  Administered 2018-12-19 – 2018-12-26 (×15): 15 mL via OROMUCOSAL

## 2018-12-19 MED ORDER — FREE WATER: 200.0000 mL | Freq: Three times a day (TID) | Status: DC

## 2018-12-19 MED ORDER — OSMOLITE 1.5 CAL PO LIQD
1000.0000 mL | ORAL | Status: DC
  Administered 2018-12-19 – 2018-12-24 (×5): 1000 mL
  Filled 2018-12-19 (×8): qty 1000

## 2018-12-19 MED ORDER — COLLAGENASE 250 UNIT/GM EX OINT
TOPICAL_OINTMENT | Freq: Every day | CUTANEOUS | Status: DC
  Administered 2018-12-19 – 2018-12-25 (×7): via TOPICAL
  Filled 2018-12-19: qty 30

## 2018-12-19 MED ORDER — PERFLUTREN LIPID MICROSPHERE
1.0000 mL | INTRAVENOUS | Status: AC | PRN
  Administered 2018-12-19: 4 mL via INTRAVENOUS
  Filled 2018-12-19: qty 10

## 2018-12-19 MED ORDER — SODIUM CHLORIDE 0.45 % IV SOLN
INTRAVENOUS | Status: DC
  Administered 2018-12-19 – 2018-12-26 (×7): via INTRAVENOUS

## 2018-12-19 MED ORDER — RISPERIDONE 0.5 MG PO TABS
0.5000 mg | ORAL_TABLET | Freq: Every day | ORAL | Status: DC
  Administered 2018-12-19 – 2018-12-25 (×8): 0.5 mg
  Filled 2018-12-19 (×9): qty 1

## 2018-12-19 MED ORDER — SODIUM ZIRCONIUM CYCLOSILICATE 10 G PO PACK
10.0000 g | PACK | Freq: Three times a day (TID) | ORAL | Status: DC
  Administered 2018-12-19 (×3): 10 g via ORAL
  Filled 2018-12-19 (×4): qty 1

## 2018-12-19 MED ORDER — FREE WATER
200.0000 mL | Freq: Four times a day (QID) | Status: DC
  Administered 2018-12-19 – 2018-12-20 (×4): 200 mL

## 2018-12-19 MED ORDER — SODIUM CHLORIDE 0.45 % IV BOLUS
500.0000 mL | Freq: Once | INTRAVENOUS | Status: AC
  Administered 2018-12-19: 500 mL via INTRAVENOUS

## 2018-12-19 MED ORDER — ACETAMINOPHEN 650 MG RE SUPP: 650.0000 mg | Freq: Four times a day (QID) | RECTAL | Status: DC | PRN

## 2018-12-19 MED ORDER — ACETAMINOPHEN 160 MG/5ML PO SOLN: 650.0000 mg | Freq: Four times a day (QID) | ORAL | Status: DC | PRN

## 2018-12-19 MED ORDER — POLYETHYLENE GLYCOL 3350 17 G PO PACK
17.0000 g | PACK | Freq: Every day | ORAL | Status: DC
  Administered 2018-12-19 – 2018-12-24 (×5): 17 g
  Filled 2018-12-19 (×6): qty 1

## 2018-12-19 MED ORDER — ONDANSETRON HCL 4 MG PO TABS: 4.0000 mg | ORAL_TABLET | Freq: Four times a day (QID) | ORAL | Status: DC | PRN

## 2018-12-19 MED ORDER — PANTOPRAZOLE SODIUM 40 MG PO PACK
40.0000 mg | PACK | Freq: Every day | ORAL | Status: DC
  Administered 2018-12-19 – 2018-12-26 (×8): 40 mg
  Filled 2018-12-19 (×8): qty 20

## 2018-12-19 MED ORDER — ONDANSETRON HCL 4 MG/2ML IJ SOLN: 4.0000 mg | Freq: Four times a day (QID) | INTRAMUSCULAR | Status: DC | PRN

## 2018-12-19 NOTE — Consult Note (Addendum)
NAME:  Shannon NianSheila Lang, MRN:  [id number], DOB:  01/16/1946, LOS: 1 ADMISSION DATE:  12/21/2018, CONSULTATION DATE: 12/14/2018 REFERRING MD: Emergency room, CHIEF COMPLAINT: Prerenal azotemia, fever  Brief History   Patient is a 73 year old with a history of dementia status post tracheostomy after arrest brought to the emergency room evaluation of hypotension and found to have prerenal azotemia  History of present illness   Patient is a 73 year old with a history of dementia status post tracheostomy after arrest brought to the emergency room evaluation of hypotension and found to have prerenal azotemia.  She has a BUN of 161 creatinine of 2.28 suggesting dehydration.  Unable to find any previous lab work.  She also has mild hypomagnesemia and hyperkalemia that is been treated in the emergency room with insulin and glucose.  She received thousand cc bolus with a systolic of 123 diastolic of 70 at this point time.  Not had any urine output in the ER Foley will be placed order given.  White count is 10.6.  Temperature has been as high as 100.  Chest x-ray shows chronic changes in the medial aspect of the right lower lobe. Patient has a recent history of tracheostomy done in April for acute on chronic respiratory failure.  Past Medical History   . Acute on chronic respiratory failure with hypoxia (HCC)   . Aspiration pneumonia due to gastric secretions (HCC)   . Atrial fibrillation with RVR (HCC)   . Chronic congestive heart failure with left ventricular diastolic dysfunction (HCC)   . COPD, severe (HCC)      Significant Hospital Events   NA  Consults:  6/11 PCCM 6/11 Renal  Procedures:  Trach placed previous admission 11/06/2018 PEG placed previous admission 11/21/2018  Significant Diagnostic Tests:  NA  Micro Data:  6/11 Blood>> 6/11 SARS Coronavirus 2>> Negative 6/12 Tracheal Aspirate>>  Antimicrobials:  Vanc 6/11>> Cefepime 6/11>>   Interim history/subjective:  + 880 cc  since admission T Max 98.4, WBC 9.7 375 cc UO last 24 Potassium remains elevated at 5.7 Creatinine 2.19 from 2.28 BP soft, MAP < 65  Objective   Blood pressure (!) 91/44, pulse (!) 57, temperature 97.7 F (36.5 C), temperature source Axillary, resp. rate 18, height 5\' 5"  (1.651 m), weight 94.4 kg, SpO2 100 %.    FiO2 (%):  [35 %] 35 %   Intake/Output Summary (Last 24 hours) at 12/19/2018 0901 Last data filed at 12/19/2018 [id number] Gross per 24 hour  Intake 1256.38 ml  Output 375 ml  Net 881.38 ml   Filed Weights   12/19/18 0000 12/19/18 0030  Weight: 113.9 kg 94.4 kg    Examination: General: Obese white female in no acute distress.  She does have intermittent episodes of tremor  with stimulation. HENT: Tracheostomy in place appears to be clean and secure, No LAD, thick neck Lungs: Bilateral chest excursion, Diminished throughout, clear Cardiovascular:S1, S2, RRR No RMG, SB at 59 per tele Abdomen: Obese, NT,  ND, BS diminished, PEG site CDI, no drainage noted Extremities: Trace peripheral edema Neuro: Nonfocal patient is nonverbal non communicative, opens eyes to stimulation, does not follow commands GU: To external wicking device, clear amber urine  Resolved Hospital Problem list   NA  Assessment & Plan:  Acute on chronic respiratory failure.   CXR 6/11>>Stable right basilar atelectasis vs infiltrate  WBC  9.7, afebrile Patient currently is on 8 L nasal cannula with oxygen saturation  Is 99% Shannon Lang is secure and intact Site is clean Plan Wean  FiO2 for sats > 94% Trend CXR  ID Send Tracheal aspirate Follow WBC/ Fever curve Follow Micro ABX per primary team Will add urine for legionella and Strep antigen  Prerenal azotemia.   Creatinine starting to down trend Potassium [facility] to [facility], 6.5>> 5.7 on 6/12 Hypernatremia Hypermag Plan Trend BMET Replete electrolytes as needed Trend mag Consider addition of free water per PEG Strict I&O Avoid nephrotoxic  medications Maintain renal perfusion Consider additional fluid bolus   BP remains soft with MAP of 59 Hypotension did respond to fluid bolus previously Fluids currently at 125/ hr Plan Consider additional fluid bolus MAP goal of > 65  GI Plan Consider resuming tube feeds  No current need for care in the intensive care unit.   Should her status change please feel free to reconsult critical care medicine service. Best practice:  Diet: Resume Tube feeding Pain/Anxiety/Delirium protocol (if indicated): N/A VAP protocol (if indicated): N/A DVT prophylaxis: Per admitting GI prophylaxis Protonix Glucose control: Per admitting Mobility: Bedrest Code Status: Full Family Communication: No Disposition: Progressive care  Labs   CBC: Recent Labs  Lab 12/19/2018 1834 12/19/18 0742  WBC 10.6* 9.7  NEUTROABS 8.3*  --   HGB 10.2* 9.1*  HCT 35.4* 31.8*  MCV 95.4 94.1  PLT 217 196    Basic Metabolic Panel: Recent Labs  Lab 01/02/2019 1834 12/23/2018 2115 12/19/18 0742  NA 143  --  148*  K 6.5* 6.2* 5.7*  CL 111  --  116*  CO2 23  --  28  GLUCOSE 354*  --  153*  BUN 161*  --  145*  CREATININE 2.28*  --  2.19*  CALCIUM 10.2  --  10.0  MG 3.1*  --   --   PHOS 2.9  --  3.1   GFR: Estimated Creatinine Clearance: 27.2 mL/min (A) (by C-G formula based on SCr of 2.19 mg/dL (H)). Recent Labs  Lab 12/15/2018 1834 12/10/2018 2125 12/19/18 0742  WBC 10.6*  --  9.7  LATICACIDVEN  --  1.4 1.1    Liver Function Tests: Recent Labs  Lab 12/20/2018 1834 12/19/18 0742  AST 32  --   ALT 43  --   ALKPHOS 131*  --   BILITOT 0.3  --   PROT 7.0  --   ALBUMIN 1.7* 1.7*   No results for input(s): LIPASE, AMYLASE in the last 168 hours. No results for input(s): AMMONIA in the last 168 hours.  ABG    Component Value Date/Time   PHART 7.409 11/26/2018 1030   PCO2ART 62.4 (H) 11/26/2018 1030   PO2ART 80.1 (L) 11/26/2018 1030   HCO3 38.7 (H) 11/26/2018 1030   O2SAT 95.6 11/26/2018 1030      Coagulation Profile: Recent Labs  Lab 12/28/2018 1834  INR 1.1    Cardiac Enzymes: No results for input(s): CKTOTAL, CKMB, CKMBINDEX, TROPONINI in the last 168 hours.  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   Surgical History    Past Surgical History:  Procedure Laterality Date  . IR GASTROSTOMY TUBE MOD SED  11/21/2018  . TRACHEOSTOMY TUBE PLACEMENT N/A 11/06/2018   Procedure: TRACHEOSTOMY;  Surgeon: Drema HalonNewman, Christopher E, MD;  [facility];  Service: ENT;  Laterality: N/A;     Social History   reports previous alcohol use. She reports that she does not use drugs.   Family History   Her family history is not on file.   Allergies No Known Allergies   Home Medications  Prior to Admission medications   Medication Sig Start Date End Date Taking? Authorizing Provider  ceFEPIme 1 g in sodium chloride 0.9 % 100 mL Inject 1 g into the vein every 12 (twelve) hours. For 7 days 12/14/18  Yes [provider]  chlorhexidine (PERIDEX) 0.12 % solution Use as directed 5 mLs in the mouth or throat See admin instructions. Every shift starting   Yes [provider]  clonazePAM (KLONOPIN) 0.5 MG tablet Place 0.5 mg into feeding tube 2 (two) times daily.   Yes [provider]  fentaNYL (DURAGESIC) 50 MCG/HR Place 1 patch onto the skin every 3 (three) days.   Yes [provider]  insulin glargine (LANTUS) 100 UNIT/ML injection  Inject 12 Units into the skin 2 (two) times daily.   Yes [provider]  levothyroxine (SYNTHROID) 50 MCG tablet Place 50 mcg into feeding tube daily.   Yes [provider]  metoprolol tartrate (LOPRESSOR) 25 MG tablet Take 25 mg by mouth 2 (two) times daily.   Yes [provider]  Omega-3 Fatty Acids (FISH OIL) 1000 MG CAPS Take 1,000 mg by mouth daily.   Yes [provider]  pantoprazole (PROTONIX) 40 MG tablet Take 40 mg by mouth daily.   Yes [provider]  polyethylene glycol (MIRALAX / GLYCOLAX) 17 g packet Take 17 g by mouth daily.   Yes [provider]  risperiDONE (RISPERDAL) 0.5 MG tablet Take 0.5 mg by mouth at bedtime.   Yes [provider]  scopolamine (TRANSDERM-SCOP) 1 MG/3DAYS Place 1 patch onto the skin every 3 (three) days.   Yes [provider]  vancomycin 1,000 mg in sodium chloride 0.9 % 250 mL Inject 1,000 mg into the vein daily. For 7 days 12/15/18  Yes [provider]    Bevelyn NgoSarah F. Groce, AGACNP-BC [facility] Pager # [phone] After 4 pm please call [phone] 12/19/2018 9:02 AM  Attending Note:  73 year old female with dementia s/p trach placement after a cardiac arrest [facility] with dehydration induced renal failure and increased O2 demand.  No events overnight.  No new complaints.  On exam, she is arousable but not following command with coarse BS diffusely.  I reviewed CXR myself, trach is in a good position.  Discussed by PCCM-NP.  Hypoxemia:  - Titrate O2 for sat of 88-92%  - May need to consider IPPB with nebs to assist with atelectasis  ?HCAP:  - F/u on cultures  - Cefepime  - Vanc  - Recheck procal  Trach status:  - Maintain current trach size and type  - No downsize or change in type given respiratory demand  GOC:  - Would recommend involvement of palliative care  Acute renal failure: due to dehydration  - IVF  resuscitation  Hyperkalemia:  - Recheck BMET  PCCM will follow for trach management  Patient seen and examined, agree with above note.  I dictated the care and orders written for this patient  under my direction.  Rush Farmer, [place]

## 2018-12-19 NOTE — Consult Note (Addendum)
[facility] Nurse wound consult note Patient receiving care in [place]. At the current time, the FYIS box contains the following statement:  "POSSIBLE NOVEL CORONAVIRUS (2019-NCOV)". Due to the nature of this patient's suspected COVID-19 and in keeping with efforts to prevent the spread of infection and to conserve personal protective equipment, a physical exam was not personally performed. A chart review of other providers notes was performed.  Exam details from prior documentation were reviewed specifically.  Photos are not available for the areas of concern. Reason for Consult: "sacral decubitus" Wound type: Per primary RN flowsheet documentation, this is an unstageable PI Pressure Injury POA: Yes Measurements, Wound bed,Drainage (amount, consistency, odor),Periwound: to be provided by the primary RN Dressing procedure/placement/frequency: Apply Santyl to sacrum in a nickel thick layer. Cover with a saline moistened gauze, then dry gauze or ABD pad.  Change daily. Apply a foam dressing to the DTPI on the right buttock. Change every 3 days and prn. Monitor the wound area(s) for worsening of condition such as: Signs/symptoms of infection,  Increase in size,  Development of or worsening of odor, Development of pain, or increased pain at the affected locations.  Notify the medical team if any of these develop.  Thank you for the consult. [facility] nurse will not follow at this time.  Please re-consult the [facility] team if needed.  Val Riles, RN, MSN, CWOCN, CNS-BC, pager [phone]

## 2018-12-19 NOTE — Progress Notes (Signed)
[facility] Progress Note    Assessment/ Plan:   73 year old lady with a history of dementia status post tracheostomy for chronic respiratory failure [facility]  Brought to the emergency room for evaluation of hypotension. H/o COPD, chronic afib, DM. [facility] and required intubation 10/23/2018 with history of cardiac arrest requiring CPR.  She underwent placement of tracheostomy 11/06/2018.  She underwent G-tube placement 11/21/2018 for enteral access.  [facility]. She has a right upper extremity PICC line and has been receiving vancomycin and cefepime.  Rx Lokelma 10g in the ED.   1) Acute kidney injury with progressive decline in renal function likely secondary to sepsis, hypotension and shock.  She has marked azotemia out of proportion to increase in a creatinine.  She has poor protoplasm with tracheostomy G-tube and ventilator dependent respiratory failure.    - Appears volume depleted + ?sepsis (was being treated w/ Abx)  - Being volume resuscitated and renal function has stabilized.  - She is not a candidate for dialysis.  - U/s does not show obstruction. - Will change the HCO3 to sterile water  + 1 amp HCO3 @ 50 and continue 1/2 NS at 125//hr  - Check random Vanco level  2) Hypotension and shock, it does not appear that she has an acute GI bleed although serial hemoglobin should be followed and careful observation for GI blood loss.  2D echo should be considered, blood cultures sent already.    3) Hyperkalemia would continue Lokelma, albuterol with insulin and glucose and calcium gluconate.  IV bicarbonate.    - Lokelma TID + fluids to increase distal Na/K exchange. I would not offer her dialysis.  4) Hypernatremia - unfortunately issue is access to free water Currently rx 200ml q8hr. Free water deficit of 2.5L Will check ongoing free water losses in the urine, incr free water PEG to 200  q6hr and 1/2 NS to 125 + change the HCO3 to D5W + 1 amp HCO3 @ 50 .  5) Sepsis would check blood cultures continue cefepime and check a vancomycin level.  Etiology could be secondary to aspiration pneumonia.  6) Anemia appears to be not an issue at this time we will continue to follow serum hemoglobin  7) Severe COPD with respiratory failure and tracheostomy 8) Congestive heart failure recommend 2D echo  Subjective:   Nonverbal.    Objective:   BP (!) 86/41 (BP Location: Left Arm)   Pulse 61   Temp 97.7 F (36.5 C) (Axillary)   Resp 15   Ht 5\' 5"  (1.651 m)   Wt 94.4 kg   LMP  (LMP Unknown)   SpO2 98%   BMI 34.63 kg/m   Intake/Output Summary (Last 24 hours) at 12/19/2018 1225 Last data filed at 12/19/2018 [id number] Gross per 24 hour  Intake 1256.38 ml  Output 375 ml  Net 881.38 ml   Weight change:   Physical Exam: GEN: NCAT HEENT: no icterus NECK: Trach LUNGS: Rhonchi CV: RRR ABD: PEG  EXT: No lower extremity edema    Imaging: [facility]  Result Date: 01/06/2019 CLINICAL DATA:  Acute kidney injury EXAM: RENAL / URINARY TRACT ULTRASOUND COMPLETE COMPARISON:  CT 11/20/2018 FINDINGS: Right Kidney: Renal measurements: 11.5 x 5.8 x 5.9 cm = volume: 206.1 mL. Echogenicity normal. Renal cortical thinning. No hydronephrosis Left Kidney: Renal measurements: 11.4 x 6.6 x 5.3 cm = volume: 208.8 mL. Cortical echogenicity normal. No hydronephrosis. Renal cortical thinning.  Bladder: Bladder is unremarkable. IMPRESSION: Difficult visualization of kidneys due to habitus. Negative for hydronephrosis. Mild renal cortical thinning. Electronically Signed   By: Donavan Foil M.D.   On: January 16, 2019 23:38   Dg Chest Port 1 View  Result Date: 2019-01-16 CLINICAL DATA:  Hemoptysis. EXAM: PORTABLE CHEST 1 VIEW COMPARISON:  Radiograph of Nov 17, 2018. FINDINGS: Stable cardiomegaly. Nasogastric tube has been removed. Tracheostomy tube is unchanged in position. No pneumothorax is noted. Left lung is  clear. Stable right basilar atelectasis or infiltrate is noted. Bony thorax is unremarkable. IMPRESSION: Stable right basilar atelectasis or infiltrate is noted. Electronically Signed   By: Marijo Conception M.D.   On: 16-Jan-2019 18:41    Labs: BMET Recent Labs  Lab Jan 16, 2019 1834 01/16/19 2115 12/19/18 0742  NA 143  --  148*  K 6.5* 6.2* 5.7*  CL 111  --  116*  CO2 23  --  28  GLUCOSE 354*  --  153*  BUN 161*  --  145*  CREATININE 2.28*  --  2.19*  CALCIUM 10.2  --  10.0  PHOS 2.9  --  3.1   CBC Recent Labs  Lab January 16, 2019 1834 12/19/18 0742  WBC 10.6* 9.7  NEUTROABS 8.3*  --   HGB 10.2* 9.1*  HCT 35.4* 31.8*  MCV 95.4 94.1  PLT 217 196    Medications:    . chlorhexidine  5 mL Mouth/Throat BID  . clonazePAM  0.5 mg Per Tube BID  . collagenase   Topical Daily  . enoxaparin (LOVENOX) injection  40 mg Subcutaneous Q24H  . [START ON 12/21/2018] fentaNYL  1 patch Transdermal Q72H  . free water  200 mL Per Tube Q8H  . insulin aspart  0-9 Units Subcutaneous Q4H  . levothyroxine  50 mcg Per Tube Daily  . mouth rinse  15 mL Mouth Rinse q12n4p  . pantoprazole sodium  40 mg Per Tube Daily  . polyethylene glycol  17 g Per Tube Daily  . risperiDONE  0.5 mg Per Tube QHS  . [START ON 12/21/2018] scopolamine  1 patch Transdermal Q72H  . sodium zirconium cyclosilicate  10 g Oral TID  . vancomycin variable dose per unstable renal function (pharmacist dosing)   Does not apply See admin instructions      Otelia Santee, MD 12/19/2018, 12:25 PM

## 2018-12-19 NOTE — Progress Notes (Signed)
  Echocardiogram 2D Echocardiogram with Definity has been performed.  Shannon Lang 12/19/2018, 2:07 PM

## 2018-12-19 NOTE — Plan of Care (Signed)

## 2018-12-19 NOTE — Progress Notes (Addendum)
PROGRESS NOTE    Shannon Lang  ZHY:[id number] DOB: 11/13/1945 DOA: 01/08/2019 PCP: System, Pcp Not In   Brief Narrative:  HPI on 01/08/19 by Dr. Jennette Kettle Shannon Lang is a 73 y.o. female with medical history significant of dementia, COPD, Respiratory failure requiring intubation on 4/16, also apparently cardiac arrest requiring CPR.  Patient s/p tracheostomy (4/30) and PEG placement (5/15).  Patient resides at [facility] since that time.  Patient has been having fever, increasing O2 [facility].  Was apparently started on cefepime and vanc there recently.  Today apparently also had AKF and hypotension which prompted them to send her in to the ED.  Assessment & Plan   Acute kidney injury with hyperkalemia -Creatinine on admission 2.28, baseline unknown. Currently 2.19 -Nephrology consulted and appreciated, feels patient is not a candidate for long-term dialysis.  Also feels that her creatinine and GFR may be a lot worse than the calculated GFR due to poor muscle mass.  Patient's azotemia is out of proportion to her increase in creatinine. -Renal ultrasound: Difficult visualization of kidneys due to habitus.  Negative for hydronephrosis.  Mild renal cortical thinning. -Continue IV fluids -Given low, for hyperkalemia along with albuterol, insulin and glucose, calcium gluconate, IV bicarb -Continue to monitor BMP  Acute on chronic respiratory failure with hypoxia with trach -patient with tracheostomy  -PCCM consulted and appreciated -currently on 8L [place]  RLL, suspect HCAP -Supposedly per PCCM note, patient had fever of 100, however patient was mildly hypothermic with of 96.23F -CXR shows stable right basilar atelectasis vs infiltrate -no leukocytosis, fever, or tachycarida noted -lactic acid normal -Continue vancomycin and cefepime -PCCM added urine strep pneumoniae and legionella antigens -Pending respiratory culture -COVID negative  Hypernatremia/hyperchloremia  -will change IVF to half normal -PCCM ordered free water  -continue to monitor BMP  PAF -metoprolol currently held -currently in sinus rhythm  Chronic diastolic congestive heart failure -per medical history -Appears euvolemic and compensated -monitor intake/output, daily weights -Echocardiogram ordered  Diabetes mellitus, type 2 -Continue ISS and CBG monitoring  Dementia  -Continue klonopin, risperdal  Sacral decubitus ulcer -stage 3-4 -present on admission -Wound care consulted  Goals of care -patient with recent history of arrest, tracheostomy  -Currently full code -Palliative care consulted and appreciated  DVT Prophylaxis  Lovenox  Code Status: Full  Family Communication: None at bedside  Disposition Plan: Admitted. Continue to monitor, treat poss pneumonia. Pending improvement in renal function. LTAC when stable  Consultants PCCM Nephrology  Palliative care  Procedures  US Renal  Antibiotics   Anti-infectives (From admission, onward)   Start     Dose/Rate Route Frequency Ordered Stop   12/19/18 1900  ceFEPIme (MAXIPIME) 2 g in sodium chloride 0.9 % 100 mL IVPB     2 g 200 mL/hr over 30 Minutes Intravenous Every 24 hours 08-Jan-2019 2217     12/19/18 0800  ceFEPIme (MAXIPIME) 2 g in sodium chloride 0.9 % 100 mL IVPB  Status:  Discontinued     2 g 200 mL/hr over 30 Minutes Intravenous Every 12 hours 01-08-2019 2026 08-Jan-2019 2217   01-08-19 2211  vancomycin variable dose per unstable renal function (pharmacist dosing)      Does not apply See admin instructions 01/08/2019 2211     01/08/19 1845  ceFEPIme (MAXIPIME) 2 g in sodium chloride 0.9 % 100 mL IVPB     2 g 200 mL/hr over 30 Minutes Intravenous  Once 01/08/19 1838 2019-01-08 2034      Subjective:  Shannon Lang seen and examined today.  Patient with dementia. Nonverbal  Objective:   Vitals:   12/19/18 0311 12/19/18 0449 12/19/18 0748 12/19/18 0810  BP: (!) 102/46 (!) 102/46 (!) 91/44   Pulse:  62 61 (!) 59 (!) 57  Resp: 17 16 16 18   Temp: (!) 97.4 F (36.3 C)  97.7 F (36.5 C)   TempSrc: Axillary  Axillary   SpO2: 100% 100% 100% 100%  Weight:      Height:        Intake/Output Summary (Last 24 hours) at 12/19/2018 1030 Last data filed at 12/19/2018 [id number] Gross per 24 hour  Intake 1256.38 ml  Output 375 ml  Net 881.38 ml   Filed Weights   12/19/18 0000 12/19/18 0030  Weight: 113.9 kg 94.4 kg    Exam  General: Well developed, chronically ill appearing, NAD  HEENT: NCAT, mucous membranes moist.   Neck: Trach  Cardiovascular: S1 S2 auscultated, RRR, no murmur  Respiratory: Diminished but clear (anteriorly)  Abdomen: Soft, nontender, nondistended, + bowel sounds, peg tube  Extremities: warm dry without cyanosis clubbing. Trace LE edema  Neuro: Cannot fully assess. Opens eyes to tactile stimuli. Nonverbal. Dementia  Psych: Cannot assess due to nonverbal/noncommunicative   Data Reviewed: I have personally reviewed following labs and imaging studies  CBC: Recent Labs  Lab 2018/10/17 1834 12/19/18 0742  WBC 10.6* 9.7  NEUTROABS 8.3*  --   HGB 10.2* 9.1*  HCT 35.4* 31.8*  MCV 95.4 94.1  PLT 217 196   Basic Metabolic Panel: Recent Labs  Lab 2018/10/17 1834 2018/10/17 2115 12/19/18 0742  NA 143  --  148*  K 6.5* 6.2* 5.7*  CL 111  --  116*  CO2 23  --  28  GLUCOSE 354*  --  153*  BUN 161*  --  145*  CREATININE 2.28*  --  2.19*  CALCIUM 10.2  --  10.0  MG 3.1*  --   --   PHOS 2.9  --  3.1   GFR: Estimated Creatinine Clearance: 27.2 mL/min (A) (by C-G formula based on SCr of 2.19 mg/dL (H)). Liver Function Tests: Recent Labs  Lab 2018/10/17 1834 12/19/18 0742  AST 32  --   ALT 43  --   ALKPHOS 131*  --   BILITOT 0.3  --   PROT 7.0  --   ALBUMIN 1.7* 1.7*   No results for input(s): LIPASE, AMYLASE in the last 168 hours. No results for input(s): AMMONIA in the last 168 hours. Coagulation Profile: Recent Labs  Lab 2018/10/17 1834  INR 1.1    Cardiac Enzymes: No results for input(s): CKTOTAL, CKMB, CKMBINDEX, TROPONINI in the last 168 hours. BNP (last 3 results) No results for input(s): PROBNP in the last 8760 hours. HbA1C: No results for input(s): HGBA1C in the last 72 hours. CBG: Recent Labs  Lab 2018/10/17 1758 2018/10/17 2105 12/19/18 0044 12/19/18 0315 12/19/18 0747  GLUCAP 313* 266* 254* 225* 127*   Lipid Profile: No results for input(s): CHOL, HDL, LDLCALC, TRIG, CHOLHDL, LDLDIRECT in the last 72 hours. Thyroid Function Tests: Recent Labs    2018/10/17 2244  TSH 3.132   Anemia Panel: No results for input(s): VITAMINB12, FOLATE, FERRITIN, TIBC, IRON, RETICCTPCT in the last 72 hours. Urine analysis:    Component Value Date/Time   COLORURINE YELLOW 10/18/2018 1710   APPEARANCEUR HAZY (A) 10/18/2018 1710   LABSPEC 1.017 10/18/2018 1710   PHURINE 6.0 10/18/2018 1710   GLUCOSEU NEGATIVE 10/18/2018 1710  HGBUR NEGATIVE 10/18/2018 1710   BILIRUBINUR NEGATIVE 10/18/2018 1710   KETONESUR 5 (A) 10/18/2018 1710   PROTEINUR 100 (A) 10/18/2018 1710   NITRITE NEGATIVE 10/18/2018 1710   LEUKOCYTESUR NEGATIVE 10/18/2018 1710   Sepsis Labs: @LABRCNTIP (procalcitonin:4,lacticidven:4)  ) Recent Results (from the past 240 hour(s))  Blood Culture (routine x 2)     Status: None (Preliminary result)   Collection Time: 2019-01-12  5:41 PM   Specimen: BLOOD LEFT HAND  Result Value Ref Range Status   Specimen Description BLOOD LEFT HAND  Final   Special Requests   Final    BOTTLES DRAWN AEROBIC AND ANAEROBIC Blood Culture adequate volume   Culture   Final    NO GROWTH < 24 HOURS Performed at [facility], [address] [place], [place] [address]    Report Status PENDING  Incomplete  SARS Coronavirus 2     Status: None   Collection Time: 2019-01-12  8:39 PM  Result Value Ref Range Status   SARS Coronavirus 2 NOT DETECTED NOT DETECTED Final    Comment: (NOTE) SARS-CoV-2 target nucleic acids are NOT DETECTED. The  SARS-CoV-2 RNA is generally detectable in upper and lower respiratory specimens during the acute phase of infection.  Negative  results do not preclude SARS-CoV-2 infection, do not rule out co-infections with other pathogens, and should not be used as the sole basis for treatment or other patient management decisions.  Negative results must be combined with clinical observations, patient history, and epidemiological information. The expected result is Not Detected. Fact Sheet for Patients: http://www.biofiredefense.com/wp-content/uploads/2020/03/BIOFIRE-COVID -19-patients.pdf Fact Sheet for Healthcare Providers: http://www.biofiredefense.com/wp-content/uploads/2020/03/BIOFIRE-COVID -19-hcp.pdf This test is not yet approved or cleared by the [place] FDA and  has been authorized for detection and/or diagnosis of SARS-CoV-2 by FDA under an Emergency Use Authorization (EUA).  This EUA will remain in effec t (meaning this test can be used) for the duration of  the COVID-19 declaration under Section 564(b)(1) of the Act, 21 U.S.C. section 360bbb-3(b)(1), unless the authorization is terminated or revoked sooner. Performed at [facility], [address] [place], [place] [address]   Expectorated sputum assessment w rflx to resp cult     Status: None   Collection Time: 2019-01-12  9:02 PM   Specimen: Tracheal Aspirate; Sputum  Result Value Ref Range Status   Specimen Description TRACHEAL ASPIRATE  Final   Special Requests NONE  Final   Sputum evaluation   Final    THIS SPECIMEN IS ACCEPTABLE FOR SPUTUM CULTURE Performed at [facility], [address] [place], [place] [address]    Report Status 12/19/2018 FINAL  Final  MRSA PCR Screening     Status: Abnormal   Collection Time: 12/19/18  2:28 AM   Specimen: Nasal Mucosa; Nasopharyngeal  Result Value Ref Range Status   MRSA by PCR POSITIVE (A) NEGATIVE Final    Comment:        The GeneXpert MRSA Assay (FDA approved  for NASAL specimens only), is one component of a comprehensive MRSA colonization surveillance program. It is not intended to diagnose MRSA infection nor to guide or monitor treatment for MRSA infections. RESULT CALLED TO, READ BACK BY AND VERIFIED WITH: RN M DEBASTIANI @0632  12/19/18 BY S GEZAHEGN Performed at [facility], [address] [place], [place] [address]       Radiology Studies: [place] Renal  Result Date: June 28, 2019 CLINICAL DATA:  Acute kidney injury EXAM: RENAL / URINARY TRACT ULTRASOUND COMPLETE COMPARISON:  CT 11/20/2018 FINDINGS: Right Kidney: Renal measurements: 11.5 x 5.8 x 5.9 cm = volume: 206.1 mL. Echogenicity normal. Renal cortical thinning. No hydronephrosis Left Kidney: Renal measurements: 11.4 x 6.6 x 5.3 cm = volume: 208.8 mL. Cortical echogenicity normal. No hydronephrosis. Renal cortical thinning. Bladder: Bladder is unremarkable. IMPRESSION: Difficult visualization of kidneys due to habitus. Negative for hydronephrosis. Mild renal cortical thinning. Electronically Signed   By: Jasmine PangKim  Fujinaga M.D.   On: 12/10/2018 23:38   Dg Chest Port 1 View  Result Date: 08-29-18 CLINICAL DATA:  Hemoptysis. EXAM: PORTABLE CHEST 1 VIEW COMPARISON:  Radiograph of Nov 17, 2018. FINDINGS: Stable cardiomegaly. Nasogastric tube has been removed. Tracheostomy tube is unchanged in position. No pneumothorax is noted. Left lung is clear. Stable right basilar atelectasis or infiltrate is noted. Bony thorax is unremarkable. IMPRESSION: Stable right basilar atelectasis or infiltrate is noted. Electronically Signed   By: Lupita RaiderJames  Green Jr M.D.   On: 12/10/2018 18:41     Scheduled Meds: . chlorhexidine  5 mL Mouth/Throat BID  . clonazePAM  0.5 mg Per Tube BID  . collagenase   Topical Daily  . enoxaparin (LOVENOX) injection  40 mg Subcutaneous Q24H  . [START ON 12/21/2018] fentaNYL  1 patch Transdermal Q72H  . free water  200 mL Per Tube Q8H  . insulin aspart  0-9 Units Subcutaneous Q4H   . levothyroxine  50 mcg Per Tube Daily  . mouth rinse  15 mL Mouth Rinse q12n4p  . pantoprazole sodium  40 mg Per Tube Daily  . polyethylene glycol  17 g Per Tube Daily  . risperiDONE  0.5 mg Per Tube QHS  . [START ON 12/21/2018] scopolamine  1 patch Transdermal Q72H  . sodium zirconium cyclosilicate  10 g Oral TID  . vancomycin variable dose per unstable renal function (pharmacist dosing)   Does not apply See admin instructions   Continuous Infusions: . sodium chloride 125 mL/hr at 12/19/18 0300  . ceFEPime (MAXIPIME) IV    . feeding supplement (OSMOLITE 1.5 CAL)    . sodium chloride       LOS: 1 day   Time Spent in minutes   45 minutes  Renn Dirocco D.O. on 12/19/2018 at 10:30 AM  Between 7am to 7pm - Please see pager noted on [email or web address]  After 7pm go to [email or web address]  And look for the night coverage person covering for me after hours  [facility] Office  [phone]

## 2018-12-19 NOTE — Progress Notes (Signed)
Initial Nutrition Assessment  DOCUMENTATION CODES:   Obesity unspecified  INTERVENTION:   -Initiate Osmolite 1.5 @ 20 ml/hr via PEG, advance by 10 ml every 4 hours to goal rate of 50 ml/hr. -Free water recommendation: 200 ml TID (600 ml total) -This regimen provides 1800 kcal, 75g protein and 1514 ml H2O.  NUTRITION DIAGNOSIS:   Increased nutrient needs related to chronic illness, wound healing as evidenced by estimated needs.  GOAL:   Patient will meet greater than or equal to 90% of their needs  MONITOR:   Labs, Weight trends, TF tolerance, Skin, I & O's, GOC  REASON FOR ASSESSMENT:   Consult Enteral/tube feeding initiation and management  ASSESSMENT:   73 year old with a history of dementia status post tracheostomy after arrest brought to the emergency room evaluation of hypotension and found to have prerenal azotemia 4/30: tracheostomy 5/15: PEG placed  **RD working remotely**  Patient with chronic trach and PEG. Pt nonverbal and unable to provide history. No records of what TF regimen patient was on PTA. Question if TF was initiated given dehydration. Tube feeding recommendations to be ordered today. Will advance every 4 hours d/t not knowing if or how much TF was initiated when PEG was placed.  No weight records available in chart at this time. Daily weights were ordered via TF protocol. Per I/O's: +881 ml since admit <24 hour ago.  Medications reviewed.  Labs reviewed:   CBGs: 127-225 Elevated Na, K Phos WNL GFR: 22  NUTRITION - FOCUSED PHYSICAL EXAM:  Unable to perform per department requirements to work remotely.  Diet Order:   Diet Order            Diet NPO time specified  Diet effective now              EDUCATION NEEDS:   Not appropriate for education at this time  Skin:  Skin Assessment: Skin Integrity Issues: Skin Integrity Issues:: DTI, Unstageable DTI: right buttocks Unstageable: sacrum  Last BM:  6/11  Height:   Ht Readings  from Last 1 Encounters:  12/19/18 5\' 5"  (1.651 m)    Weight:   Wt Readings from Last 1 Encounters:  12/19/18 94.4 kg    Ideal Body Weight:  56.8 kg  BMI:  Body mass index is 34.63 kg/m.  Estimated Nutritional Needs:   Kcal:  1800-2000  Protein:  75-85g  Fluid:  1.8L/day   Clayton Bibles, MS, RD, LDN [place] Dietitian Pager: [phone] After Hours Pager: [phone]

## 2018-12-20 ENCOUNTER — Inpatient Hospital Stay (HOSPITAL_COMMUNITY): Payer: Medicare Other

## 2018-12-20 ENCOUNTER — Encounter (HOSPITAL_COMMUNITY): Payer: Self-pay

## 2018-12-20 DIAGNOSIS — J9690 Respiratory failure, unspecified, unspecified whether with hypoxia or hypercapnia: Secondary | ICD-10-CM

## 2018-12-20 DIAGNOSIS — Z515 Encounter for palliative care: Secondary | ICD-10-CM

## 2018-12-20 DIAGNOSIS — Z7189 Other specified counseling: Secondary | ICD-10-CM

## 2018-12-20 LAB — CBC
HCT: 29.4 % — ABNORMAL LOW (ref 36.0–46.0)
Hemoglobin: 8.6 g/dL — ABNORMAL LOW (ref 12.0–15.0)
MCH: 27 pg (ref 26.0–34.0)
MCHC: 29.3 g/dL — ABNORMAL LOW (ref 30.0–36.0)
MCV: 92.2 fL (ref 80.0–100.0)
Platelets: 173 10*3/uL (ref 150–400)
RBC: 3.19 MIL/uL — ABNORMAL LOW (ref 3.87–5.11)
RDW: 16.3 % — ABNORMAL HIGH (ref 11.5–15.5)
WBC: 11 10*3/uL — ABNORMAL HIGH (ref 4.0–10.5)
nRBC: 0 % (ref 0.0–0.2)

## 2018-12-20 LAB — RENAL FUNCTION PANEL
Albumin: 1.5 g/dL — ABNORMAL LOW (ref 3.5–5.0)
Anion gap: 7 (ref 5–15)
BUN: 119 mg/dL — ABNORMAL HIGH (ref 8–23)
CO2: 23 mmol/L (ref 22–32)
Calcium: 8.9 mg/dL (ref 8.9–10.3)
Chloride: 109 mmol/L (ref 98–111)
Creatinine, Ser: 1.92 mg/dL — ABNORMAL HIGH (ref 0.44–1.00)
GFR calc Af Amer: 30 mL/min — ABNORMAL LOW (ref >60)
GFR calc non Af Amer: 26 mL/min — ABNORMAL LOW (ref >60)
Glucose, Bld: 286 mg/dL — ABNORMAL HIGH (ref 70–99)
Phosphorus: 2.6 mg/dL (ref 2.5–4.6)
Potassium: 4.8 mmol/L (ref 3.5–5.1)
Sodium: 139 mmol/L (ref 135–145)

## 2018-12-20 LAB — GLUCOSE, CAPILLARY
Glucose-Capillary: 182 mg/dL — ABNORMAL HIGH (ref 70–99)
Glucose-Capillary: 196 mg/dL — ABNORMAL HIGH (ref 70–99)
Glucose-Capillary: 238 mg/dL — ABNORMAL HIGH (ref 70–99)
Glucose-Capillary: 260 mg/dL — ABNORMAL HIGH (ref 70–99)
Glucose-Capillary: 287 mg/dL — ABNORMAL HIGH (ref 70–99)
Glucose-Capillary: 288 mg/dL — ABNORMAL HIGH (ref 70–99)

## 2018-12-20 LAB — VANCOMYCIN, RANDOM: Vancomycin Rm: 18

## 2018-12-20 LAB — MAGNESIUM
Magnesium: 2.5 mg/dL — ABNORMAL HIGH (ref 1.7–2.4)
Magnesium: 2.3 mg/dL (ref 1.7–2.4)

## 2018-12-20 MED ORDER — SODIUM BICARBONATE 8.4 % IV SOLN
INTRAVENOUS | Status: DC
  Administered 2018-12-21: 01:00:00 via INTRAVENOUS
  Filled 2018-12-20 (×2): qty 950

## 2018-12-20 MED ORDER — SODIUM CHLORIDE 0.9% FLUSH
10.0000 mL | Freq: Two times a day (BID) | INTRAVENOUS | Status: DC
  Administered 2018-12-21 – 2018-12-24 (×7): 10 mL

## 2018-12-20 MED ORDER — SODIUM CHLORIDE 0.9% FLUSH: 10.0000 mL | INTRAVENOUS | Status: DC | PRN

## 2018-12-20 MED ORDER — VANCOMYCIN HCL 10 G IV SOLR
1500.0000 mg | INTRAVENOUS | Status: DC
  Administered 2018-12-20: 1500 mg via INTRAVENOUS
  Filled 2018-12-20 (×2): qty 1500

## 2018-12-20 MED ORDER — FREE WATER
200.0000 mL | Freq: Three times a day (TID) | Status: DC
  Administered 2018-12-20 – 2018-12-24 (×13): 200 mL

## 2018-12-20 MED ORDER — SODIUM ZIRCONIUM CYCLOSILICATE 10 G PO PACK
10.0000 g | PACK | Freq: Every day | ORAL | Status: DC
  Administered 2018-12-20: 10 g via ORAL
  Filled 2018-12-20 (×2): qty 1

## 2018-12-20 NOTE — Progress Notes (Signed)
PROGRESS NOTE    Shannon Lang  NWG:[id number] DOB: 01/22/1946 DOA: 12/17/2018 PCP: System, Pcp Not In   Brief Narrative:  HPI on 12/19/2018 by Dr. Lyda PeroneJared Lang Shannon NianSheila Lang is a 73 y.o. female with medical history significant of dementia, COPD, Respiratory failure requiring intubation on 4/16, also apparently cardiac arrest requiring CPR.  Patient s/p tracheostomy (4/30) and PEG placement (5/15).  Patient resides at [facility] since that time.  Patient has been having fever, increasing O2 requirement at [facility].  Was apparently started on cefepime and vanc there recently.  Today apparently also had AKF and hypotension which prompted them to send her in to the ED.  Interim history Admitted for AKI, acute on chronic resp failure secondary to poss pneumonia. Nephrology, PCCM, palliative care consulted.  Assessment & Plan   Acute kidney injury with hyperkalemia -Creatinine on admission 2.28, baseline unknown. Currently 1.92 -Nephrology consulted and appreciated, feels patient is not a candidate for long-term dialysis.  Also feels that her creatinine and GFR may be a lot worse than the calculated GFR due to poor muscle mass.  Patient's azotemia is out of proportion to her increase in creatinine. -Renal ultrasound: Difficult visualization of kidneys due to habitus.  Negative for hydronephrosis.  Mild renal cortical thinning. -Continue IV fluids -patient was given lokelma for hyperkalemia along with albuterol, insulin and glucose, calcium gluconate, IV bicarb -Continue to monitor BMP  Acute on chronic respiratory failure with hypoxia with trach -patient with tracheostomy  -PCCM consulted and appreciated -currently on 5L [place]  RLL, suspect HCAP -Supposedly per PCCM note, patient had fever of 100, however patient was mildly hypothermic with of 96.30F -CXR shows stable right basilar atelectasis vs infiltrate -no leukocytosis, fever, or tachycarida noted -lactic acid normal -Continue  vancomycin and cefepime -PCCM added urine strep pneumoniae and legionella antigens -urine Strep pneumo Ag negative -Respiratory culture: Rare GNR -COVID negative  Hypernatremia/hyperchloremia -Cchanged IVF to half normal -Sodium improving, down to 139, Chloride down to 109 -PCCM ordered free water  -continue to monitor BMP  PAF -metoprolol currently held -currently in sinus rhythm  Chronic diastolic congestive heart failure -per medical history -Appears euvolemic and compensated -monitor intake/output, daily weights -Echocardiogram EF 55-60%, LV diastolic parameters are indeterminate  Diabetes mellitus, type 2 -Continue ISS and CBG monitoring  Dementia  -Continue klonopin, risperdal  Sacral decubitus ulcer -stage 3-4 -present on admission -Wound care consulted  Goals of care -patient with recent history of arrest, tracheostomy  -Currently full code -Palliative care consulted and appreciated  DVT Prophylaxis  Lovenox  Code Status: Full  Family Communication: None at bedside  Disposition Plan: Admitted. Continue to monitor, treat poss pneumonia. Pending improvement in renal function. LTAC when stable  Consultants PCCM Nephrology  Palliative care  Procedures  US Renal  Antibiotics   Anti-infectives (From admission, onward)   Start     Dose/Rate Route Frequency Ordered Stop   12/20/18 0830  vancomycin (VANCOCIN) 1,500 mg in sodium chloride 0.9 % 500 mL IVPB     1,500 mg 250 mL/hr over 120 Minutes Intravenous Every 48 hours 12/20/18 0827     12/19/18 1900  ceFEPIme (MAXIPIME) 2 g in sodium chloride 0.9 % 100 mL IVPB     2 g 200 mL/hr over 30 Minutes Intravenous Every 24 hours 12/20/2018 2217     12/19/18 0800  ceFEPIme (MAXIPIME) 2 g in sodium chloride 0.9 % 100 mL IVPB  Status:  Discontinued     2 g 200 mL/hr over 30 Minutes Intravenous  Every 12 hours 12/30/2018 2026 12/25/2018 2217   01/02/2019 2211  vancomycin variable dose per unstable renal function  (pharmacist dosing)  Status:  Discontinued      Does not apply See admin instructions 12/27/2018 2211 12/20/18 0827   12/29/2018 1845  ceFEPIme (MAXIPIME) 2 g in sodium chloride 0.9 % 100 mL IVPB     2 g 200 mL/hr over 30 Minutes Intravenous  Once 01/06/2019 1838 12/11/2018 2034      Subjective:   Shannon NianSheila Longnecker seen and examined today.  Patient with dementia. Nonverbal  Objective:   Vitals:   12/20/18 0352 12/20/18 0447 12/20/18 0758 12/20/18 0807  BP: 115/67 115/67 (!) 102/42   Pulse: 70 70 67 63  Resp: 17 18 12 17   Temp: 98.4 F (36.9 C)  98.7 F (37.1 C)   TempSrc: Axillary  Axillary   SpO2: 94% 95% 93% 96%  Weight:      Height:        Intake/Output Summary (Last 24 hours) at 12/20/2018 0947 Last data filed at 12/20/2018 0626 Gross per 24 hour  Intake 3183.66 ml  Output -  Net 3183.66 ml   Filed Weights   12/19/18 0000 12/19/18 0030 12/20/18 0348  Weight: 113.9 kg 94.4 kg 100.2 kg   Exam  General: Well developed, chronically ill appearing, NAD  HEENT: NCAT,  mucous membranes moist.   Neck: Trach   Cardiovascular: S1 S2 auscultated, RRR, no murmur  Respiratory: Diminished but clear (anteriorly)  Abdomen: Soft, obese, nontender, nondistended, + bowel sounds, peg tube  Extremities: warm dry without cyanosis clubbing. Trace LE edema  Neuro: Awake, eyes open, RUE tremor  Data Reviewed: I have personally reviewed following labs and imaging studies  CBC: Recent Labs  Lab 01/03/2019 1834 12/19/18 0742 12/20/18 0637  WBC 10.6* 9.7 11.0*  NEUTROABS 8.3*  --   --   HGB 10.2* 9.1* 8.6*  HCT 35.4* 31.8* 29.4*  MCV 95.4 94.1 92.2  PLT 217 196 173   Basic Metabolic Panel: Recent Labs  Lab 12/15/2018 1834 12/23/2018 2115 12/19/18 0742 12/19/18 1857 12/20/18 0637  NA 143  --  148*  --  139  K 6.5* 6.2* 5.7*  --  4.8  CL 111  --  116*  --  109  CO2 23  --  28  --  23  GLUCOSE 354*  --  153*  --  286*  BUN 161*  --  145*  --  119*  CREATININE 2.28*  --  2.19*   --  1.92*  CALCIUM 10.2  --  10.0  --  8.9  MG 3.1*  --  3.1* 2.7* 2.5*  PHOS 2.9  --  3.1  --  2.6   GFR: Estimated Creatinine Clearance: 32 mL/min (A) (by C-G formula based on SCr of 1.92 mg/dL (H)). Liver Function Tests: Recent Labs  Lab 01/04/2019 1834 12/19/18 0742 12/20/18 0637  AST 32  --   --   ALT 43  --   --   ALKPHOS 131*  --   --   BILITOT 0.3  --   --   PROT 7.0  --   --   ALBUMIN 1.7* 1.7* 1.5*   No results for input(s): LIPASE, AMYLASE in the last 168 hours. No results for input(s): AMMONIA in the last 168 hours. Coagulation Profile: Recent Labs  Lab 12/16/2018 1834  INR 1.1   Cardiac Enzymes: No results for input(s): CKTOTAL, CKMB, CKMBINDEX, TROPONINI in the last 168 hours. BNP (  last 3 results) No results for input(s): PROBNP in the last 8760 hours. HbA1C: No results for input(s): HGBA1C in the last 72 hours. CBG: Recent Labs  Lab 12/19/18 1521 12/19/18 1950 12/19/18 2339 12/20/18 0345 12/20/18 0757  GLUCAP 143* 161* 152* 238* 287*   Lipid Profile: No results for input(s): CHOL, HDL, LDLCALC, TRIG, CHOLHDL, LDLDIRECT in the last 72 hours. Thyroid Function Tests: Recent Labs    2019/01/09 2244  TSH 3.132   Anemia Panel: No results for input(s): VITAMINB12, FOLATE, FERRITIN, TIBC, IRON, RETICCTPCT in the last 72 hours. Urine analysis:    Component Value Date/Time   COLORURINE RED (A) 12/19/2018 2020   APPEARANCEUR TURBID (A) 12/19/2018 2020   LABSPEC NOT DONE 12/19/2018 2020   PHURINE NOT DONE 12/19/2018 2020   GLUCOSEU (A) 12/19/2018 2020    TEST NOT REPORTED DUE TO COLOR INTERFERENCE OF URINE PIGMENT   HGBUR (A) 12/19/2018 2020    TEST NOT REPORTED DUE TO COLOR INTERFERENCE OF URINE PIGMENT   BILIRUBINUR (A) 12/19/2018 2020    TEST NOT REPORTED DUE TO COLOR INTERFERENCE OF URINE PIGMENT   KETONESUR (A) 12/19/2018 2020    TEST NOT REPORTED DUE TO COLOR INTERFERENCE OF URINE PIGMENT   PROTEINUR (A) 12/19/2018 2020    TEST NOT REPORTED DUE  TO COLOR INTERFERENCE OF URINE PIGMENT   NITRITE (A) 12/19/2018 2020    TEST NOT REPORTED DUE TO COLOR INTERFERENCE OF URINE PIGMENT   LEUKOCYTESUR (A) 12/19/2018 2020    TEST NOT REPORTED DUE TO COLOR INTERFERENCE OF URINE PIGMENT   Sepsis Labs: (procalcitonin:4,lacticidven:4)  ) Recent Results (from the past 240 hour(s))  Blood Culture (routine x 2)     Status: None (Preliminary result)   Collection Time: January 09, 2019  5:41 PM   Specimen: BLOOD LEFT HAND  Result Value Ref Range Status   Specimen Description BLOOD LEFT HAND  Final   Special Requests   Final    BOTTLES DRAWN AEROBIC AND ANAEROBIC Blood Culture adequate volume   Culture   Final    NO GROWTH 2 DAYS Performed at [facility], [address], [place], [place] [address]    Report Status PENDING  Incomplete  SARS Coronavirus 2     Status: None   Collection Time: 2019-01-09  8:39 PM  Result Value Ref Range Status   SARS Coronavirus 2 NOT DETECTED NOT DETECTED Final    Comment: (NOTE) SARS-CoV-2 target nucleic acids are NOT DETECTED. The SARS-CoV-2 RNA is generally detectable in upper and lower respiratory specimens during the acute phase of infection.  Negative  results do not preclude SARS-CoV-2 infection, do not rule out co-infections with other pathogens, and should not be used as the sole basis for treatment or other patient management decisions.  Negative results must be combined with clinical observations, patient history, and epidemiological information. The expected result is Not Detected. Fact Sheet for Patients: http://www.biofiredefense.com/wp-content/uploads/2020/03/BIOFIRE-COVID -19-patients.pdf Fact Sheet for Healthcare Providers: http://www.biofiredefense.com/wp-content/uploads/2020/03/BIOFIRE-COVID -19-hcp.pdf This test is not yet approved or cleared by the [place] and  has been authorized for detection and/or diagnosis of SARS-CoV-2 by FDA under an Emergency Use Authorization  (EUA).  This EUA will remain in effec t (meaning this test can be used) for the duration of  the COVID-19 declaration under Section 564(b)(1) of the Act, 21 U.S.C. section 360bbb-3(b)(1), unless the authorization is terminated or revoked sooner. Performed at [facility], [address] [place], [place] [address]   Expectorated sputum assessment w rflx  to resp cult     Status: None   Collection Time: 01/05/2019  9:02 PM   Specimen: Tracheal Aspirate; Sputum  Result Value Ref Range Status   Specimen Description TRACHEAL ASPIRATE  Final   Special Requests NONE  Final   Sputum evaluation   Final    THIS SPECIMEN IS ACCEPTABLE FOR SPUTUM CULTURE Performed at [facility], [address], [place], [place] [address]    Report Status 12/19/2018 FINAL  Final  Culture, respiratory     Status: None (Preliminary result)   Collection Time: January 05, 2019  9:02 PM   Specimen: Tracheal Aspirate  Result Value Ref Range Status   Specimen Description TRACHEAL ASPIRATE  Final   Special Requests NONE Reflexed from [id number]  Final   Gram Stain   Final    ABUNDANT WBC PRESENT, PREDOMINANTLY PMN FEW SQUAMOUS EPITHELIAL CELLS PRESENT RARE GRAM NEGATIVE RODS Performed at [facility], [place] [address] [place], [place] [address]    Culture PENDING  Incomplete   Report Status PENDING  Incomplete  MRSA PCR Screening     Status: Abnormal   Collection Time: 12/19/18  2:28 AM   Specimen: Nasal Mucosa; Nasopharyngeal  Result Value Ref Range Status   MRSA by PCR POSITIVE (A) NEGATIVE Final    Comment:        The GeneXpert MRSA Assay (FDA approved for NASAL specimens only), is one component of a comprehensive MRSA colonization surveillance program. It is not intended to diagnose MRSA infection nor to guide or monitor treatment for MRSA infections. RESULT CALLED TO, READ BACK BY AND VERIFIED WITH: RN M DEBASTIANI @0632  12/19/18 BY S GEZAHEGN Performed at [facility], [address], [place], [place] [address]   Blood Culture (routine x 2)     Status: None (Preliminary result)   Collection Time: 12/19/18  7:42 AM   Specimen: BLOOD  Result Value Ref Range Status   Specimen Description BLOOD LEFT ANTECUBITAL  Final   Special Requests   Final    BOTTLES DRAWN AEROBIC ONLY Blood Culture adequate volume   Culture   Final    NO GROWTH 1 DAY Performed at [facility], [place] [address] [place],  [address]    Report Status PENDING  Incomplete      Radiology Studies: US Renal  Result Date: 2019/01/05 CLINICAL DATA:  Acute kidney injury EXAM: RENAL / URINARY TRACT ULTRASOUND COMPLETE COMPARISON:  CT 11/20/2018 FINDINGS: Right Kidney: Renal measurements: 11.5 x 5.8 x 5.9 cm = volume: 206.1 mL. Echogenicity normal. Renal cortical thinning. No hydronephrosis Left Kidney: Renal measurements: 11.4 x 6.6 x 5.3 cm = volume: 208.8 mL. Cortical echogenicity normal. No hydronephrosis. Renal cortical thinning. Bladder: Bladder is unremarkable. IMPRESSION: Difficult visualization of kidneys due to habitus. Negative for hydronephrosis. Mild renal cortical thinning. Electronically Signed   By: Donavan Foil M.D.   On: 01/05/2019 23:38   Dg Chest Port 1 View  Result Date: 01-05-19 CLINICAL DATA:  Hemoptysis. EXAM: PORTABLE CHEST 1 VIEW COMPARISON:  Radiograph of Nov 17, 2018. FINDINGS: Stable cardiomegaly. Nasogastric tube has been removed. Tracheostomy tube is unchanged in position. No pneumothorax is noted. Left lung is clear. Stable right basilar atelectasis or infiltrate is noted. Bony thorax is unremarkable. IMPRESSION: Stable right basilar atelectasis or infiltrate is noted. Electronically Signed   By: Marijo Conception M.D.   On: 01-05-19 18:41     Scheduled Meds: . chlorhexidine  5 mL Mouth/Throat BID  .  clonazePAM  0.5 mg Per Tube BID  . collagenase   Topical Daily  . enoxaparin (LOVENOX) injection  40 mg Subcutaneous Q24H  . [START ON 12/21/2018] fentaNYL  1 patch  Transdermal Q72H  . free water  200 mL Per Tube Q8H  . insulin aspart  0-9 Units Subcutaneous Q4H  . levothyroxine  50 mcg Per Tube Daily  . mouth rinse  15 mL Mouth Rinse q12n4p  . pantoprazole sodium  40 mg Per Tube Daily  . polyethylene glycol  17 g Per Tube Daily  . risperiDONE  0.5 mg Per Tube QHS  . [START ON 12/21/2018] scopolamine  1 patch Transdermal Q72H  . sodium zirconium cyclosilicate  10 g Oral Daily   Continuous Infusions: . sodium chloride 125 mL/hr at 12/19/18 1231  . ceFEPime (MAXIPIME) IV Stopped (12/20/18 0116)  . feeding supplement (OSMOLITE 1.5 CAL) 50 mL/hr at 12/20/18 0236  .  sodium bicarbonate (isotonic) infusion in sterile water 50 mL/hr at 12/20/18 0626  . vancomycin       LOS: 2 days   Time Spent in minutes   45 minutes  Sabrea Sankey D.O. on 12/20/2018 at 9:47 AM  Between 7am to 7pm - Please see pager noted on [email or web address]  After 7pm go to [email or web address]  And look for the night coverage person covering for me after hours  [facility] Office  [phone]

## 2018-12-20 NOTE — Consult Note (Addendum)
Consultation Note Date: 12/20/2018   Patient Name: Shannon Lang  DOB: 11/26/1945  MRN: [id number]  Age / Sex: 73 y.o., female  PCP: System, Pcp Not In Referring Physician: Edsel PetrinMikhail, Maryann, DO  Reason for Consultation: Establishing goals of care and Psychosocial/spiritual support  HPI/Patient Profile: 73 y.o. female  with past medical history of cardiac arrest in April, 2020, leading to respiratory failure, intubation, (11/06/2018), PEG (11/11/2018), congestive heart failure, diabetes type 2, sacral decubitus, atrial fib, dementia, COPD, admitted on 12/29/2018 with hypotension and worsening kidney function.  Patient has been living at [facility] nursing facility since cardiac arrest in April 2020.  She was admitted on 12/08/2018 after being found to be hypotensive, labs reflecting worsening renal function, pneumonia.  Chest x-ray shows right lower lobe infiltrate  Consult ordered for goals of care specifically to address CODE STATUS  Clinical Assessment and Goals of Care: Patient seen, chart reviewed.  Patient is unable to participate in assessment given trach as well as current clinical condition.  She will only occasionally nod yes or no; mostly has been very lethargic.  Patient is unable to participate in goals of care.  Her healthcare proxy is her daughter, Shannon Lang at [phone].  Did reach out to Marylene Landngela and Marylene Landngela shares a very difficult time secondary to her mother's health but also compounded by not being able to see her since February 2020 because of COVID-19 restrictions.  Patient [facility] [place] well [facility] [place] and ultimately [facility] in [place].  Marylene Landngela is very frustrated not only about being able to see her with a lack of communication with her mother's physicians from [facility].  She begins the conversation by asking me "when is somebody going to do something about her stomach cancer ?"  Apparently her mother ( per her daughter) was diagnosed with stomach cancer and those records are at [facility] [place]   SUMMARY OF RECOMMENDATIONS   Verified that patient is a full code by choice I feel that given the length of time that patient's daughter has not been able to see her mother, that this is impacting the family's ability to make decisions in Ms. Isabell's best interest.  I have reached out to patient's attending as well as charge nurse on 2 W.  At this point parties are in agreement to allow patient's daughter and son-in-law to visit Monday at 2 PM Palliative medicine to confirm visitation on Monday morning and call Marylene Landngela.  Our team will meet Marylene Landngela and her husband, Shannon Lang, and escort them to the unit  Code Status/Advance Care Planning:  Full code  Palliative Prophylaxis:   Aspiration, Bowel Regimen, Delirium Protocol, Eye Care, Frequent Pain Assessment, Oral Care and Turn Reposition  Additional Recommendations (Limitations, Scope, Preferences):  Full Scope Treatment  Psycho-social/Spiritual:   Desire for further Chaplaincy support:no  Additional Recommendations: Referral to Community Resources   Prognosis:   Unable to determine  Discharge Planning: To Be Determined      Primary Diagnoses: Present on Admission: . Acute on chronic respiratory failure with hypoxia (HCC) . COPD, severe (HCC) . A-fib (HCC) . AKI (acute kidney injury) (HCC) . Dementia (HCC) . Fever . Chronic congestive heart failure with left ventricular diastolic dysfunction (HCC) . Hyperkalemia . Sacral decubitus ulcer   I have reviewed the medical record, interviewed the patient and family, and examined the patient. The following aspects are pertinent.  Past Medical History:  Diagnosis Date  . Acute on chronic respiratory  failure with hypoxia (HCC)   . Aspiration pneumonia due to gastric secretions (HCC)   . Atrial fibrillation with RVR (HCC)   . Chronic congestive heart failure with left ventricular diastolic dysfunction (HCC)   . COPD, severe (HCC)    Social History   Socioeconomic History  . Marital status: Single    Spouse name: Not on file  . Number of children: Not on file  . Years of education: Not on file  . Highest education level: Not on file  Occupational History  . Not on file  Social Needs  . Financial resource strain: Not on file  . Food insecurity    Worry: Not on file    Inability: Not on file  . Transportation needs    Medical: Not on file    Non-medical: Not on file  Tobacco Use  . Smoking status: Unknown If Ever Smoked  Substance and Sexual Activity  . Alcohol use: Not Currently  . Drug use: Never  . Sexual activity: Not on file  Lifestyle  . Physical activity    Days per week: Not on file    Minutes per session: Not on file  . Stress: Not on file  Relationships  . Social Musicianconnections    Talks on phone: Not on file    Gets together: Not on file    Attends religious service: Not on file    Active member of club or organization: Not on file    Attends meetings of clubs or organizations: Not on file    Relationship status: Not on file  Other Topics Concern  . Not on file  Social History Narrative  . Not on file   History reviewed. No pertinent family history. Scheduled Meds: . chlorhexidine  5 mL Mouth/Throat BID  . clonazePAM  0.5 mg Per Tube BID  . collagenase   Topical Daily  . enoxaparin (LOVENOX) injection  40 mg Subcutaneous Q24H  . [START ON 12/21/2018] fentaNYL  1 patch Transdermal Q72H  . free water  200 mL Per Tube Q8H  . insulin aspart  0-9 Units Subcutaneous Q4H  . levothyroxine  50 mcg Per Tube Daily  . mouth rinse  15 mL Mouth Rinse q12n4p  . pantoprazole sodium  40 mg Per Tube Daily  . polyethylene glycol  17 g Per Tube Daily  . risperiDONE  0.5  mg Per Tube QHS  . [START ON 12/21/2018] scopolamine  1 patch Transdermal Q72H  . sodium zirconium cyclosilicate  10 g Oral Daily   Continuous Infusions: . sodium chloride 125 mL/hr at 12/19/18 1231  . ceFEPime (MAXIPIME) IV Stopped (12/20/18 0116)  . feeding supplement (OSMOLITE 1.5 CAL) 50 mL/hr at 12/20/18 0236  .  sodium bicarbonate (isotonic) infusion in sterile water 50 mL/hr at 12/20/18 0626  . vancomycin 1,500 mg (12/20/18 0900)   PRN Meds:.acetaminophen **OR** acetaminophen, albuterol, ondansetron **OR** ondansetron (ZOFRAN) IV Medications Prior to Admission:  Prior to Admission  medications   Medication Sig Start Date End Date Taking? Authorizing Provider  ceFEPIme 1 g in sodium chloride 0.9 % 100 mL Inject 1 g into the vein every 12 (twelve) hours. For 7 days 12/14/18  Yes [provider]  chlorhexidine (PERIDEX) 0.12 % solution Use as directed 5 mLs in the mouth or throat See admin instructions. Every shift starting   Yes [provider]  clonazePAM (KLONOPIN) 0.5 MG tablet Place 0.5 mg into feeding tube 2 (two) times daily.   Yes [provider]  fentaNYL (DURAGESIC) 50 MCG/HR Place 1 patch onto the skin every 3 (three) days.   Yes [provider]  insulin glargine (LANTUS) 100 UNIT/ML injection Inject 12 Units into the skin 2 (two) times daily.   Yes [provider]  levothyroxine (SYNTHROID) 50 MCG tablet Place 50 mcg into feeding tube daily.   Yes [provider]  metoprolol tartrate (LOPRESSOR) 25 MG tablet Take 25 mg by mouth 2 (two) times daily.   Yes [provider]  Omega-3 Fatty Acids (FISH OIL) 1000 MG CAPS Take 1,000 mg by mouth daily.   Yes [provider]  pantoprazole (PROTONIX) 40 MG tablet Take 40 mg by mouth daily.   Yes [provider]  polyethylene glycol (MIRALAX / GLYCOLAX) 17 g packet Take 17 g by mouth daily.   Yes [provider]  risperiDONE (RISPERDAL) 0.5 MG tablet  Take 0.5 mg by mouth at bedtime.   Yes [provider]  scopolamine (TRANSDERM-SCOP) 1 MG/3DAYS Place 1 patch onto the skin every 3 (three) days.   Yes [provider]  vancomycin 1,000 mg in sodium chloride 0.9 % 250 mL Inject 1,000 mg into the vein daily. For 7 days 12/15/18  Yes [provider]   No Known Allergies Review of Systems  Unable to perform ROS: Patient nonverbal    Physical Exam Vitals signs and nursing note reviewed.  Constitutional:      Comments: Older, obese female.  She is in no acute distress.  She has a trach, PEG.  Will only respond yes or no intermittently  HENT:     Head: Normocephalic and atraumatic.  Cardiovascular:     Rate and Rhythm: Normal rate.  Pulmonary:     Comments: Trach Abdominal:     Palpations: Abdomen is soft.  Skin:    General: Skin is warm and dry.     Coloration: Skin is pale.  Neurological:     Comments: Unable to test.  Patient is bedbound, tracheostomy as well as PEG Is not conversant  Psychiatric:     Comments: No overt agitation otherwise unable to perform mental status exam     Vital Signs: BP (!) 89/57 (BP Location: Left Arm)   Pulse 60   Temp 98.7 F (37.1 C) (Axillary)   Resp 19   Ht 5\' 5"  (1.651 m)   Wt 100.2 kg   LMP  (LMP Unknown)   SpO2 93%   BMI 36.76 kg/m  Pain Scale: PAINAD       SpO2: SpO2: 93 % O2 Device:SpO2: 93 % O2 Flow Rate: .O2 Flow Rate (L/min): 5 L/min  IO: Intake/output summary:   Intake/Output Summary (Last 24 hours) at 12/20/2018 1135 Last data filed at 12/20/2018 0626 Gross per 24 hour  Intake 3183.66 ml  Output -  Net 3183.66 ml    LBM: Last BM Date: 12/25/2018 Baseline Weight: Weight: 113.9 kg Most recent weight: Weight: 100.2 kg  Palliative Assessment/Data:   Flowsheet Rows     Most Recent Value  Intake Tab  Referral Department  Hospitalist  Unit at Time of Referral  Med/Surg Unit  Palliative Care Primary Diagnosis  Sepsis/Infectious Disease   Date Notified  12/19/18  Palliative Care Type  New Palliative care  Reason for referral  Clarify Goals of Care  Date of Admission  01/08/19  Date first seen by Palliative Care  12/20/18  # of days Palliative referral response time  1 Day(s)  # of days IP prior to Palliative referral  1  Clinical Assessment  Palliative Performance Scale Score  30%  Pain Max last 24 hours  Not able to report  Pain Min Last 24 hours  Not able to report  Dyspnea Max Last 24 Hours  Not able to report  Dyspnea Min Last 24 hours  Not able to report  Nausea Max Last 24 Hours  Not able to report  Nausea Min Last 24 Hours  Not able to report  Anxiety Max Last 24 Hours  Not able to report  Anxiety Min Last 24 Hours  Not able to report  Other Max Last 24 Hours  Not able to report  Psychosocial & Spiritual Assessment  Palliative Care Outcomes      Time In: 1230 Time Out: 1345 Time Total: 75 min Greater than 50%  of this time was spent counseling and coordinating care related to the above assessment and plan. Staffed with Dr. Ree Kida  Signed by: Dory Horn, NP   Please contact Palliative Medicine Team phone at [phone] for questions and concerns.  For individual provider: See Shea Evans

## 2018-12-20 NOTE — Progress Notes (Signed)
[facility] Progress Note    Assessment/ Plan:   73 year old lady with a history of dementia status post tracheostomy for chronic respiratory failure resident at [facility] to the emergency room for evaluation of hypotension. H/o COPD, chronic afib, DM. [facility] and required intubation 10/23/2018 with history of cardiac arrest requiring CPR. She underwent placement of tracheostomy 11/06/2018. She underwent G-tube placement 11/21/2018 for enteral access. Was transferred from  [facility]. She has a right upper extremity PICC line and has been receiving vancomycin and cefepime. Rx Lokelma 10g in the ED.   1) Acute kidney injury with progressive decline in renal function likely secondary to sepsis, hypotension and shock. She has marked azotemia out of proportion to increase in a creatinine. She has poor protoplasm with tracheostomy G-tube and ventilator dependent respiratory failure.   - Appears volume depleted + ?sepsis (was being treated w/ Abx)  - Being volume resuscitated and renal function has stabilized with downward trend.  - She is not a candidate for dialysis.  - U/s does not show obstruction.  2) Hypotension and shock, it does not appear that she has an acute GI bleed although serial hemoglobin should be followed and careful observation for GI blood loss. 2D echo should be considered, blood cultures sent already.   3) Hyperkalemia  - decrease Lokelma to once daily and will titrate off as tolerated.  4) Hypernatremia -  issue was access to free water Currently rx 258ml q8hr. Free water deficit of 2.5L initially.  Decr free water PEG to 200 q8 hr. Will plan on stopping the D5W + 1 amp HCO3 @ 50 tomorrow pending labs.  5) Sepsis would check blood cultures continue cefepime and check a vancomycin level. Etiology could be secondary to aspiration pneumonia.  6) Anemia appears to be not an issue at  this time we will continue to follow serum hemoglobin  7) Severe COPD with respiratory failure and tracheostomy 8) Congestive heart failure recommend 2D echo  Subjective:   Nonverbal.   Objective:   BP (!) 102/42 (BP Location: Left Arm)   Pulse 63   Temp 98.7 F (37.1 C) (Axillary)   Resp 17   Ht 5\' 5"  (1.651 m)   Wt 100.2 kg   LMP  (LMP Unknown)   SpO2 96%   BMI 36.76 kg/m   Intake/Output Summary (Last 24 hours) at 12/20/2018 6440 Last data filed at 12/20/2018 3474 Gross per 24 hour  Intake 3183.66 ml  Output -  Net 3183.66 ml   Weight change: -13.7 kg  Physical Exam: GEN: NCAT HEENT: no icterus NECK: Trach LUNGS: Rhonchi CV: RRR ABD: PEG  EXT: No lower extremity edema  Imaging: US Renal  Result Date: 12/15/2018 CLINICAL DATA:  Acute kidney injury EXAM: RENAL / URINARY TRACT ULTRASOUND COMPLETE COMPARISON:  CT 11/20/2018 FINDINGS: Right Kidney: Renal measurements: 11.5 x 5.8 x 5.9 cm = volume: 206.1 mL. Echogenicity normal. Renal cortical thinning. No hydronephrosis Left Kidney: Renal measurements: 11.4 x 6.6 x 5.3 cm = volume: 208.8 mL. Cortical echogenicity normal. No hydronephrosis. Renal cortical thinning. Bladder: Bladder is unremarkable. IMPRESSION: Difficult visualization of kidneys due to habitus. Negative for hydronephrosis. Mild renal cortical thinning. Electronically Signed   By: Donavan Foil M.D.   On: 12/28/2018 23:38   Dg Chest Port 1 View  Result Date: 12/28/2018 CLINICAL DATA:  Hemoptysis. EXAM: PORTABLE CHEST 1 VIEW COMPARISON:  Radiograph of Nov 17, 2018. FINDINGS: Stable cardiomegaly. Nasogastric tube has been  removed. Tracheostomy tube is unchanged in position. No pneumothorax is noted. Left lung is clear. Stable right basilar atelectasis or infiltrate is noted. Bony thorax is unremarkable. IMPRESSION: Stable right basilar atelectasis or infiltrate is noted. Electronically Signed   By: Lupita RaiderJames  Green Jr M.D.   On: 01-15-2019 18:41     Labs: BMET Recent Labs  Lab Aug 08, 2018 1834 Aug 08, 2018 2115 12/19/18 0742 12/20/18 0637  NA 143  --  148* 139  K 6.5* 6.2* 5.7* 4.8  CL 111  --  116* 109  CO2 23  --  28 23  GLUCOSE 354*  --  153* 286*  BUN 161*  --  145* 119*  CREATININE 2.28*  --  2.19* 1.92*  CALCIUM 10.2  --  10.0 8.9  PHOS 2.9  --  3.1 2.6   CBC Recent Labs  Lab Aug 08, 2018 1834 12/19/18 0742 12/20/18 0637  WBC 10.6* 9.7 11.0*  NEUTROABS 8.3*  --   --   HGB 10.2* 9.1* 8.6*  HCT 35.4* 31.8* 29.4*  MCV 95.4 94.1 92.2  PLT 217 196 173    Medications:    . chlorhexidine  5 mL Mouth/Throat BID  . clonazePAM  0.5 mg Per Tube BID  . collagenase   Topical Daily  . enoxaparin (LOVENOX) injection  40 mg Subcutaneous Q24H  . [START ON 12/21/2018] fentaNYL  1 patch Transdermal Q72H  . free water  200 mL Per Tube Q6H  . insulin aspart  0-9 Units Subcutaneous Q4H  . levothyroxine  50 mcg Per Tube Daily  . mouth rinse  15 mL Mouth Rinse q12n4p  . pantoprazole sodium  40 mg Per Tube Daily  . polyethylene glycol  17 g Per Tube Daily  . risperiDONE  0.5 mg Per Tube QHS  . [START ON 12/21/2018] scopolamine  1 patch Transdermal Q72H  . sodium zirconium cyclosilicate  10 g Oral TID      Paulene FloorJames Jakory Matsuo, MD 12/20/2018, 9:21 AM

## 2018-12-20 NOTE — Progress Notes (Signed)
Pharmacy Antibiotic Note  Shannon Lang is a 73 y.o. female admitted on 01/13/19 with sepsis.  Pharmacy has been consulted for vancomycin dosing.Patient found to have AKI on transfer to [facility]. Scr 2.19>>1.92.   Patient was receiving vancomycin 1g IV q24h at [facility] per [facility] since 6/8, last dose on 6/11 at 0900. Vancomycin random level on transfer was 28. Vancomycin subsequently changed to variable dosing based on renal function. Vancomycin random level drawn this AM was 18 mcg after ~33 hours. Based on rate of elimination will schedule dose.   Plan: Vancomycin 1500mg  IV q48h Est AUC 447  Monitor renal function, LOT, culture data   Temp (24hrs), Avg:98.2 F (36.8 C), Min:97.7 F (36.5 C), Max:98.7 F (37.1 C)  Recent Labs  Lab 01-13-19 1834 2019-01-13 2115 Jan 13, 2019 2125 12/19/18 0742 12/20/18 0637  WBC 10.6*  --   --  9.7 11.0*  CREATININE 2.28*  --   --  2.19* 1.92*  LATICACIDVEN  --   --  1.4 1.1  --   VANCORANDOM  --  28  --   --  18    Estimated Creatinine Clearance: 32 mL/min (A) (by C-G formula based on SCr of 1.92 mg/dL (H)).    No Known Allergies  Antimicrobials this admission: Cefepime 6/7 PTA >> Vancomycin 6/8 PTA>>  Shannon Lang A. [name], PharmD, [place] Pager: [phone] Please utilize Amion for appropriate phone number to reach the unit pharmacist ([place])   12/20/2018 8:21 AM

## 2018-12-21 ENCOUNTER — Inpatient Hospital Stay (HOSPITAL_COMMUNITY): Payer: Medicare Other

## 2018-12-21 LAB — IRON AND TIBC
Iron: 31 ug/dL (ref 28–170)
Saturation Ratios: 23 % (ref 10.4–31.8)
TIBC: 134 ug/dL — ABNORMAL LOW (ref 250–450)
UIBC: 103 ug/dL

## 2018-12-21 LAB — RENAL FUNCTION PANEL
Albumin: 1.3 g/dL — ABNORMAL LOW (ref 3.5–5.0)
Anion gap: 7 (ref 5–15)
BUN: 91 mg/dL — ABNORMAL HIGH (ref 8–23)
CO2: 24 mmol/L (ref 22–32)
Calcium: 8.4 mg/dL — ABNORMAL LOW (ref 8.9–10.3)
Chloride: 106 mmol/L (ref 98–111)
Creatinine, Ser: 1.65 mg/dL — ABNORMAL HIGH (ref 0.44–1.00)
GFR calc Af Amer: 36 mL/min — ABNORMAL LOW (ref >60)
GFR calc non Af Amer: 31 mL/min — ABNORMAL LOW (ref >60)
Glucose, Bld: 294 mg/dL — ABNORMAL HIGH (ref 70–99)
Phosphorus: 2.9 mg/dL (ref 2.5–4.6)
Potassium: 4.5 mmol/L (ref 3.5–5.1)
Sodium: 137 mmol/L (ref 135–145)

## 2018-12-21 LAB — FOLATE: Folate: 10.6 ng/mL (ref >5.9)

## 2018-12-21 LAB — CBC
HCT: 25.4 % — ABNORMAL LOW (ref 36.0–46.0)
Hemoglobin: 7.6 g/dL — ABNORMAL LOW (ref 12.0–15.0)
MCH: 27.3 pg (ref 26.0–34.0)
MCHC: 29.9 g/dL — ABNORMAL LOW (ref 30.0–36.0)
MCV: 91.4 fL (ref 80.0–100.0)
Platelets: 166 10*3/uL (ref 150–400)
RBC: 2.78 MIL/uL — ABNORMAL LOW (ref 3.87–5.11)
RDW: 16.4 % — ABNORMAL HIGH (ref 11.5–15.5)
WBC: 9.4 10*3/uL (ref 4.0–10.5)
nRBC: 0 % (ref 0.0–0.2)

## 2018-12-21 LAB — VITAMIN B12: Vitamin B-12: 639 pg/mL (ref 180–914)

## 2018-12-21 LAB — OCCULT BLOOD X 1 CARD TO LAB, STOOL: Fecal Occult Bld: NEGATIVE

## 2018-12-21 LAB — CULTURE, RESPIRATORY W GRAM STAIN

## 2018-12-21 LAB — RETICULOCYTES
Immature Retic Fract: 13.2 % (ref 2.3–15.9)
RBC.: 2.91 MIL/uL — ABNORMAL LOW (ref 3.87–5.11)
Retic Count, Absolute: 37 10*3/uL (ref 19.0–186.0)
Retic Ct Pct: 1.3 % (ref 0.4–3.1)

## 2018-12-21 LAB — FERRITIN: Ferritin: 242 ng/mL (ref 11–307)

## 2018-12-21 LAB — GLUCOSE, CAPILLARY: Glucose-Capillary: 277 mg/dL — ABNORMAL HIGH (ref 70–99)

## 2018-12-21 MED ORDER — SODIUM CHLORIDE 0.9 % IV SOLN
INTRAVENOUS | Status: DC | PRN
  Administered 2018-12-21: 250 mL via INTRAVENOUS

## 2018-12-21 NOTE — Progress Notes (Addendum)
PROGRESS NOTE    Shannon Lang  ZOX:[id number] DOB: 02/14/1946 DOA: 11-26-18 PCP: System, Pcp Not In   Brief Narrative:  HPI on 11-26-18 by Dr. Lyda PeroneJared Lang Shannon NianSheila Lang is a 73 y.o. female with medical history significant of dementia, COPD, Respiratory failure requiring intubation on 4/16, also apparently cardiac arrest requiring CPR.  Patient s/p tracheostomy (4/30) and PEG placement (5/15).  Patient resides at [facility] since that time.  Patient has been having fever, increasing O2 requirement at [facility].  Was apparently started on cefepime and vanc there recently.  Today apparently also had AKF and hypotension which prompted them to send her in to the ED.  Interim history Admitted for AKI, acute on chronic resp failure secondary to poss pneumonia. Nephrology, PCCM, palliative care consulted.  Assessment & Plan   Acute kidney injury with hyperkalemia -Creatinine on admission 2.28, baseline unknown. Currently 1.65 -Nephrology consulted and appreciated, feels patient is not a candidate for long-term dialysis.  Also feels that her creatinine and GFR may be a lot worse than the calculated GFR due to poor muscle mass.  Patient's azotemia is out of proportion to her increase in creatinine. -Renal ultrasound: Difficult visualization of kidneys due to habitus.  Negative for hydronephrosis.  Mild renal cortical thinning. -Continue IV fluids- rate decreased today -patient was given lokelma for hyperkalemia along with albuterol, insulin and glucose, calcium gluconate, IV bicarb -Potassium 4.5 today -Continue to monitor BMP  Acute on chronic respiratory failure with hypoxia with trach -patient with tracheostomy  -PCCM consulted and appreciated -currently on 5L Newborn  RLL, suspect HCAP -Supposedly per PCCM note, patient had fever of 100, however patient was mildly hypothermic with of 96.33F -CXR shows stable right basilar atelectasis vs infiltrate -no leukocytosis, fever, or tachycarida  noted -lactic acid normal -Continue vancomycin and cefepime -PCCM added urine strep pneumoniae and legionella antigens -urine Strep pneumo Ag negative -Respiratory culture: Rare GNR -COVID negative  Hypernatremia/hyperchloremia -Cchanged IVF to half normal -Sodium improving, down to 137, Chloride down to 106 -PCCM ordered free water  -continue to monitor BMP  Acute normocytic anemia -hemoglobin down to 7.6 today -possibly dilutional as patient has been on IVF -Will obtain FOBT and anemia panel -discontinue lovenox -Continue to monitor H/H  PAF -metoprolol currently held -currently in sinus rhythm  Chronic diastolic congestive heart failure -per medical history -Appears euvolemic and compensated -monitor intake/output, daily weights -Echocardiogram EF 55-60%, LV diastolic parameters are indeterminate  Diabetes mellitus, type 2 -Continue ISS and CBG monitoring  Dementia  -Continue klonopin, risperdal  Sacral decubitus ulcer -stage 3-4 -present on admission -Wound care consulted  Goals of care -patient with recent history of arrest, tracheostomy  -Currently full code -Palliative care consulted and appreciated  DVT Prophylaxis  Lovenox --> SCDs  Code Status: Full  Family Communication: None at bedside. Daughter via phone  Disposition Plan: Admitted. Continue to monitor, treat poss pneumonia. Pending improvement in renal function. LTAC when stable  Consultants PCCM Nephrology  Palliative care  Procedures  US Renal  Antibiotics   Anti-infectives (From admission, onward)   Start     Dose/Rate Route Frequency Ordered Stop   12/20/18 0830  vancomycin (VANCOCIN) 1,500 mg in sodium chloride 0.9 % 500 mL IVPB     1,500 mg 250 mL/hr over 120 Minutes Intravenous Every 48 hours 12/20/18 0827     12/19/18 1900  ceFEPIme (MAXIPIME) 2 g in sodium chloride 0.9 % 100 mL IVPB     2 g 200 mL/hr over 30 Minutes Intravenous  Every 24 hours 12/21/2018 2217     12/19/18  0800  ceFEPIme (MAXIPIME) 2 g in sodium chloride 0.9 % 100 mL IVPB  Status:  Discontinued     2 g 200 mL/hr over 30 Minutes Intravenous Every 12 hours 12/09/2018 2026 12/23/2018 2217   12/23/2018 2211  vancomycin variable dose per unstable renal function (pharmacist dosing)  Status:  Discontinued      Does not apply See admin instructions 12/13/2018 2211 12/20/18 0827   12/20/2018 1845  ceFEPIme (MAXIPIME) 2 g in sodium chloride 0.9 % 100 mL IVPB     2 g 200 mL/hr over 30 Minutes Intravenous  Once 12/22/2018 1838 01/02/2019 2034      Subjective:   Shannon NianSheila Ketterman seen and examined today.  Patient with dementia. Nonverbal  Objective:   Vitals:   12/21/18 0300 12/21/18 0402 12/21/18 0722 12/21/18 0816  BP: (!) 130/59  (!) 115/56 (!) 115/56  Pulse: 66 61 64 67  Resp: 14 20 14 18   Temp: 98.4 F (36.9 C)  98.3 F (36.8 C)   TempSrc: Axillary  Axillary   SpO2: 97% 96% 95% 98%  Weight:      Height:        Intake/Output Summary (Last 24 hours) at 12/21/2018 0920 Last data filed at 12/21/2018 0500 Gross per 24 hour  Intake 2190 ml  Output 1100 ml  Net 1090 ml   Filed Weights   12/19/18 0000 12/19/18 0030 12/20/18 0348  Weight: 113.9 kg 94.4 kg 100.2 kg   Exam  General: Well developed, chronically ill appearing, NAD  HEENT: NCAT, mucous membranes moist.   Neck: Trach  Cardiovascular: S1 S2 auscultated,RRR, no murmur  Respiratory: Diminished but clear anteriorly  Abdomen: Soft, nontender, nondistended, + bowel sounds  Extremities: warm dry without cyanosis clubbing. Trace LE edema  Neuro: Eyes open, nonverbal. H/o dementia. RUE tremor  Psych: Cannot assess  Data Reviewed: I have personally reviewed following labs and imaging studies  CBC: Recent Labs  Lab 12/24/2018 1834 12/19/18 0742 12/20/18 0637 12/21/18 0624  WBC 10.6* 9.7 11.0* 9.4  NEUTROABS 8.3*  --   --   --   HGB 10.2* 9.1* 8.6* 7.6*  HCT 35.4* 31.8* 29.4* 25.4*  MCV 95.4 94.1 92.2 91.4  PLT 217 196 173 166    Basic Metabolic Panel: Recent Labs  Lab 01/01/2019 1834 12/09/2018 2115 12/19/18 0742 12/19/18 1857 12/20/18 0637 12/20/18 1709 12/21/18 0624  NA 143  --  148*  --  139  --  137  K 6.5* 6.2* 5.7*  --  4.8  --  4.5  CL 111  --  116*  --  109  --  106  CO2 23  --  28  --  23  --  24  GLUCOSE 354*  --  153*  --  286*  --  294*  BUN 161*  --  145*  --  119*  --  91*  CREATININE 2.28*  --  2.19*  --  1.92*  --  1.65*  CALCIUM 10.2  --  10.0  --  8.9  --  8.4*  MG 3.1*  --  3.1* 2.7* 2.5* 2.3  --   PHOS 2.9  --  3.1  --  2.6  --  2.9   GFR: Estimated Creatinine Clearance: 37.2 mL/min (A) (by C-G formula based on SCr of 1.65 mg/dL (H)). Liver Function Tests: Recent Labs  Lab 01/04/2019 1834 12/19/18 0742 12/20/18 0637 12/21/18 0624  AST 32  --   --   --  ALT 43  --   --   --   ALKPHOS 131*  --   --   --   BILITOT 0.3  --   --   --   PROT 7.0  --   --   --   ALBUMIN 1.7* 1.7* 1.5* 1.3*   No results for input(s): LIPASE, AMYLASE in the last 168 hours. No results for input(s): AMMONIA in the last 168 hours. Coagulation Profile: Recent Labs  Lab 12/17/2018 1834  INR 1.1   Cardiac Enzymes: No results for input(s): CKTOTAL, CKMB, CKMBINDEX, TROPONINI in the last 168 hours. BNP (last 3 results) No results for input(s): PROBNP in the last 8760 hours. HbA1C: No results for input(s): HGBA1C in the last 72 hours. CBG: Recent Labs  Lab 12/20/18 1137 12/20/18 1720 12/20/18 1938 12/20/18 2351 12/21/18 0419  GLUCAP 288* 182* 196* 260* 277*   Lipid Profile: No results for input(s): CHOL, HDL, LDLCALC, TRIG, CHOLHDL, LDLDIRECT in the last 72 hours. Thyroid Function Tests: Recent Labs    12/12/2018 2244  TSH 3.132   Anemia Panel: No results for input(s): VITAMINB12, FOLATE, FERRITIN, TIBC, IRON, RETICCTPCT in the last 72 hours. Urine analysis:    Component Value Date/Time   COLORURINE RED (A) 12/19/2018 2020   APPEARANCEUR TURBID (A) 12/19/2018 2020   LABSPEC NOT DONE  12/19/2018 2020   PHURINE NOT DONE 12/19/2018 2020   GLUCOSEU (A) 12/19/2018 2020    TEST NOT REPORTED DUE TO COLOR INTERFERENCE OF URINE PIGMENT   HGBUR (A) 12/19/2018 2020    TEST NOT REPORTED DUE TO COLOR INTERFERENCE OF URINE PIGMENT   BILIRUBINUR (A) 12/19/2018 2020    TEST NOT REPORTED DUE TO COLOR INTERFERENCE OF URINE PIGMENT   KETONESUR (A) 12/19/2018 2020    TEST NOT REPORTED DUE TO COLOR INTERFERENCE OF URINE PIGMENT   PROTEINUR (A) 12/19/2018 2020    TEST NOT REPORTED DUE TO COLOR INTERFERENCE OF URINE PIGMENT   NITRITE (A) 12/19/2018 2020    TEST NOT REPORTED DUE TO COLOR INTERFERENCE OF URINE PIGMENT   LEUKOCYTESUR (A) 12/19/2018 2020    TEST NOT REPORTED DUE TO COLOR INTERFERENCE OF URINE PIGMENT   Sepsis Labs: @LABRCNTIP (procalcitonin:4,lacticidven:4)  ) Recent Results (from the past 240 hour(s))  Blood Culture (routine x 2)     Status: None (Preliminary result)   Collection Time: 12/22/2018  5:41 PM   Specimen: BLOOD LEFT HAND  Result Value Ref Range Status   Specimen Description BLOOD LEFT HAND  Final   Special Requests   Final    BOTTLES DRAWN AEROBIC AND ANAEROBIC Blood Culture adequate volume   Culture   Final    NO GROWTH 3 DAYS Performed at [facility], [address] [place], [place] [address]    Report Status PENDING  Incomplete  SARS Coronavirus 2     Status: None   Collection Time: 12/11/2018  8:39 PM  Result Value Ref Range Status   SARS Coronavirus 2 NOT DETECTED NOT DETECTED Final    Comment: (NOTE) SARS-CoV-2 target nucleic acids are NOT DETECTED. The SARS-CoV-2 RNA is generally detectable in upper and lower respiratory specimens during the acute phase of infection.  Negative  results do not preclude SARS-CoV-2 infection, do not rule out co-infections with other pathogens, and should not be used as the sole basis for treatment or other patient management decisions.  Negative results must be combined with clinical observations, patient  history, and epidemiological information. The expected result is Not Detected.  Fact Sheet for Patients: http://www.biofiredefense.com/wp-content/uploads/2020/03/BIOFIRE-COVID -19-patients.pdf Fact Sheet for Healthcare Providers: http://www.biofiredefense.com/wp-content/uploads/2020/03/BIOFIRE-COVID -19-hcp.pdf This test is not yet approved or cleared by the [place] and  has been authorized for detection and/or diagnosis of SARS-CoV-2 by FDA under an Emergency Use Authorization (EUA).  This EUA will remain in effec t (meaning this test can be used) for the duration of  the COVID-19 declaration under Section 564(b)(1) of the Act, 21 U.S.C. section 360bbb-3(b)(1), unless the authorization is terminated or revoked sooner. Performed at [facility], [place] [address] [place], [place] [address]   Expectorated sputum assessment w rflx to resp cult     Status: None   Collection Time: 2018-12-24  9:02 PM   Specimen: Tracheal Aspirate; Sputum  Result Value Ref Range Status   Specimen Description TRACHEAL ASPIRATE  Final   Special Requests NONE  Final   Sputum evaluation   Final    THIS SPECIMEN IS ACCEPTABLE FOR SPUTUM CULTURE Performed at [facility], [place] [address], [place], [place] [address]    Report Status 12/19/2018 FINAL  Final  Culture, respiratory     Status: None (Preliminary result)   Collection Time: 2018-12-24  9:02 PM   Specimen: Tracheal Aspirate  Result Value Ref Range Status   Specimen Description TRACHEAL ASPIRATE  Final   Special Requests NONE Reflexed from [id number]  Final   Gram Stain   Final    ABUNDANT WBC PRESENT, PREDOMINANTLY PMN FEW SQUAMOUS EPITHELIAL CELLS PRESENT RARE GRAM NEGATIVE RODS Performed at [facility], [place] [address], [place], [place] [address]    Culture FEW PSEUDOMONAS AERUGINOSA  Final   Report Status PENDING  Incomplete  MRSA PCR Screening     Status: Abnormal   Collection Time: 12/19/18  2:28 AM   Specimen: Nasal  Mucosa; Nasopharyngeal  Result Value Ref Range Status   MRSA by PCR POSITIVE (A) NEGATIVE Final    Comment:        The GeneXpert MRSA Assay (FDA approved for NASAL specimens only), is one component of a comprehensive MRSA colonization surveillance program. It is not intended to diagnose MRSA infection nor to guide or monitor treatment for MRSA infections. RESULT CALLED TO, READ BACK BY AND VERIFIED WITH: RN M DEBASTIANI @0632  12/19/18 BY S GEZAHEGN Performed at [facility], [address], [place], [place] [address]   Blood Culture (routine x 2)     Status: None (Preliminary result)   Collection Time: 12/19/18  7:42 AM   Specimen: BLOOD  Result Value Ref Range Status   Specimen Description BLOOD LEFT ANTECUBITAL  Final   Special Requests   Final    BOTTLES DRAWN AEROBIC ONLY Blood Culture adequate volume   Culture   Final    NO GROWTH 2 DAYS Performed at [facility], [place] [address], [place], [place] [address]    Report Status PENDING  Incomplete      Radiology Studies: Dg Chest Port 1 View  Result Date: 12/20/2018 CLINICAL DATA:  Respiratory failure. EXAM: PORTABLE CHEST 1 VIEW COMPARISON:  24-Dec-2018 FINDINGS: The patient is less rotated on today's examination. Tracheostomy tube overlies the airway. The cardiac silhouette is slightly enlarged. The lungs are mildly hypoinflated, improved from prior. Right basilar airspace opacity has mildly improved. There is minimal left basilar atelectasis. There may be a small right pleural effusion. No pneumothorax is identified. IMPRESSION: Mildly improved right basilar aeration. Electronically Signed   By: Seymour Bars.D.  On: 12/20/2018 10:21     Scheduled Meds: . chlorhexidine  5 mL Mouth/Throat BID  . clonazePAM  0.5 mg Per Tube BID  . collagenase   Topical Daily  . enoxaparin (LOVENOX) injection  40 mg Subcutaneous Q24H  . fentaNYL  1 patch Transdermal Q72H  . free water  200 mL Per Tube Q8H  . insulin aspart   0-9 Units Subcutaneous Q4H  . levothyroxine  50 mcg Per Tube Daily  . mouth rinse  15 mL Mouth Rinse q12n4p  . pantoprazole sodium  40 mg Per Tube Daily  . polyethylene glycol  17 g Per Tube Daily  . risperiDONE  0.5 mg Per Tube QHS  . scopolamine  1 patch Transdermal Q72H  . sodium chloride flush  10-40 mL Intracatheter Q12H   Continuous Infusions: . sodium chloride 50 mL/hr at 12/21/18 0846  . ceFEPime (MAXIPIME) IV 2 g (12/20/18 2050)  . feeding supplement (OSMOLITE 1.5 CAL) 50 mL/hr at 12/20/18 0236  . vancomycin 1,500 mg (12/20/18 0900)     LOS: 3 days   Time Spent in minutes   45 minutes  Tylyn Stankovich D.O. on 12/21/2018 at 9:20 AM  Between 7am to 7pm - Please see pager noted on [email or web address]  After 7pm go to [email or web address]  And look for the night coverage person covering for me after hours  [facility] Office  [phone]

## 2018-12-21 NOTE — Plan of Care (Signed)
  Problem: Clinical Measurements: Goal: Diagnostic test results will improve Outcome: Progressing   Problem: Nutrition: Goal: Adequate nutrition will be maintained Outcome: Progressing   

## 2018-12-21 NOTE — Progress Notes (Addendum)
[facility] Progress Note    Assessment/ Plan:   73 year old lady with a history of dementia status post tracheostomy for chronic respiratory failure resident at [place] to the emergency room for evaluation of hypotension.H/oCOPD, chronic afib, DM. [facility] with history of cardiac arrest requiring CPR. She underwent placement of tracheostomy 11/06/2018. She underwent G-tube placement 11/21/2018 for enteral access.[facility] She has a right upper extremity PICC line and has been receiving vancomycin and cefepime.Rx Lokelma 10g in the ED.  1)Acute kidney injury with progressive decline in renal functionlikely secondary to sepsis, hypotension and shock. She has marked azotemia out of proportion to increase in a creatinine. She has poor protoplasm with tracheostomy G-tube and ventilator dependent respiratory failure.  - Appearsvolume depleted + ?sepsis (was being treated w/ Abx)  - Being volume resuscitated and renal function has stabilized with downward trend.  - She is not a candidate for dialysis.  - U/s does not show obstruction.  - improving renal function.  Signing off at this time; please reconsult as needed.  2)Hypotension and shock, it does not appear that she has an acute GI bleed although serial hemoglobin should be followed and careful observation for GI blood loss. 2D echo -> EF 55-60%, blood cultures sent already (Bld cx NTD).  3)Hyperkalemia  - decrease Lokelma to once daily (on 6/13) - Let's stop the [facility] for now.  4) Hypernatremia -  issue was access to free water Currently rx 252ml q8hr. Free water deficit of 2.5L initially but now back to normal  Decreased the  free water PEG to 200 q8 hr on 6/13. Stop the HCO3. Decr 1/2 NS to 43ml/hr and stop tomorrow if labs are ok.  5)Sepsis would check blood cultures  continue cefepime and check a vancomycin level. Etiology could be secondary to aspiration pneumonia.  6)Anemia appears to be not an issue at this time we will continue to follow serum hemoglobin  7)Severe COPD with respiratory failure and tracheostomy 8)Congestive heart failure recommend 2D echo  Subjective:   Nonverbal. Opening her eyes this am and responsive to touch but not answering any questions appropriately.   Objective:   BP (!) 115/56 (BP Location: Right Wrist)   Pulse 64   Temp 98.3 F (36.8 C) (Axillary)   Resp 14   Ht 5\' 5"  (1.651 m)   Wt 100.2 kg   LMP  (LMP Unknown)   SpO2 95%   BMI 36.76 kg/m   Intake/Output Summary (Last 24 hours) at 12/21/2018 0735 Last data filed at 12/21/2018 0500 Gross per 24 hour  Intake 2190 ml  Output 1100 ml  Net 1090 ml   Weight change:   Physical Exam: GEN: NCAT HEENT:no icterus NECK:Trach LUNGS:Rhonchi CV: RRR ABD:PEG QQV:ZDGLOVF extremity edema  Imaging: Dg Chest Port 1 View  Result Date: 12/20/2018 CLINICAL DATA:  Respiratory failure. EXAM: PORTABLE CHEST 1 VIEW COMPARISON:  2018/12/28 FINDINGS: The patient is less rotated on today's examination. Tracheostomy tube overlies the airway. The cardiac silhouette is slightly enlarged. The lungs are mildly hypoinflated, improved from prior. Right basilar airspace opacity has mildly improved. There is minimal left basilar atelectasis. There may be a small right pleural effusion. No pneumothorax is identified. IMPRESSION: Mildly improved right basilar aeration. Electronically Signed   By: Logan Bores M.D.   On: 12/20/2018 10:21    Labs: BMET Recent Labs  Lab 12-28-18 1834 December 28, 2018 2115 12/19/18 0742 12/20/18 0637  NA 143  --  148* 139  K 6.5* 6.2* 5.7* 4.8  CL 111  --  116* 109  CO2 23  --  28 23  GLUCOSE 354*  --  153* 286*  BUN 161*  --  145* 119*  CREATININE 2.28*  --  2.19* 1.92*  CALCIUM 10.2  --  10.0 8.9  PHOS 2.9  --  3.1 2.6   CBC Recent Labs   Lab 12/10/2018 1834 12/19/18 0742 12/20/18 0637  WBC 10.6* 9.7 11.0*  NEUTROABS 8.3*  --   --   HGB 10.2* 9.1* 8.6*  HCT 35.4* 31.8* 29.4*  MCV 95.4 94.1 92.2  PLT 217 196 173    Medications:    . chlorhexidine  5 mL Mouth/Throat BID  . clonazePAM  0.5 mg Per Tube BID  . collagenase   Topical Daily  . enoxaparin (LOVENOX) injection  40 mg Subcutaneous Q24H  . fentaNYL  1 patch Transdermal Q72H  . free water  200 mL Per Tube Q8H  . insulin aspart  0-9 Units Subcutaneous Q4H  . levothyroxine  50 mcg Per Tube Daily  . mouth rinse  15 mL Mouth Rinse q12n4p  . pantoprazole sodium  40 mg Per Tube Daily  . polyethylene glycol  17 g Per Tube Daily  . risperiDONE  0.5 mg Per Tube QHS  . scopolamine  1 patch Transdermal Q72H  . sodium chloride flush  10-40 mL Intracatheter Q12H  . sodium zirconium cyclosilicate  10 g Oral Daily      Paulene FloorJames Jamillah Camilo, MD 12/21/2018, 7:35 AM

## 2018-12-21 NOTE — Progress Notes (Signed)
Paged [facility] about 11 beat run of Vtach. Patient was resting with eyes closed and easily aroused at time.

## 2018-12-22 DIAGNOSIS — Z930 Tracheostomy status: Secondary | ICD-10-CM

## 2018-12-22 DIAGNOSIS — F0280 Dementia in other diseases classified elsewhere without behavioral disturbance: Secondary | ICD-10-CM

## 2018-12-22 LAB — RENAL FUNCTION PANEL
Albumin: 1.3 g/dL — ABNORMAL LOW (ref 3.5–5.0)
Anion gap: 8 (ref 5–15)
BUN: 81 mg/dL — ABNORMAL HIGH (ref 8–23)
CO2: 23 mmol/L (ref 22–32)
Calcium: 8.5 mg/dL — ABNORMAL LOW (ref 8.9–10.3)
Chloride: 107 mmol/L (ref 98–111)
Creatinine, Ser: 1.68 mg/dL — ABNORMAL HIGH (ref 0.44–1.00)
GFR calc Af Amer: 35 mL/min — ABNORMAL LOW (ref >60)
GFR calc non Af Amer: 30 mL/min — ABNORMAL LOW (ref >60)
Glucose, Bld: 240 mg/dL — ABNORMAL HIGH (ref 70–99)
Phosphorus: 2.9 mg/dL (ref 2.5–4.6)
Potassium: 5 mmol/L (ref 3.5–5.1)
Sodium: 138 mmol/L (ref 135–145)

## 2018-12-22 LAB — CBC WITH DIFFERENTIAL/PLATELET
Abs Immature Granulocytes: 0.07 10*3/uL (ref 0.00–0.07)
Basophils Absolute: 0 10*3/uL (ref 0.0–0.1)
Basophils Relative: 0 %
Eosinophils Absolute: 0.1 10*3/uL (ref 0.0–0.5)
Eosinophils Relative: 1 %
HCT: 24.8 % — ABNORMAL LOW (ref 36.0–46.0)
Hemoglobin: 7.5 g/dL — ABNORMAL LOW (ref 12.0–15.0)
Immature Granulocytes: 1 %
Lymphocytes Relative: 16 %
Lymphs Abs: 1.6 10*3/uL (ref 0.7–4.0)
MCH: 27.4 pg (ref 26.0–34.0)
MCHC: 30.2 g/dL (ref 30.0–36.0)
MCV: 90.5 fL (ref 80.0–100.0)
Monocytes Absolute: 0.5 10*3/uL (ref 0.1–1.0)
Monocytes Relative: 5 %
Neutro Abs: 7.4 10*3/uL (ref 1.7–7.7)
Neutrophils Relative %: 77 %
Platelets: 175 10*3/uL (ref 150–400)
RBC: 2.74 MIL/uL — ABNORMAL LOW (ref 3.87–5.11)
RDW: 16.7 % — ABNORMAL HIGH (ref 11.5–15.5)
WBC: 9.7 10*3/uL (ref 4.0–10.5)
nRBC: 0 % (ref 0.0–0.2)

## 2018-12-22 LAB — GLUCOSE, CAPILLARY
Glucose-Capillary: 217 mg/dL — ABNORMAL HIGH (ref 70–99)
Glucose-Capillary: 221 mg/dL — ABNORMAL HIGH (ref 70–99)
Glucose-Capillary: 225 mg/dL — ABNORMAL HIGH (ref 70–99)
Glucose-Capillary: 227 mg/dL — ABNORMAL HIGH (ref 70–99)
Glucose-Capillary: 233 mg/dL — ABNORMAL HIGH (ref 70–99)
Glucose-Capillary: 241 mg/dL — ABNORMAL HIGH (ref 70–99)
Glucose-Capillary: 252 mg/dL — ABNORMAL HIGH (ref 70–99)
Glucose-Capillary: 268 mg/dL — ABNORMAL HIGH (ref 70–99)
Glucose-Capillary: 263 mg/dL — ABNORMAL HIGH (ref 70–99)
Glucose-Capillary: 248 mg/dL — ABNORMAL HIGH (ref 70–99)

## 2018-12-22 LAB — LEGIONELLA PNEUMOPHILA SEROGP 1 UR AG: L. pneumophila Serogp 1 Ur Ag: NEGATIVE

## 2018-12-22 LAB — PREPARE RBC (CROSSMATCH)

## 2018-12-22 LAB — MAGNESIUM: Magnesium: 2.1 mg/dL (ref 1.7–2.4)

## 2018-12-22 MED ORDER — MUPIROCIN 2 % EX OINT
1.0000 "application " | TOPICAL_OINTMENT | Freq: Two times a day (BID) | CUTANEOUS | Status: DC
  Administered 2018-12-22 – 2018-12-26 (×9): 1 via NASAL
  Filled 2018-12-22: qty 22

## 2018-12-22 MED ORDER — SODIUM CHLORIDE 0.9% IV SOLUTION: Freq: Once | INTRAVENOUS | Status: DC

## 2018-12-22 MED ORDER — SODIUM CHLORIDE 0.9 % IV SOLN
2.0000 g | Freq: Two times a day (BID) | INTRAVENOUS | Status: DC
  Administered 2018-12-22 – 2018-12-24 (×5): 2 g via INTRAVENOUS
  Filled 2018-12-22 (×6): qty 2

## 2018-12-22 MED ORDER — CHLORHEXIDINE GLUCONATE CLOTH 2 % EX PADS
6.0000 | MEDICATED_PAD | Freq: Every day | CUTANEOUS | Status: AC
  Administered 2018-12-22 – 2018-12-26 (×5): 6 via TOPICAL

## 2018-12-22 NOTE — Progress Notes (Signed)
Pharmacy Antibiotic Note  Shannon Lang is a 73 y.o. female admitted on 12/09/2018 with sepsis.  Pharmacy has been consulted for vancomycin dosing.Patient found to have AKI on transfer to [facility]. Scr 2.19>>1.92.   WBC WNL, Scr 1.68 (CrCl 37 mL/min), afebrile. Tracheal aspirate growing pseudomonas aeruginosa (R cipro) - vanc discontinued. Given improvement in renal fx, will adjust cefepime dosing.   Plan: Adjust cefepime 2 g IV every 12 hours Monitor renal function, LOT, culture data   Temp (24hrs), Avg:98.5 F (36.9 C), Min:98.1 F (36.7 C), Max:98.8 F (37.1 C)  Recent Labs  Lab 12/17/2018 1834 12/22/2018 2115 01/06/2019 2125 12/19/18 0742 12/20/18 0637 12/21/18 0624 12/22/18 0637  WBC 10.6*  --   --  9.7 11.0* 9.4 9.7  CREATININE 2.28*  --   --  2.19* 1.92* 1.65* 1.68*  LATICACIDVEN  --   --  1.4 1.1  --   --   --   VANCORANDOM  --  28  --   --  18  --   --     Estimated Creatinine Clearance: 37.4 mL/min (A) (by C-G formula based on SCr of 1.68 mg/dL (H)).    No Known Allergies  Antimicrobials this admission: Cefepime 6/7 PTA >> Vancomycin 6/8 PTA>>6/15  Microbiology Results this admission: 6/12 Bcx- NGTD 6/12 MRSA PCR + 6/11 TA - PSA (R cipro) 6/11 COVID neg  Antonietta Jewel, PharmD, BCCCP Clinical Pharmacist  Pager: [phone] Phone: [phone] Please utilize Amion for appropriate phone number to reach the unit pharmacist ([place])   12/22/2018 11:13 AM

## 2018-12-22 NOTE — Care Management Important Message (Signed)
Important Message  Patient Details  Name: Shannon Lang MRN: [id number] Date of Birth: [birth year]   Medicare Important Message Given:  Yes    Orbie Pyo 12/22/2018, 3:34 PM

## 2018-12-22 NOTE — Progress Notes (Signed)
NAME:  Shannon NianSheila Hurlbut, MRN:  [id number], DOB:  04/21/1946, LOS: 4 ADMISSION DATE:  12/17/2018, CONSULTATION DATE: 12/23/2018 REFERRING MD: Emergency room, CHIEF COMPLAINT: Prerenal azotemia, fever  Brief History   Patient is a [age] year old with a history of dementia status post tracheostomy after arrest brought to the emergency room evaluation of hypotension and found to have prerenal azotemia  History of present illness   Patient is a [age] year old with a history of dementia status post tracheostomy after arrest brought to the emergency room evaluation of hypotension and found to have prerenal azotemia.  She has a BUN of 161 creatinine of 2.28 suggesting dehydration.  Unable to find any previous lab work.  She also has mild hypomagnesemia and hyperkalemia that is been treated in the emergency room with insulin and glucose.  She received thousand cc bolus with a systolic of 123 diastolic of 70 at this point time.  Not had any urine output in the ER Foley will be placed order given.  White count is 10.6.  Temperature has been as high as 100.  Chest x-ray shows chronic changes in the medial aspect of the right lower lobe. Patient has a recent history of tracheostomy done in April for acute on chronic respiratory failure.  Past Medical History   . Acute on chronic respiratory failure with hypoxia (HCC)   . Aspiration pneumonia due to gastric secretions (HCC)   . Atrial fibrillation with RVR (HCC)   . Chronic congestive heart failure with left ventricular diastolic dysfunction (HCC)   . COPD, severe (HCC)      Significant Hospital Events   NA  Consults:  6/11 PCCM 6/11 Renal  Procedures:  Trach placed previous admission 11/06/2018 PEG placed previous admission 11/21/2018  Significant Diagnostic Tests:  NA  Micro Data:  6/11 Blood>> 6/11 SARS Coronavirus 2>> Negative 6/12 Tracheal Aspirate>>  Antimicrobials:  Vanc 6/11>> Cefepime 6/11>>   Interim history/subjective:  No events  overnight  Objective   Blood pressure (!) 124/53, pulse 65, temperature 98.8 F (37.1 C), temperature source Oral, resp. rate 17, height 5\' 5"  (1.651 m), weight 104.8 kg, SpO2 98 %.    FiO2 (%):  [28 %] 28 %   Intake/Output Summary (Last 24 hours) at 12/22/2018 1128 Last data filed at 12/22/2018 0500 Gross per 24 hour  Intake 2319.97 ml  Output 1250 ml  Net 1069.97 ml   Filed Weights   12/19/18 0030 12/20/18 0348 12/22/18 0300  Weight: 94.4 kg 100.2 kg 104.8 kg   Examination: General: Chronically ill appearing, NAD HENT: [place]/AT, PERRL, EOM-I and MMM, trach in place Lungs: Coarse BS diffusely Cardiovascular: RRR, Nl S1/S2 and -M/R/G Abdomen: Soft, NT, ND and +BS Extremities: Trace peripheral edema Neuro: Nonfocal patient is nonverbal non communicative, opens eyes to stimulation, does not follow commands Skin: intact  I reviewed CXR myself, trach is in a good position  Resolved Hospital Problem list   NA  Assessment & Plan:   Hypoxemia:  - Titrate O2 for sat of 88-92%  - May need to consider IPPB with nebs to assist with atelectasis  ?HCAP:  - F/u on cultures  - Cefepime  - Vanc  - Recheck procal  Trach status:  - Maintain current trach size and type  - No downsize or change in type given respiratory demand  GOC:  - Would recommend involvement of palliative care  Discussed with PCCM-NP  Patient seen and examined, agree with above note.  I dictated the care and orders written for this  patient under my direction.  Rush Farmer, [place]

## 2018-12-22 NOTE — Progress Notes (Signed)
Inpatient Diabetes Program Recommendations  AACE/ADA: New Consensus Statement on Inpatient Glycemic Control (2015)  Target Ranges:  Prepandial:   less than 140 mg/dL      Peak postprandial:   less than 180 mg/dL (1-2 hours)      Critically ill patients:  140 - 180 mg/dL   Results for MARVETTE, SCHAMP (MRN [id number]) as of 12/22/2018 13:12  Ref. Range 12/21/2018 23:43 12/22/2018 03:53 12/22/2018 07:57 12/22/2018 11:51  Glucose-Capillary Latest Ref Range: 70 - 99 mg/dL 252 (H) 241 (H) 227 (H) 225 (H)     Home DM Meds: Lantus 12 units BID  Current Orders: Novolog Sensitive Correction Scale/ SSI (0-9 units) Q4 hours    Currently receiving Tube Feeds 50cc/hr.  CBGs remain >200 mg/dl since Midnight.  Takes Lantus as an outpatient.     MD- Please consider the following in-hospital insulin adjustments:   1. Start Lantus 6 units BID (50% total home dose to start)   2. Start Novolog Tube Feed Coverage: Novolog 3 units Q4 hours  HOLD if tube feed HELD for any reason     --Will follow patient during hospitalization--  Wyn Quaker RN, MSN, CDE Diabetes Coordinator Inpatient Glycemic Control Team Team Pager: [phone] (8a-5p)

## 2018-12-22 NOTE — Care Management Important Message (Signed)
Important Message  Patient Details  Name: Shannon Lang MRN: [id number] Date of Birth: 11-30-1945   Medicare Important Message Given:  Yes Due to illness patient was not able to sign.  CMA sign on patient behalf   Orbie Pyo 12/22/2018, 3:32 PM

## 2018-12-22 NOTE — Progress Notes (Signed)
Daily Progress Note   Patient Name: Shannon Lang       Date: 12/22/2018 DOB: 1945-10-20  Age: 73 y.o. MRN#: [id number] Attending Physician: Shannon Ford, DO Primary Care Physician: System, Pcp Not In Admit Date: 12/22/18  Reason for Consultation/Follow-up: Establishing goals of care  Subjective: Reviewed chart.  Received check out from Shannon Lang, PMT NP.  Talked with Dr. Ree Lang.  Examined patient at bedside.  Spoke to daughter Shannon Lang) on the phone.  Originally Shannon Lang wanted to meet at 1:00 pm today.  Later in the morning she called stating she was having car trouble and is unable to get her until later in the week.  I explained that her mother may be back in [place] by then but we will stay in touch just in case she has transportation here to visit her mother and have a [name] conversation.  Assessment: Patient chronic trach / PEG dependent.  Appears to have had a drastic decline since January 2020.  Cardiac arrest in April 2020.  Unstageable wound.  Dementia - no longer speaking more than 1-2 words.   Patient Profile/HPI:  73 y.o. female  with past medical history of cardiac arrest in April, 2020, leading to respiratory failure, intubation, (11/06/2018), PEG (11/11/2018), congestive heart failure, diabetes type 2, sacral decubitus, atrial fib, dementia, COPD, admitted on Dec 22, 2018 with hypotension and worsening kidney function.  Patient has been living at [facility] facility since cardiac arrest in April 2020.  She was admitted on 12/22/2018 after being found to be hypotensive, labs reflecting worsening renal function,  CXR indicates pneumonia.      Length of Stay: 4  Current Medications: Scheduled Meds:  . sodium chloride   Intravenous Once  . chlorhexidine  5 mL  Mouth/Throat BID  . Chlorhexidine Gluconate Cloth  6 each Topical Q0600  . clonazePAM  0.5 mg Per Tube BID  . collagenase   Topical Daily  . fentaNYL  1 patch Transdermal Q72H  . free water  200 mL Per Tube Q8H  . insulin aspart  0-9 Units Subcutaneous Q4H  . levothyroxine  50 mcg Per Tube Daily  . mouth rinse  15 mL Mouth Rinse q12n4p  . mupirocin ointment  1 application Nasal BID  . pantoprazole sodium  40 mg Per Tube Daily  . polyethylene glycol  17  g Per Tube Daily  . risperiDONE  0.5 mg Per Tube QHS  . scopolamine  1 patch Transdermal Q72H  . sodium chloride flush  10-40 mL Intracatheter Q12H    Continuous Infusions: . sodium chloride 50 mL/hr at 12/22/18 1100  . sodium chloride 250 mL (12/21/18 1814)  . ceFEPime (MAXIPIME) IV    . feeding supplement (OSMOLITE 1.5 CAL) 1,000 mL (12/21/18 1810)    PRN Meds: sodium chloride, acetaminophen **OR** acetaminophen, albuterol, ondansetron **OR** ondansetron (ZOFRAN) IV, sodium chloride flush  Physical Exam        Elderly female, obese, awake, alert.  Will look at me and nod to some questions.  Raises her hand and grimaces. CV rrr  Resp trach in place, mouth open, no respiratory distress Abdomen distended but not tight, PEG in place with no sign of erythema or drainage Lower ext trace edema.  Vital Signs: BP 131/62   Pulse 84   Temp 98.8 F (37.1 C) (Oral)   Resp 13   Ht 5\' 5"  (1.651 m)   Wt 104.8 kg   LMP  (LMP Unknown)   SpO2 97%   BMI 38.45 kg/m  SpO2: SpO2: 97 % O2 Device: O2 Device: Tracheostomy Collar O2 Flow Rate: O2 Flow Rate (L/min): 5 L/min  Intake/output summary:   Intake/Output Summary (Last 24 hours) at 12/22/2018 1333 Last data filed at 12/22/2018 1100 Gross per 24 hour  Intake 1200 ml  Output 1500 ml  Net -300 ml   LBM: Last BM Date: 12/22/18 Baseline Weight: Weight: 113.9 kg Most recent weight: Weight: 104.8 kg       Palliative Assessment/Data: 20%      Patient Active Problem List    Diagnosis Date Noted  . Tracheostomy status (HCC)   . Respiratory failure (HCC)   . Goals of care, counseling/discussion   . Palliative care by specialist   . AKI (acute kidney injury) (HCC) 2018-12-04  . Dementia (HCC) 2018-12-04  . DM2 (diabetes mellitus, type 2) (HCC) 2018-12-04  . Fever 2018-12-04  . Hyperkalemia 2018-12-04  . Sacral decubitus ulcer 2018-12-04  . Acute on chronic respiratory failure with hypoxia (HCC)   . Aspiration pneumonia due to gastric secretions (HCC)   . A-fib (HCC)   . Chronic congestive heart failure with left ventricular diastolic dysfunction (HCC)   . COPD, severe [facility](HCC)     Palliative Care Plan    Recommendations/Plan:  Will stay in touch with daughter.  Telephone GOC meeting had yesterday.  No decisions made because Dtr needed to see her mother.  Will have GOC if dtr can come in.  If patient returns to [facility] please ask Palliative to follow for GOC  there.  Goals of Care and Additional Recommendations:  Limitations on Scope of Treatment: Full Scope Treatment  Code Status:  Full code  Prognosis:   Unable to determine likely weeks to months given over all frailty, trach/peg dependent, recurrent anemia, dementia, unstageable wound   Discharge Planning:  [facility] with Palliative if available.  Care plan was discussed with Dr. Catha GosselinMikhail.  Thank you for allowing the Palliative Medicine Team to assist in the care of this patient.  Total time spent:  35 min.     Greater than 50%  of this time was spent counseling and coordinating care related to the above assessment and plan.  Norvel RichardsMarianne Zephaniah Enyeart, PA-C Palliative Medicine  Please contact Palliative MedicineTeam phone at [phone] for questions and concerns between 7 am - 7 pm.   Please see AMION  for individual provider pager numbers.

## 2018-12-22 NOTE — Progress Notes (Signed)
PROGRESS NOTE    Shannon NianSheila Cilento  ZOX:[id number] DOB: 10/12/1945 DOA: Sep 04, 2018 PCP: System, Pcp Not In   Brief Narrative:  HPI on Sep 04, 2018 by Dr. Lyda PeroneJared Gardner Shannon Lang is a 73 y.o. female with medical history significant of dementia, COPD, Respiratory failure requiring intubation on 4/16, also apparently cardiac arrest requiring CPR.  Patient s/p tracheostomy (4/30) and PEG placement (5/15).  Patient resides at [facility] since that time.  Patient has been having fever, increasing O2 requirement at [facility].  Was apparently started on cefepime and vanc there recently.  Today apparently also had AKF and hypotension which prompted them to send her in to the ED.  Interim history Admitted for AKI, acute on chronic resp failure secondary to poss pneumonia. Nephrology, PCCM, palliative care consulted.  Assessment & Plan   Acute kidney injury with hyperkalemia -Creatinine on admission 2.28, baseline unknown. Currently 1.68 -Nephrology consulted and appreciated, feels patient is not a candidate for long-term dialysis.  Also feels that her creatinine and GFR may be a lot worse than the calculated GFR due to poor muscle mass.  Patient's azotemia is out of proportion to her increase in creatinine. -Renal ultrasound: Difficult visualization of kidneys due to habitus.  Negative for hydronephrosis.  Mild renal cortical thinning. -Continue IV fluids- rate decreased today -patient was given lokelma for hyperkalemia along with albuterol, insulin and glucose, calcium gluconate, IV bicarb -Potassium 5 today -Continue to monitor BMP  Acute on chronic respiratory failure with hypoxia with trach -patient with tracheostomy  -PCCM consulted and appreciated -currently on 5L [place]  RLL, suspect HCAP -Supposedly per PCCM note, patient had fever of 100, however patient was mildly hypothermic with of 96.25F -CXR shows stable right basilar atelectasis vs infiltrate -no leukocytosis, fever, or tachycarida  noted -lactic acid normal -was placed on vancomycin and cefepime -PCCM added urine strep pneumoniae and legionella antigens -urine Strep pneumo Ag negative -Respiratory culture: Pseudomonas aeruginosa - given culture results, will discontinue vancomycin -COVID negative  Hypernatremia/hyperchloremia -Cchanged IVF to half normal -Sodium improving, down to 138, Chloride down to 107 -PCCM ordered free water  -continue to monitor BMP  Acute normocytic anemia/ Symptomatic anemia/ ?GU bleeding  -hemoglobin down to 7.5 today -possibly dilutional as patient has been on IVF -FOBT negative -Anemia panel shows adequate iron and storage -appears bleeding is vaginal/GU in origin -Transvaginal US: limited due to body habits. Exophytic 7.6cm left fundal fibroid. No visible acute process -Discussed with Dr. Macon LargeAnyanwu (Ob/gyn), recommended megace 80mg  BID until bleeding subsides. Patient is not a candidate for hormonal type of treatment or invasive procedures. Ultimately she would need an endometrial biopsy to further investigate cause of bleeding.  -discontinued lovenox -Continue to monitor H/H -given respiratory failure and increased work of breathing, will transfuse 1u PRBC  PAF -metoprolol currently held -currently in sinus rhythm  NSVT -Patient had an 11beat run of VT overnight -Potassium 5, magnesium 2.1 -will continue to monitor   Chronic diastolic congestive heart failure -per medical history -Appears euvolemic and compensated -monitor intake/output, daily weights -Echocardiogram EF 55-60%, LV diastolic parameters are indeterminate  Diabetes mellitus, type 2 -Continue ISS and CBG monitoring  Dementia  -Continue klonopin, risperdal  Sacral decubitus ulcer -stage 3-4 -present on admission -Wound care consulted  Goals of care -patient with recent history of arrest, tracheostomy  -Currently full code -Palliative care consulted and appreciated -had an extensive conversation  with daughter via phone- feel it would be beneficial for her to come and visit her mother  DVT Prophylaxis  Lovenox --> SCDs  Code Status: Full  Family Communication: None at bedside. Daughter via phone  Disposition Plan: Admitted. Continue to monitor, treat poss pneumonia. Pending improvement respiratory wise, bleeding and stabilization. Suspect LTAC on discharge  Consultants PCCM Nephrology  Palliative care  Procedures  US Renal Transvaginal US  Antibiotics   Anti-infectives (From admission, onward)   Start     Dose/Rate Route Frequency Ordered Stop   12/20/18 0830  vancomycin (VANCOCIN) 1,500 mg in sodium chloride 0.9 % 500 mL IVPB     1,500 mg 250 mL/hr over 120 Minutes Intravenous Every 48 hours 12/20/18 0827     12/19/18 1900  ceFEPIme (MAXIPIME) 2 g in sodium chloride 0.9 % 100 mL IVPB     2 g 200 mL/hr over 30 Minutes Intravenous Every 24 hours 12/29/2018 2217     12/19/18 0800  ceFEPIme (MAXIPIME) 2 g in sodium chloride 0.9 % 100 mL IVPB  Status:  Discontinued     2 g 200 mL/hr over 30 Minutes Intravenous Every 12 hours 12/29/2018 2026 01/06/2019 2217   12/10/2018 2211  vancomycin variable dose per unstable renal function (pharmacist dosing)  Status:  Discontinued      Does not apply See admin instructions 12/21/2018 2211 12/20/18 0827   01/06/2019 1845  ceFEPIme (MAXIPIME) 2 g in sodium chloride 0.9 % 100 mL IVPB     2 g 200 mL/hr over 30 Minutes Intravenous  Once 12/15/2018 1838 12/15/2018 2034      Subjective:   Shannon Lang seen and examined today.  Patient with dementia. Nonverbal, able to mouth "hi".   Objective:   Vitals:   12/21/18 2330 12/22/18 0300 12/22/18 0414 12/22/18 0756  BP: 118/66 (!) 146/75  (!) 124/53  Pulse: 68 73 73 72  Resp: Temp: 98.4 F (36.9 C) 98.6 F (37 C)  98.8 F (37.1 C)  TempSrc: Oral Oral  Oral  SpO2: 97% 97% 97% 98%  Weight:  104.8 kg    Height:        Intake/Output Summary (Last 24 hours) at 12/22/2018 0836 Last  data filed at 12/22/2018 0500 Gross per 24 hour  Intake 2329.97 ml  Output 1250 ml  Net 1079.97 ml   Filed Weights   12/19/18 0030 12/20/18 0348 12/22/18 0300  Weight: 94.4 kg 100.2 kg 104.8 kg   Exam  General: Well developed, acutely and chronically ill appearing, NAD  HEENT: NCAT, mucous membranes moist.   Neck: Trach  Cardiovascular: S1 S2 auscultated, RRR  Respiratory: Diminished breath sounds anteriorly   Abdomen: Soft, obese, nontender, nondistended, + bowel sounds, peg  Extremities: warm dry without cyanosis clubbing  Neuro: Difficult to assess, h/o dementia.   Psych: Cannot fully assess   Data Reviewed: I have personally reviewed following labs and imaging studies  CBC: Recent Labs  Lab 12/28/2018 1834 12/19/18 0742 12/20/18 0637 12/21/18 0624 12/22/18 0637  WBC 10.6* 9.7 11.0* 9.4 9.7  NEUTROABS 8.3*  --   --   --  7.4  HGB 10.2* 9.1* 8.6* 7.6* 7.5*  HCT 35.4* 31.8* 29.4* 25.4* 24.8*  MCV 95.4 94.1 92.2 91.4 90.5  PLT 217 196 173 166 175   Basic Metabolic Panel: Recent Labs  Lab 12/28/2018 1834 12/13/2018 2115 12/19/18 0742 12/19/18 1857 12/20/18 0637 12/20/18 1709 12/21/18 0624 12/22/18 0637  NA 143  --  148*  --  139  --  137 138  K 6.5* 6.2* 5.7*  --  4.8  --  4.5 5.0  CL 111  --  116*  --  109  --  106 107  CO2 23  --  28  --  23  --  24 23  GLUCOSE 354*  --  153*  --  286*  --  294* 240*  BUN 161*  --  145*  --  119*  --  91* 81*  CREATININE 2.28*  --  2.19*  --  1.92*  --  1.65* 1.68*  CALCIUM 10.2  --  10.0  --  8.9  --  8.4* 8.5*  MG 3.1*  --  3.1* 2.7* 2.5* 2.3  --  2.1  PHOS 2.9  --  3.1  --  2.6  --  2.9 2.9   GFR: Estimated Creatinine Clearance: 37.4 mL/min (A) (by C-G formula based on SCr of 1.68 mg/dL (H)). Liver Function Tests: Recent Labs  Lab 12/15/2018 1834 12/19/18 0742 12/20/18 [id number] 12/21/18 0624 12/22/18 0637  AST 32  --   --   --   --   ALT 43  --   --   --   --   ALKPHOS 131*  --   --   --   --   BILITOT 0.3  --    --   --   --   PROT 7.0  --   --   --   --   ALBUMIN 1.7* 1.7* 1.5* 1.3* 1.3*   No results for input(s): LIPASE, AMYLASE in the last 168 hours. No results for input(s): AMMONIA in the last 168 hours. Coagulation Profile: Recent Labs  Lab 12/22/2018 1834  INR 1.1   Cardiac Enzymes: No results for input(s): CKTOTAL, CKMB, CKMBINDEX, TROPONINI in the last 168 hours. BNP (last 3 results) No results for input(s): PROBNP in the last 8760 hours. HbA1C: No results for input(s): HGBA1C in the last 72 hours. CBG: Recent Labs  Lab 12/20/18 1137 12/20/18 1720 12/20/18 1938 12/20/18 2351 12/21/18 0419  GLUCAP 288* 182* 196* 260* 277*   Lipid Profile: No results for input(s): CHOL, HDL, LDLCALC, TRIG, CHOLHDL, LDLDIRECT in the last 72 hours. Thyroid Function Tests: No results for input(s): TSH, T4TOTAL, FREET4, T3FREE, THYROIDAB in the last 72 hours. Anemia Panel: Recent Labs    12/21/18 1056  VITAMINB12 639  FOLATE 10.6  FERRITIN 242  TIBC 134*  IRON 31  RETICCTPCT 1.3   Urine analysis:    Component Value Date/Time   COLORURINE RED (A) 12/19/2018 2020   APPEARANCEUR TURBID (A) 12/19/2018 2020   LABSPEC NOT DONE 12/19/2018 2020   PHURINE NOT DONE 12/19/2018 2020   GLUCOSEU (A) 12/19/2018 2020    TEST NOT REPORTED DUE TO COLOR INTERFERENCE OF URINE PIGMENT   HGBUR (A) 12/19/2018 2020    TEST NOT REPORTED DUE TO COLOR INTERFERENCE OF URINE PIGMENT   BILIRUBINUR (A) 12/19/2018 2020    TEST NOT REPORTED DUE TO COLOR INTERFERENCE OF URINE PIGMENT   KETONESUR (A) 12/19/2018 2020    TEST NOT REPORTED DUE TO COLOR INTERFERENCE OF URINE PIGMENT   PROTEINUR (A) 12/19/2018 2020    TEST NOT REPORTED DUE TO COLOR INTERFERENCE OF URINE PIGMENT   NITRITE (A) 12/19/2018 2020    TEST NOT REPORTED DUE TO COLOR INTERFERENCE OF URINE PIGMENT   LEUKOCYTESUR (A) 12/19/2018 2020    TEST NOT REPORTED DUE TO COLOR INTERFERENCE OF URINE PIGMENT   Sepsis Labs:  @LABRCNTIP (procalcitonin:4,lacticidven:4)  ) Recent Results (from the past 240 hour(s))  Blood Culture (routine x 2)  Status: None (Preliminary result)   Collection Time: 10-31-2018  5:41 PM   Specimen: BLOOD LEFT HAND  Result Value Ref Range Status   Specimen Description BLOOD LEFT HAND  Final   Special Requests   Final    BOTTLES DRAWN AEROBIC AND ANAEROBIC Blood Culture adequate volume   Culture   Final    NO GROWTH 3 DAYS Performed at [facility], [address] [place], [place] [address]    Report Status PENDING  Incomplete  SARS Coronavirus 2     Status: None   Collection Time: 10-31-2018  8:39 PM  Result Value Ref Range Status   SARS Coronavirus 2 NOT DETECTED NOT DETECTED Final    Comment: (NOTE) SARS-CoV-2 target nucleic acids are NOT DETECTED. The SARS-CoV-2 RNA is generally detectable in upper and lower respiratory specimens during the acute phase of infection.  Negative  results do not preclude SARS-CoV-2 infection, do not rule out co-infections with other pathogens, and should not be used as the sole basis for treatment or other patient management decisions.  Negative results must be combined with clinical observations, patient history, and epidemiological information. The expected result is Not Detected. Fact Sheet for Patients: http://www.biofiredefense.com/wp-content/uploads/2020/03/BIOFIRE-COVID -19-patients.pdf Fact Sheet for Healthcare Providers: http://www.biofiredefense.com/wp-content/uploads/2020/03/BIOFIRE-COVID -19-hcp.pdf This test is not yet approved or cleared by the [place] FDA and  has been authorized for detection and/or diagnosis of SARS-CoV-2 by FDA under an Emergency Use Authorization (EUA).  This EUA will remain in effec t (meaning this test can be used) for the duration of  the COVID-19 declaration under Section 564(b)(1) of the Act, 21 U.S.C. section 360bbb-3(b)(1), unless the authorization is terminated or revoked sooner.  Performed at [facility], [address] [place], [place] [address]   Expectorated sputum assessment w rflx to resp cult     Status: None   Collection Time: 10-31-2018  9:02 PM   Specimen: Tracheal Aspirate; Sputum  Result Value Ref Range Status   Specimen Description TRACHEAL ASPIRATE  Final   Special Requests NONE  Final   Sputum evaluation   Final    THIS SPECIMEN IS ACCEPTABLE FOR SPUTUM CULTURE Performed at [facility], [address] [place], [place] [address]    Report Status 12/19/2018 FINAL  Final  Culture, respiratory     Status: None   Collection Time: 10-31-2018  9:02 PM   Specimen: Tracheal Aspirate  Result Value Ref Range Status   Specimen Description TRACHEAL ASPIRATE  Final   Special Requests NONE Reflexed from [id number]  Final   Gram Stain   Final    ABUNDANT WBC PRESENT, PREDOMINANTLY PMN FEW SQUAMOUS EPITHELIAL CELLS PRESENT RARE GRAM NEGATIVE RODS Performed at [facility], [address] [place], [place] [address]    Culture FEW PSEUDOMONAS AERUGINOSA  Final   Report Status 12/21/2018 FINAL  Final   Organism ID, Bacteria PSEUDOMONAS AERUGINOSA  Final      Susceptibility   Pseudomonas aeruginosa - MIC*    CEFTAZIDIME 8 SENSITIVE Sensitive     CIPROFLOXACIN >=4 RESISTANT Resistant     GENTAMICIN <=1 SENSITIVE Sensitive     IMIPENEM 1 SENSITIVE Sensitive     PIP/TAZO 64 SENSITIVE Sensitive     CEFEPIME 4 SENSITIVE Sensitive     * FEW PSEUDOMONAS AERUGINOSA  MRSA PCR Screening     Status: Abnormal   Collection Time: 12/19/18  2:28 AM   Specimen: Nasal Mucosa; Nasopharyngeal  Result Value Ref Range Status  MRSA by PCR POSITIVE (A) NEGATIVE Final    Comment:        The GeneXpert MRSA Assay (FDA approved for NASAL specimens only), is one component of a comprehensive MRSA colonization surveillance program. It is not intended to diagnose MRSA infection nor to guide or monitor treatment for MRSA infections. RESULT CALLED TO, READ BACK  BY AND VERIFIED WITH: RN M DEBASTIANI @0632  12/19/18 BY S GEZAHEGN Performed at [facility], [address], [place], [place] [address]   Blood Culture (routine x 2)     Status: None (Preliminary result)   Collection Time: 12/19/18  7:42 AM   Specimen: BLOOD  Result Value Ref Range Status   Specimen Description BLOOD LEFT ANTECUBITAL  Final   Special Requests   Final    BOTTLES DRAWN AEROBIC ONLY Blood Culture adequate volume   Culture   Final    NO GROWTH 2 DAYS Performed at [facility], [place] [address] [place], [place] [address]    Report Status PENDING  Incomplete      Radiology Studies: US Pelvis (transabdominal Only)  Result Date: 12/21/2018 CLINICAL DATA:  Vaginal bleeding EXAM: TRANSABDOMINAL ULTRASOUND OF PELVIS TECHNIQUE: Transabdominal ultrasound examination of the pelvis was performed including evaluation of the uterus, ovaries, adnexal regions, and pelvic cul-de-sac. COMPARISON:  None FINDINGS: Uterus Measurements: 11.2 x 6.7 x 7.1 cm = volume: 275 mL. Exophytic fundal fibroid noted measuring up to 7.6 cm. Endometrium Thickness: Not well visualized. Right ovary Measurements: 3.4 x 1.4 x 2.3 cm = volume: 7.7 mL. Normal appearance/no adnexal mass. Left ovary Measurements: Not visualized.  No adnexal mass seen. Other findings:  No abnormal free fluid. IMPRESSION: Limited study by body habitus and lack of bladder distention with Foley catheter in place. There appears to be an exophytic 7.6 cm left fundal fibroid. No visible acute process. Electronically Signed   By: Rolm Baptise M.D.   On: 12/21/2018 19:26     Scheduled Meds: . sodium chloride   Intravenous Once  . chlorhexidine  5 mL Mouth/Throat BID  . clonazePAM  0.5 mg Per Tube BID  . collagenase   Topical Daily  . fentaNYL  1 patch Transdermal Q72H  . free water  200 mL Per Tube Q8H  . insulin aspart  0-9 Units Subcutaneous Q4H  . levothyroxine  50 mcg Per Tube Daily  . mouth rinse  15 mL Mouth Rinse  [id number]  . pantoprazole sodium  40 mg Per Tube Daily  . polyethylene glycol  17 g Per Tube Daily  . risperiDONE  0.5 mg Per Tube QHS  . scopolamine  1 patch Transdermal Q72H  . sodium chloride flush  10-40 mL Intracatheter Q12H   Continuous Infusions: . sodium chloride 50 mL/hr at 12/21/18 1811  . sodium chloride 250 mL (12/21/18 1814)  . ceFEPime (MAXIPIME) IV 2 g (12/21/18 1820)  . feeding supplement (OSMOLITE 1.5 CAL) 1,000 mL (12/21/18 1810)  . vancomycin 1,500 mg (12/20/18 0900)     LOS: 4 days   Time Spent in minutes   45 minutes  Natale Barba D.O. on 12/22/2018 at 8:36 AM  Between 7am to 7pm - Please see pager noted on [email or web address]  After 7pm go to [email or web address]  And look for the night coverage person covering for me after hours  [facility] Office  [phone]

## 2018-12-23 LAB — BPAM RBC
Blood Product Expiration Date: 202006202359
ISSUE DATE / TIME: 202006151726
Unit Type and Rh: 5100

## 2018-12-23 LAB — CULTURE, BLOOD (ROUTINE X 2)
Culture: NO GROWTH
Special Requests: ADEQUATE

## 2018-12-23 LAB — CBC
HCT: 28.6 % — ABNORMAL LOW (ref 36.0–46.0)
Hemoglobin: 8.8 g/dL — ABNORMAL LOW (ref 12.0–15.0)
MCH: 27.8 pg (ref 26.0–34.0)
MCHC: 30.8 g/dL (ref 30.0–36.0)
MCV: 90.2 fL (ref 80.0–100.0)
Platelets: 198 10*3/uL (ref 150–400)
RBC: 3.17 MIL/uL — ABNORMAL LOW (ref 3.87–5.11)
RDW: 16.5 % — ABNORMAL HIGH (ref 11.5–15.5)
WBC: 11.8 10*3/uL — ABNORMAL HIGH (ref 4.0–10.5)
nRBC: 0 % (ref 0.0–0.2)

## 2018-12-23 LAB — RENAL FUNCTION PANEL
Albumin: 1.4 g/dL — ABNORMAL LOW (ref 3.5–5.0)
Anion gap: 6 (ref 5–15)
BUN: 69 mg/dL — ABNORMAL HIGH (ref 8–23)
CO2: 24 mmol/L (ref 22–32)
Calcium: 8.7 mg/dL — ABNORMAL LOW (ref 8.9–10.3)
Chloride: 108 mmol/L (ref 98–111)
Creatinine, Ser: 1.68 mg/dL — ABNORMAL HIGH (ref 0.44–1.00)
GFR calc Af Amer: 35 mL/min — ABNORMAL LOW (ref >60)
GFR calc non Af Amer: 30 mL/min — ABNORMAL LOW (ref >60)
Glucose, Bld: 289 mg/dL — ABNORMAL HIGH (ref 70–99)
Phosphorus: 2.8 mg/dL (ref 2.5–4.6)
Potassium: 5.4 mmol/L — ABNORMAL HIGH (ref 3.5–5.1)
Sodium: 138 mmol/L (ref 135–145)

## 2018-12-23 LAB — GLUCOSE, CAPILLARY
Glucose-Capillary: 127 mg/dL — ABNORMAL HIGH (ref 70–99)
Glucose-Capillary: 226 mg/dL — ABNORMAL HIGH (ref 70–99)
Glucose-Capillary: 239 mg/dL — ABNORMAL HIGH (ref 70–99)
Glucose-Capillary: 245 mg/dL — ABNORMAL HIGH (ref 70–99)
Glucose-Capillary: 250 mg/dL — ABNORMAL HIGH (ref 70–99)
Glucose-Capillary: 258 mg/dL — ABNORMAL HIGH (ref 70–99)
Glucose-Capillary: 274 mg/dL — ABNORMAL HIGH (ref 70–99)

## 2018-12-23 LAB — TYPE AND SCREEN
ABO/RH(D): O POS
Antibody Screen: NEGATIVE
Unit division: 0

## 2018-12-23 MED ORDER — SODIUM ZIRCONIUM CYCLOSILICATE 5 G PO PACK
5.0000 g | PACK | Freq: Once | ORAL | Status: AC
  Administered 2018-12-23: 5 g via ORAL
  Filled 2018-12-23: qty 1

## 2018-12-23 MED ORDER — INSULIN ASPART 100 UNIT/ML ~~LOC~~ SOLN
4.0000 [IU] | SUBCUTANEOUS | Status: DC
  Administered 2018-12-23 – 2018-12-24 (×8): 4 [IU] via SUBCUTANEOUS

## 2018-12-23 MED ORDER — INSULIN GLARGINE 100 UNIT/ML ~~LOC~~ SOLN
6.0000 [IU] | Freq: Two times a day (BID) | SUBCUTANEOUS | Status: DC
  Administered 2018-12-23 – 2018-12-24 (×3): 6 [IU] via SUBCUTANEOUS
  Filled 2018-12-23 (×4): qty 0.06

## 2018-12-23 NOTE — Progress Notes (Signed)
PROGRESS NOTE    Shannon NianSheila Hacking  WUJ:[id number] DOB: 10/21/1945 DOA: 12/30/2018 PCP: System, Pcp Not In   Brief Narrative:  HPI on 12/28/2018 by Dr. Lyda PeroneJared Lang Shannon Lang is a 73 y.o. female with medical history significant of dementia, COPD, Respiratory failure requiring intubation on 4/16, also apparently cardiac arrest requiring CPR.  Patient s/p tracheostomy (4/30) and PEG placement (5/15).  Patient resides at [facility] since that time.  Patient has been having fever, increasing O2 requirement at [facility].  Was apparently started on cefepime and vanc there recently.  Today apparently also had AKF and hypotension which prompted them to send her in to the ED.  Interim history Admitted for AKI, acute on chronic resp failure secondary to poss pneumonia. Nephrology, PCCM, palliative care consulted.  Assessment & Plan   Acute kidney injury with hyperkalemia -Creatinine on admission 2.28, baseline unknown. Currently 1.68 -Nephrology consulted and appreciated, feels patient is not a candidate for long-term dialysis.  Also feels that her creatinine and GFR may be a lot worse than the calculated GFR due to poor muscle mass.  Patient's azotemia is out of proportion to her increase in creatinine. -Renal ultrasound: Difficult visualization of kidneys due to habitus.  Negative for hydronephrosis.  Mild renal cortical thinning. -Continue IV fluids- rate decreased today -patient was given lokelma for hyperkalemia along with albuterol, insulin and glucose, calcium gluconate, IV bicarb -Potassium 5.4 today, will restart lokelma and continue to monitor  -Continue to monitor BMP  Acute on chronic respiratory failure with hypoxia with trach -patient with tracheostomy  -PCCM consulted and appreciated -currently on 5L [place]  RLL, suspect HCAP -Supposedly per PCCM note, patient had fever of 100, however patient was mildly hypothermic with of 96.40F -CXR shows stable right basilar atelectasis vs  infiltrate -no leukocytosis, fever, or tachycarida noted -lactic acid normal -was placed on vancomycin and cefepime -PCCM added urine strep pneumoniae and legionella antigens -urine Strep pneumo Ag negative -Respiratory culture: Pseudomonas aeruginosa - given culture results, vancomycin discontinued  -COVID negative  Hypernatremia/hyperchloremia -Cchanged IVF to half normal -Sodium improving, down to 138, Chloride down to 108 -PCCM ordered free water  -continue to monitor BMP  Acute normocytic anemia/ Symptomatic anemia/ ?GU bleeding  -hemoglobin down dropped to 7.5 (suspect baseline 9-10)  -possibly dilutional as patient has been on IVF -FOBT negative -Anemia panel shows adequate iron and storage -appears bleeding is vaginal/GU in origin -Transvaginal US: limited due to body habits. Exophytic 7.6cm left fundal fibroid. No visible acute process -Discussed with Dr. Macon LargeAnyanwu (Ob/gyn), recommended megace 80mg  BID until bleeding subsides. Patient is not a candidate for hormonal type of treatment or invasive procedures. Ultimately she would need an endometrial biopsy to further investigate cause of bleeding.  -discontinued lovenox -Given 1u PRBC on 12/22/2018- hemoglobin up to 8.8 -Given her immobility, feel that placing patient on Megace may place her at increased risk of thromboembolism  -Continue to monitor H/H and for GU bleeding  PAF -metoprolol currently held -currently in sinus rhythm  NSVT -Patient had an 11beat run of VT overnight -Potassium >5, magnesium 2.1 -will continue to monitor   Chronic diastolic congestive heart failure -per medical history -Appears euvolemic and compensated -monitor intake/output, daily weights -Echocardiogram EF 55-60%, LV diastolic parameters are indeterminate  Diabetes mellitus, type 2 -Continue ISS and CBG monitoring -restarted lantus 6u BID, added novolog 3u q4h for better control  Dementia  -Continue klonopin, risperdal  Sacral  decubitus ulcer -stage 3-4 -present on admission -Wound care consulted  Goals of  care -patient with recent history of arrest, tracheostomy  -Currently full code -Palliative care consulted and appreciated -had an extensive conversation with daughter via phone- feel it would be beneficial for her to come and visit her mother -Daughter was supposed to come to see her mother on 12/22/2018 however did not. She informed palliative that it may be the weekend. -patient may be discharged back to [facility] with palliative care to follow   DVT Prophylaxis  Lovenox --> SCDs  Code Status: Full  Family Communication: None at bedside. Attempted to reach daughter via phone- no answer  Disposition Plan: Admitted. Continue to monitor for further bleeding. If hemoglobin stable, likely discharge back to LTAC in 1-2 days.   Consultants PCCM Nephrology  Palliative care  Procedures  US Renal Transvaginal US  Antibiotics   Anti-infectives (From admission, onward)   Start     Dose/Rate Route Frequency Ordered Stop   12/22/18 1200  ceFEPIme (MAXIPIME) 2 g in sodium chloride 0.9 % 100 mL IVPB     2 g 200 mL/hr over 30 Minutes Intravenous Every 12 hours 12/22/18 1112     12/20/18 0830  vancomycin (VANCOCIN) 1,500 mg in sodium chloride 0.9 % 500 mL IVPB  Status:  Discontinued     1,500 mg 250 mL/hr over 120 Minutes Intravenous Every 48 hours 12/20/18 0827 12/22/18 0841   12/19/18 1900  ceFEPIme (MAXIPIME) 2 g in sodium chloride 0.9 % 100 mL IVPB  Status:  Discontinued     2 g 200 mL/hr over 30 Minutes Intravenous Every 24 hours 12-22-2018 2217 12/22/18 1112   12/19/18 0800  ceFEPIme (MAXIPIME) 2 g in sodium chloride 0.9 % 100 mL IVPB  Status:  Discontinued     2 g 200 mL/hr over 30 Minutes Intravenous Every 12 hours December 22, 2018 2026 12-22-2018 2217   December 22, 2018 2211  vancomycin variable dose per unstable renal function (pharmacist dosing)  Status:  Discontinued      Does not apply See admin instructions December 22, 2018  2211 12/20/18 0827   2018-12-22 1845  ceFEPIme (MAXIPIME) 2 g in sodium chloride 0.9 % 100 mL IVPB     2 g 200 mL/hr over 30 Minutes Intravenous  Once 12/22/18 1838 2018-12-22 2034      Subjective:   Kjersti Dittmer seen and examined today.  Patient with dementia. Nonverbal, able to mouth "hi".   Objective:   Vitals:   12/22/18 2340 12/23/18 0302 12/23/18 0333 12/23/18 0807  BP: (!) 142/66  139/61 (!) 118/50  Pulse: 74 76 69 75  Resp: Temp: 98.7 F (37.1 C)  98.6 F (37 C) 98.6 F (37 C)  TempSrc: Axillary  Axillary Axillary  SpO2: 97% 98% 97% 96%  Weight:      Height:        Intake/Output Summary (Last 24 hours) at 12/23/2018 1007 Last data filed at 12/23/2018 0507 Gross per 24 hour  Intake 1115 ml  Output 1250 ml  Net -135 ml   Filed Weights   12/19/18 0030 12/20/18 0348 12/22/18 0300  Weight: 94.4 kg 100.2 kg 104.8 kg   Exam  General: Well developed, acutely and chronically ill appearing, NAD  HEENT: NCAT, mucous membranes moist.   Neck: Trach  Cardiovascular: S1 S2 auscultated, RRR  Respiratory: Diminished breath sounds, anteriorly   Abdomen: Soft, obese, nontender, nondistended, + bowel sounds, peg  Extremities: warm dry without cyanosis clubbing or edema  Neuro: Aawake and alert, cannot assess orientation. Only states "hi", moves all extremities,  but does not follow commands  Psych: Cannot fully assess  Data Reviewed: I have personally reviewed following labs and imaging studies  CBC: Recent Labs  Lab 12/27/2018 1834 12/19/18 0742 12/20/18 0637 12/21/18 0624 12/22/18 0637 12/23/18 0622  WBC 10.6* 9.7 11.0* 9.4 9.7 11.8*  NEUTROABS 8.3*  --   --   --  7.4  --   HGB 10.2* 9.1* 8.6* 7.6* 7.5* 8.8*  HCT 35.4* 31.8* 29.4* 25.4* 24.8* 28.6*  MCV 95.4 94.1 92.2 91.4 90.5 90.2  PLT 217 196 173 166 175 831   Basic Metabolic Panel: Recent Labs  Lab 12/19/18 0742 12/19/18 1857 12/20/18 0637 12/20/18 1709 12/21/18 0624 12/22/18 0637  12/23/18 0622  NA 148*  --  139  --  137 138 138  K 5.7*  --  4.8  --  4.5 5.0 5.4*  CL 116*  --  109  --  106 107 108  CO2 28  --  23  --  24 23 24   GLUCOSE 153*  --  286*  --  294* 240* 289*  BUN 145*  --  119*  --  91* 81* 69*  CREATININE 2.19*  --  1.92*  --  1.65* 1.68* 1.68*  CALCIUM 10.0  --  8.9  --  8.4* 8.5* 8.7*  MG 3.1* 2.7* 2.5* 2.3  --  2.1  --   PHOS 3.1  --  2.6  --  2.9 2.9 2.8   GFR: Estimated Creatinine Clearance: 37.4 mL/min (A) (by C-G formula based on SCr of 1.68 mg/dL (H)). Liver Function Tests: Recent Labs  Lab 12/12/2018 1834 12/19/18 0742 12/20/18 5176 12/21/18 0624 12/22/18 0637 12/23/18 0622  AST 32  --   --   --   --   --   ALT 43  --   --   --   --   --   ALKPHOS 131*  --   --   --   --   --   BILITOT 0.3  --   --   --   --   --   PROT 7.0  --   --   --   --   --   ALBUMIN 1.7* 1.7* 1.5* 1.3* 1.3* 1.4*   No results for input(s): LIPASE, AMYLASE in the last 168 hours. No results for input(s): AMMONIA in the last 168 hours. Coagulation Profile: Recent Labs  Lab 12/21/2018 1834  INR 1.1   Cardiac Enzymes: No results for input(s): CKTOTAL, CKMB, CKMBINDEX, TROPONINI in the last 168 hours. BNP (last 3 results) No results for input(s): PROBNP in the last 8760 hours. HbA1C: No results for input(s): HGBA1C in the last 72 hours. CBG: Recent Labs  Lab 12/22/18 1642 12/22/18 1924 12/23/18 0002 12/23/18 0342 12/23/18 0810  GLUCAP 263* 248* 239* 226* 274*   Lipid Profile: No results for input(s): CHOL, HDL, LDLCALC, TRIG, CHOLHDL, LDLDIRECT in the last 72 hours. Thyroid Function Tests: No results for input(s): TSH, T4TOTAL, FREET4, T3FREE, THYROIDAB in the last 72 hours. Anemia Panel: Recent Labs    12/21/18 1056  VITAMINB12 639  FOLATE 10.6  FERRITIN 242  TIBC 134*  IRON 31  RETICCTPCT 1.3   Urine analysis:    Component Value Date/Time   COLORURINE RED (A) 12/19/2018 2020   APPEARANCEUR TURBID (A) 12/19/2018 2020   LABSPEC NOT  DONE 12/19/2018 2020   PHURINE NOT DONE 12/19/2018 2020   GLUCOSEU (A) 12/19/2018 2020    TEST NOT REPORTED DUE TO  COLOR INTERFERENCE OF URINE PIGMENT   HGBUR (A) 12/19/2018 2020    TEST NOT REPORTED DUE TO COLOR INTERFERENCE OF URINE PIGMENT   BILIRUBINUR (A) 12/19/2018 2020    TEST NOT REPORTED DUE TO COLOR INTERFERENCE OF URINE PIGMENT   KETONESUR (A) 12/19/2018 2020    TEST NOT REPORTED DUE TO COLOR INTERFERENCE OF URINE PIGMENT   PROTEINUR (A) 12/19/2018 2020    TEST NOT REPORTED DUE TO COLOR INTERFERENCE OF URINE PIGMENT   NITRITE (A) 12/19/2018 2020    TEST NOT REPORTED DUE TO COLOR INTERFERENCE OF URINE PIGMENT   LEUKOCYTESUR (A) 12/19/2018 2020    TEST NOT REPORTED DUE TO COLOR INTERFERENCE OF URINE PIGMENT   Sepsis Labs: (procalcitonin:4,lacticidven:4)  ) Recent Results (from the past 240 hour(s))  Blood Culture (routine x 2)     Status: None (Preliminary result)   Collection Time: 12/25/2018  5:41 PM   Specimen: BLOOD LEFT HAND  Result Value Ref Range Status   Specimen Description BLOOD LEFT HAND  Final   Special Requests   Final    BOTTLES DRAWN AEROBIC AND ANAEROBIC Blood Culture adequate volume   Culture   Final    NO GROWTH 3 DAYS Performed at [facility], [address], [place], [place] [address]    Report Status PENDING  Incomplete  SARS Coronavirus 2     Status: None   Collection Time: 12/11/2018  8:39 PM  Result Value Ref Range Status   SARS Coronavirus 2 NOT DETECTED NOT DETECTED Final    Comment: (NOTE) SARS-CoV-2 target nucleic acids are NOT DETECTED. The SARS-CoV-2 RNA is generally detectable in upper and lower respiratory specimens during the acute phase of infection.  Negative  results do not preclude SARS-CoV-2 infection, do not rule out co-infections with other pathogens, and should not be used as the sole basis for treatment or other patient management decisions.  Negative results must be combined with clinical  observations, patient history, and epidemiological information. The expected result is Not Detected. Fact Sheet for Patients: http://www.biofiredefense.com/wp-content/uploads/2020/03/BIOFIRE-COVID -19-patients.pdf Fact Sheet for Healthcare Providers: http://www.biofiredefense.com/wp-content/uploads/2020/03/BIOFIRE-COVID -19-hcp.pdf This test is not yet approved or cleared by the [place] and  has been authorized for detection and/or diagnosis of SARS-CoV-2 by FDA under an Emergency Use Authorization (EUA).  This EUA will remain in effec t (meaning this test can be used) for the duration of  the COVID-19 declaration under Section 564(b)(1) of the Act, 21 U.S.C. section 360bbb-3(b)(1), unless the authorization is terminated or revoked sooner. Performed at [facility], [address] [place], [place] [address]   Expectorated sputum assessment w rflx to resp cult     Status: None   Collection Time: 12/30/2018  9:02 PM   Specimen: Tracheal Aspirate; Sputum  Result Value Ref Range Status   Specimen Description TRACHEAL ASPIRATE  Final   Special Requests NONE  Final   Sputum evaluation   Final    THIS SPECIMEN IS ACCEPTABLE FOR SPUTUM CULTURE Performed at [facility], [address], [place], [place] [address]    Report Status 12/19/2018 FINAL  Final  Culture, respiratory     Status: None   Collection Time: 12/17/2018  9:02 PM   Specimen: Tracheal Aspirate  Result Value Ref Range Status   Specimen Description TRACHEAL ASPIRATE  Final   Special Requests NONE Reflexed from [id number]  Final   Gram Stain   Final    ABUNDANT WBC PRESENT, PREDOMINANTLY PMN FEW SQUAMOUS EPITHELIAL CELLS  PRESENT RARE GRAM NEGATIVE RODS Performed at [facility], [address] [place], [place] [address]    Culture FEW PSEUDOMONAS AERUGINOSA  Final   Report Status 12/21/2018 FINAL  Final   Organism ID, Bacteria PSEUDOMONAS AERUGINOSA  Final      Susceptibility   Pseudomonas  aeruginosa - MIC*    CEFTAZIDIME 8 SENSITIVE Sensitive     CIPROFLOXACIN >=4 RESISTANT Resistant     GENTAMICIN <=1 SENSITIVE Sensitive     IMIPENEM 1 SENSITIVE Sensitive     PIP/TAZO 64 SENSITIVE Sensitive     CEFEPIME 4 SENSITIVE Sensitive     * FEW PSEUDOMONAS AERUGINOSA  MRSA PCR Screening     Status: Abnormal   Collection Time: 12/19/18  2:28 AM   Specimen: Nasal Mucosa; Nasopharyngeal  Result Value Ref Range Status   MRSA by PCR POSITIVE (A) NEGATIVE Final    Comment:        The GeneXpert MRSA Assay (FDA approved for NASAL specimens only), is one component of a comprehensive MRSA colonization surveillance program. It is not intended to diagnose MRSA infection nor to guide or monitor treatment for MRSA infections. RESULT CALLED TO, READ BACK BY AND VERIFIED WITH: RN M DEBASTIANI @0632  12/19/18 BY S GEZAHEGN Performed at [facility], [address] [place], [place] [address]   Blood Culture (routine x 2)     Status: None (Preliminary result)   Collection Time: 12/19/18  7:42 AM   Specimen: BLOOD  Result Value Ref Range Status   Specimen Description BLOOD LEFT ANTECUBITAL  Final   Special Requests   Final    BOTTLES DRAWN AEROBIC ONLY Blood Culture adequate volume   Culture   Final    NO GROWTH 2 DAYS Performed at [facility], [address] [place], [place] [address]    Report Status PENDING  Incomplete      Radiology Studies: Kore[place] Pelvis (transabdominal Only)  Result Date: 12/21/2018 CLINICAL DATA:  Vaginal bleeding EXAM: TRANSABDOMINAL ULTRASOUND OF PELVIS TECHNIQUE: Transabdominal ultrasound examination of the pelvis was performed including evaluation of the uterus, ovaries, adnexal regions, and pelvic cul-de-sac. COMPARISON:  None FINDINGS: Uterus Measurements: 11.2 x 6.7 x 7.1 cm = volume: 275 mL. Exophytic fundal fibroid noted measuring up to 7.6 cm. Endometrium Thickness: Not well visualized. Right ovary Measurements: 3.4 x 1.4 x 2.3 cm = volume:  7.7 mL. Normal appearance/no adnexal mass. Left ovary Measurements: Not visualized.  No adnexal mass seen. Other findings:  No abnormal free fluid. IMPRESSION: Limited study by body habitus and lack of bladder distention with Foley catheter in place. There appears to be an exophytic 7.6 cm left fundal fibroid. No visible acute process. Electronically Signed   By: Charlett NoseKevin  Dover M.D.   On: 12/21/2018 19:26     Scheduled Meds:  sodium chloride   Intravenous Once   chlorhexidine  5 mL Mouth/Throat BID   Chlorhexidine Gluconate Cloth  6 each Topical Q0600   clonazePAM  0.5 mg Per Tube BID   collagenase   Topical Daily   fentaNYL  1 patch Transdermal Q72H   free water  200 mL Per Tube Q8H   insulin aspart  0-9 Units Subcutaneous Q4H   levothyroxine  50 mcg Per Tube Daily   mouth rinse  15 mL Mouth Rinse q12n4p   mupirocin ointment  1 application Nasal BID   pantoprazole sodium  40 mg Per Tube Daily   polyethylene glycol  17 g Per Tube Daily  risperiDONE  0.5 mg Per Tube QHS   scopolamine  1 patch Transdermal Q72H   sodium chloride flush  10-40 mL Intracatheter Q12H   sodium zirconium cyclosilicate  5 g Oral Once   Continuous Infusions:  sodium chloride 50 mL/hr at 12/22/18 1100   sodium chloride 10 mL/hr at 12/22/18 1900   ceFEPime (MAXIPIME) IV 2 g (12/23/18 0019)   feeding supplement (OSMOLITE 1.5 CAL) 1,000 mL (12/22/18 1738)     LOS: 5 days   Time Spent in minutes   30 minutes  Mykiah Schmuck D.O. on 12/23/2018 at 10:07 AM  Between 7am to 7pm - Please see pager noted on [email or web address]  After 7pm go to [email or web address]  And look for the night coverage person covering for me after hours  [facility] Office  [phone]

## 2018-12-23 NOTE — Progress Notes (Signed)
Tried to do labs this morning from midline but no blood return.  Will have phlebotomy get am labs.

## 2018-12-24 DIAGNOSIS — I5032 Chronic diastolic (congestive) heart failure: Secondary | ICD-10-CM

## 2018-12-24 LAB — CULTURE, BLOOD (ROUTINE X 2)
Culture: NO GROWTH
Special Requests: ADEQUATE

## 2018-12-24 LAB — CBC
HCT: 26.5 % — ABNORMAL LOW (ref 36.0–46.0)
Hemoglobin: 8.2 g/dL — ABNORMAL LOW (ref 12.0–15.0)
MCH: 28.1 pg (ref 26.0–34.0)
MCHC: 30.9 g/dL (ref 30.0–36.0)
MCV: 90.8 fL (ref 80.0–100.0)
Platelets: 226 10*3/uL (ref 150–400)
RBC: 2.92 MIL/uL — ABNORMAL LOW (ref 3.87–5.11)
RDW: 16.7 % — ABNORMAL HIGH (ref 11.5–15.5)
WBC: 12.3 10*3/uL — ABNORMAL HIGH (ref 4.0–10.5)
nRBC: 0 % (ref 0.0–0.2)

## 2018-12-24 LAB — RENAL FUNCTION PANEL
Albumin: 1.3 g/dL — ABNORMAL LOW (ref 3.5–5.0)
Anion gap: 5 (ref 5–15)
BUN: 61 mg/dL — ABNORMAL HIGH (ref 8–23)
CO2: 25 mmol/L (ref 22–32)
Calcium: 8.9 mg/dL (ref 8.9–10.3)
Chloride: 108 mmol/L (ref 98–111)
Creatinine, Ser: 1.62 mg/dL — ABNORMAL HIGH (ref 0.44–1.00)
GFR calc Af Amer: 37 mL/min — ABNORMAL LOW (ref >60)
GFR calc non Af Amer: 32 mL/min — ABNORMAL LOW (ref >60)
Glucose, Bld: 162 mg/dL — ABNORMAL HIGH (ref 70–99)
Phosphorus: 2.9 mg/dL (ref 2.5–4.6)
Potassium: 5.2 mmol/L — ABNORMAL HIGH (ref 3.5–5.1)
Sodium: 138 mmol/L (ref 135–145)

## 2018-12-24 LAB — GLUCOSE, CAPILLARY
Glucose-Capillary: 162 mg/dL — ABNORMAL HIGH (ref 70–99)
Glucose-Capillary: 157 mg/dL — ABNORMAL HIGH (ref 70–99)
Glucose-Capillary: 155 mg/dL — ABNORMAL HIGH (ref 70–99)
Glucose-Capillary: 130 mg/dL — ABNORMAL HIGH (ref 70–99)

## 2018-12-24 MED ORDER — GLYCOPYRROLATE 0.2 MG/ML IJ SOLN: 0.2000 mg | INTRAMUSCULAR | Status: DC | PRN

## 2018-12-24 MED ORDER — POLYVINYL ALCOHOL 1.4 % OP SOLN
1.0000 [drp] | Freq: Four times a day (QID) | OPHTHALMIC | Status: DC | PRN
  Administered 2018-12-25: 1 [drp] via OPHTHALMIC
  Filled 2018-12-24: qty 15

## 2018-12-24 MED ORDER — GLYCOPYRROLATE 1 MG PO TABS: 1.0000 mg | ORAL_TABLET | ORAL | Status: DC | PRN

## 2018-12-24 MED ORDER — HALOPERIDOL LACTATE 2 MG/ML PO CONC
0.5000 mg | ORAL | Status: DC | PRN
  Filled 2018-12-24 (×2): qty 0.3

## 2018-12-24 MED ORDER — LORAZEPAM 2 MG/ML IJ SOLN
1.0000 mg | Freq: Four times a day (QID) | INTRAMUSCULAR | Status: DC
  Administered 2018-12-24 – 2018-12-26 (×8): 1 mg via INTRAVENOUS
  Filled 2018-12-24 (×8): qty 1

## 2018-12-24 MED ORDER — SODIUM CHLORIDE 0.9 % IV SOLN
1.0000 mg/h | INTRAVENOUS | Status: DC
  Administered 2018-12-24: 0.5 mg/h via INTRAVENOUS
  Filled 2018-12-24: qty 5

## 2018-12-24 MED ORDER — HALOPERIDOL LACTATE 5 MG/ML IJ SOLN: 0.5000 mg | INTRAMUSCULAR | Status: DC | PRN

## 2018-12-24 MED ORDER — HYDROMORPHONE BOLUS VIA INFUSION
1.0000 mg | INTRAVENOUS | Status: DC | PRN
  Filled 2018-12-24: qty 1

## 2018-12-24 MED ORDER — BIOTENE DRY MOUTH MT LIQD: 15.0000 mL | OROMUCOSAL | Status: DC | PRN

## 2018-12-24 MED ORDER — HALOPERIDOL 0.5 MG PO TABS: 0.5000 mg | ORAL_TABLET | ORAL | Status: DC | PRN

## 2018-12-24 NOTE — Progress Notes (Signed)
Chaplain responded to a Consult for patient being put on comfort measures and family requested chaplain for prayer.  Upon arrival the family had gone and not expected back tonight.  Nurse was in the room.  Chaplain went to bedside and offered prayer for the patient.  Chaplain is available if family returns.   [place], MDiv.       12/24/18 1700  Clinical Encounter Type  Visited With Patient;Health care provider  Visit Type Critical Care  Referral From Nurse;Family  Consult/Referral To Chaplain  Spiritual Encounters  Spiritual Needs Prayer  Stress Factors  Family Stress Factors Major life changes

## 2018-12-24 NOTE — Progress Notes (Signed)
Daily Progress Note   Patient Name: Shannon Lang       Date: 12/24/2018 DOB: [birth year]  Age: 73 y.o. MRN#: [id number] Attending Physician: Donne Hazel, MD Primary Care Physician: System, Pcp Not In Admit Date: 12/19/2018  Reason for Consultation/Follow-up: Establishing goals of care, Psychosocial/spiritual support and Withdrawal of life-sustaining treatment  Subjective: Met with daughter [name]) and fiance Herbie Baltimore) at bedside.  [name] was sobbing asking her mother if she wanted to go to home (to her heavenly home).  I asked [name] and Herbie Baltimore to talk with me privately in the conference room.  [name] is the only child and sole medical decision maker for her mother.  [name] immediately stated "I can't leave my momma like this, no one would want to live like this".  She broke down into sobs several times second guessing the really difficult decisions the medical system has asked her to make over the past 3 months (intubation? Trach ? Feeding tube? LTAC?) [name] had to make each of those decisions without seeing her mother.  [name] remained steadfast in her decision that her mother would not want to live like this and it is time to let her go.  We talked about hospice house near the family in [place], but an ambulance ride that far would be very hard on Ms. Guinta.  I asked the family to let me get the artificial support off of her and then let's see how she stabilizes -then we can consider a local hospice.  I gave [name] space and time to express grief for the death of her father and the difficulties with her ex husband and her 5 daughters.  I drew her back to the conversation regarding her mother.  She remained firm in her decision to allow her to go to heaven now.  I assured [name] we would take  good care of her mother and keep her comfortable and cared for.  Assessment: 73 yo female with dementia and chronic resp failure with trach and PEG after a cardiac arrest.  She developed hypotension and acute on chronic renal failure.  She has had difficulty with malnutrition and anemia. Now comfort measures only.  Patient Profile/HPI:  73 y.o. female  with past medical history of cardiac arrest in April, 2020, leading to respiratory failure, intubation, (11/06/2018), PEG (11/11/2018), congestive  heart failure, diabetes type 2, sacral decubitus, atrial fib, dementia, COPD, admitted on 01/01/2019 with hypotension and worsening kidney function.  Patient has been living at [facility] facility since cardiac arrest in April 2020.  She was admitted on 12/09/2018 after being found to be hypotensive, labs reflecting worsening renal function, pneumonia.  Chest x-ray shows right lower lobe infiltrate  Length of Stay: 6  Current Medications: Scheduled Meds:  . chlorhexidine  5 mL Mouth/Throat BID  . Chlorhexidine Gluconate Cloth  6 each Topical Q0600  . collagenase   Topical Daily  . fentaNYL  1 patch Transdermal Q72H  . LORazepam  1 mg Intravenous Q6H  . mouth rinse  15 mL Mouth Rinse q12n4p  . mupirocin ointment  1 application Nasal BID  . pantoprazole sodium  40 mg Per Tube Daily  . risperiDONE  0.5 mg Per Tube QHS  . scopolamine  1 patch Transdermal Q72H    Continuous Infusions: . sodium chloride 50 mL/hr at 12/24/18 0531  . HYDROmorphone      PRN Meds: albuterol, antiseptic oral rinse, glycopyrrolate **OR** glycopyrrolate **OR** glycopyrrolate, haloperidol **OR** haloperidol **OR** haloperidol lactate, HYDROmorphone, ondansetron **OR** ondansetron (ZOFRAN) IV, polyvinyl alcohol  Physical Exam       Well developed chronically ill appearing female, awake, appears slightly anxious CV rrr resp trach in place, coughing Abdomen obese   Vital Signs: BP (!) 121/46 (BP Location: Right Wrist)    Pulse 70   Temp 98.5 F (36.9 C) (Axillary)   Resp 16   Ht _0  (1.651 m)   Wt 107.4 kg   LMP  (LMP Unknown)   SpO2 96%   BMI 39.40 kg/m  SpO2: SpO2: 96 % O2 Device: O2 Device: Tracheostomy Collar O2 Flow Rate: O2 Flow Rate (L/min): 5 L/min  Intake/output summary:   Intake/Output Summary (Last 24 hours) at 12/24/2018 1657 Last data filed at 12/24/2018 1632 Gross per 24 hour  Intake 949 ml  Output 1850 ml  Net -901 ml   LBM: Last BM Date: 12/23/18 Baseline Weight: Weight: 113.9 kg Most recent weight: Weight: 107.4 kg       Palliative Assessment/Data: 10%   Patient Active Problem List   Diagnosis Date Noted  . Tracheostomy status ([place])   . Respiratory failure ([place])   . Goals of care, counseling/discussion   . Palliative care by specialist   . AKI (acute kidney injury) ([place]) 12/17/2018  . Dementia ([place]) 12/29/2018  . DM2 (diabetes mellitus, type 2) ([place]) 12/30/2018  . Fever 01/05/2019  . Hyperkalemia 12/16/2018  . Sacral decubitus ulcer 12/31/2018  . Acute on chronic respiratory failure with hypoxia ([place])   . Aspiration pneumonia due to gastric secretions ([place])   . A-fib ([place])   . Chronic congestive heart failure with left ventricular diastolic dysfunction ([place])   . COPD, severe [facility])     Palliative Care Plan    Recommendations/Plan:  Change to DNR  Comfort measures only.  Will DC all interventions not related to comfort.  Will stabilize Ms. Rocca off of oxygen and medical interventions and then consider Hospice House if she lingers.  Goals of Care and Additional Recommendations:  Limitations on Scope of Treatment: Full Comfort Care  Code Status:  DNR  Prognosis:   Hours - Days   Discharge Planning:  Anticipated Hospital Death vs [place] was discussed with dtr Daphine Deutscher, Bedside RN, and TRH MD  Thank you for allowing the Palliative Medicine Team to assist in the  care of this patient.  Total time spent:  60 min.      Greater than 50%  of this time was spent counseling and coordinating care related to the above assessment and plan.  Florentina Jenny, PA-C Palliative Medicine  Please contact Palliative MedicineTeam phone at [phone] for questions and concerns between 7 am - 7 pm.   Please see AMION for individual provider pager numbers.

## 2018-12-24 NOTE — Progress Notes (Signed)
PROGRESS NOTE    Shannon Lang  URK:[id number] DOB: 1945-11-26 DOA: 12/17/2018 PCP: System, Pcp Not In    Brief Narrative:  73 y.o.femalewith medical history significant ofdementia, COPD, Respiratory failure requiring intubation on 4/16, also apparently cardiac arrest requiring CPR. Patient s/p tracheostomy (4/30) and PEG placement (5/15). Patient resides at [facility] since that time.  Patient has been having fever, [facility]. Was apparently started on cefepime and vanc there recently.  Today apparently also had AKF and hypotension which prompted them to send her in to the ED.  Assessment & Plan:   Principal Problem:   AKI (acute kidney injury) ([place]) Active Problems:   Acute on chronic respiratory failure with hypoxia (HCC)   A-fib (HCC)   Chronic congestive heart failure with left ventricular diastolic dysfunction (HCC)   COPD, severe (HCC)   Dementia (HCC)   DM2 (diabetes mellitus, type 2) (HCC)   Fever   Hyperkalemia   Sacral decubitus ulcer   Respiratory failure (HCC)   Goals of care, counseling/discussion   Palliative care by specialist   Tracheostomy status ([place])  Acute kidney injury with hyperkalemia -Creatinine on admission 2.28, baseline unknown. Currently 1.68 -Nephrology consulted and appreciated, feels patient is not a candidate for long-term dialysis.  Also feels that her creatinine and GFR may be a lot worse than the calculated GFR due to poor muscle mass.  Patient's azotemia is out of proportion to her increase in creatinine. -Renal ultrasound: Difficult visualization of kidneys due to habitus.  Negative for hydronephrosis.  Mild renal cortical thinning. -Continue IV fluids as tolerated -patient was given lokelma for hyperkalemia along with albuterol, insulin and glucose, calcium gluconate, IV bicarb -Potassium 5.2, down from 5.4, status post lokelma -Repeat basic metabolic panel in the morning.  Creatinine improved  Acute on  chronic respiratory failure with hypoxia with trach -patient with tracheostomy  -PCCM consulted and appreciated -Remains on 5 L trach collar  RLL, suspect HCAP -Supposedly per PCCM note, patient had fever of 100, however patient was mildly hypothermic with of 96.53F -CXR shows stable right basilar atelectasis vs infiltrate -no leukocytosis, fever, or tachycarida noted -lactic acid normal -Patient was initially continued on vancomycin and cefepime -PCCM added urine strep pneumoniae and legionella antigens -urine Strep pneumo Ag negative -Respiratory culture: Pseudomonas aeruginosa - given culture results, vancomycin discontinued  -COVID negative -Continue to wean O2 as tolerated  Hypernatremia/hyperchloremia -Only on half-normal saline -Sodium improving, down to 138, Chloride down to 108 -PCCM ordered free water  -Recheck basic metabolic panel in the morning  Acute normocytic anemia/ Symptomatic anemia/ ?GU bleeding  -hemoglobin noted to have a low of 7.5 (suspect baseline 9-10)  -possibly dilutional as patient has been on IVF -FOBT negative -Anemia panel shows adequate iron and storage -appears bleeding is vaginal/GU in origin -Transvaginal US: limited due to body habits. Exophytic 7.6cm left fundal fibroid. No visible acute process -Dr. Ree Kida had discussed Dr. Harolyn Rutherford (Ob/gyn), recommended megace 80mg  BID until bleeding subsides. Patient is not a candidate for hormonal type of treatment or invasive procedures. Ultimately she would need an endometrial biopsy to further investigate cause of bleeding.  -discontinued lovenox -Patient was given 1u PRBC on 12/22/2018- hemoglobin up to 8.8, now 8.2 -Given her immobility, concern that Megace may place her at increased risk of thromboembolism  -Repeat CBC in the morning  PAF -metoprolol currently held -currently in sinus rhythm  NSVT -Patient had an 11beat run of VT overnight -Potassium >5, magnesium 2.1 -will continue to  monitor   Chronic diastolic congestive heart failure -per medical history -Appears euvolemic and compensated -monitor intake/output, daily weights -Echocardiogram EF 55-60%, LV diastolic parameters are indeterminate -Appears stable at present  Diabetes mellitus, type 2 -Continue ISS and CBG monitoring -restarted lantus 6u BID, added novolog 3u q4h for better control -Glucose trends reviewed, stable at this time  Dementia  -Continue klonopin, risperdal as tolerated  Sacral decubitus ulcer -stage 3-4 -present on admission -Wound care consulted  Goals of care -patient with recent history of arrest, tracheostomy  -Presently full code -Palliative care consulted and appreciated -Dr. Catha GosselinMikhail had an extensive conversation with daughter via phone and felt it would be beneficial for her to come and visit her mother -[facility] care [facility] when hemoglobin stabilizes   DVT prophylaxis: SCDs Code Status: Full code Family Communication: Patient in room, family not at bedside Disposition Plan: Uncertain at this time, [facility]  Consultants:   Critical care  Palliative care  Procedures:  US Renal Transvaginal US  Antimicrobials: Anti-infectives (From admission, onward)   Start     Dose/Rate Route Frequency Ordered Stop   12/22/18 1200  ceFEPIme (MAXIPIME) 2 g in sodium chloride 0.9 % 100 mL IVPB     2 g 200 mL/hr over 30 Minutes Intravenous Every 12 hours 12/22/18 1112     12/20/18 0830  vancomycin (VANCOCIN) 1,500 mg in sodium chloride 0.9 % 500 mL IVPB  Status:  Discontinued     1,500 mg 250 mL/hr over 120 Minutes Intravenous Every 48 hours 12/20/18 0827 12/22/18 0841   12/19/18 1900  ceFEPIme (MAXIPIME) 2 g in sodium chloride 0.9 % 100 mL IVPB  Status:  Discontinued     2 g 200 mL/hr over 30 Minutes Intravenous Every 24 hours 2019/06/26 2217 12/22/18 1112   12/19/18 0800  ceFEPIme  (MAXIPIME) 2 g in sodium chloride 0.9 % 100 mL IVPB  Status:  Discontinued     2 g 200 mL/hr over 30 Minutes Intravenous Every 12 hours 2019/06/26 2026 2019/06/26 2217   2019/06/26 2211  vancomycin variable dose per unstable renal function (pharmacist dosing)  Status:  Discontinued      Does not apply See admin instructions 2019/06/26 2211 12/20/18 0827   2019/06/26 1845  ceFEPIme (MAXIPIME) 2 g in sodium chloride 0.9 % 100 mL IVPB     2 g 200 mL/hr over 30 Minutes Intravenous  Once 2019/06/26 1838 2019/06/26 2034       Subjective: Unable to assess, patient is nonverbal  Objective: Vitals:   12/24/18 0804 12/24/18 1129 12/24/18 1145 12/24/18 1503  BP: (!) 132/54  (!) 123/53   Pulse: 74 67 70 76  Resp: 16 12 13  (!) 22  Temp:   99 F (37.2 C)   TempSrc:   Axillary   SpO2: 100% 98% 98% 97%  Weight:      Height:        Intake/Output Summary (Last 24 hours) at 12/24/2018 1506 Last data filed at 12/24/2018 1152 Gross per 24 hour  Intake 949 ml  Output 2450 ml  Net -1501 ml   Filed Weights   12/20/18 0348 12/22/18 0300 12/24/18 0411  Weight: 100.2 kg 104.8 kg 107.4 kg    Examination:  General exam: Appears calm and comfortable  Respiratory system: Clear to auscultation. Respiratory effort normal.  No audible wheezing Cardiovascular system: S1 & S2 heard, RRR Gastrointestinal system: Abdomen is nondistended, soft and nontender.  No organomegaly or masses felt. Normal bowel sounds heard. Central nervous system: Alert and oriented. No focal neurological deficits. Extremities: Symmetric 5 x 5 power. Skin: No rashes, lesions Psychiatry: Unable to assess, patient is trached and pegged, unable to vocalize  Data Reviewed: I have personally reviewed following labs and imaging studies  CBC: Recent Labs  Lab 2018/07/24 1834  12/20/18 0637 12/21/18 0624 12/22/18 0637 12/23/18 0622 12/24/18 0634  WBC 10.6*   < > 11.0* 9.4 9.7 11.8* 12.3*  NEUTROABS 8.3*  --   --   --  7.4  --   --   HGB  10.2*   < > 8.6* 7.6* 7.5* 8.8* 8.2*  HCT 35.4*   < > 29.4* 25.4* 24.8* 28.6* 26.5*  MCV 95.4   < > 92.2 91.4 90.5 90.2 90.8  PLT 217   < > 173 166 175 198 226   < > = values in this interval not displayed.   Basic Metabolic Panel: Recent Labs  Lab 12/19/18 0742 12/19/18 1857 12/20/18 [id number] 12/20/18 1709 12/21/18 0624 12/22/18 0637 12/23/18 0622 12/24/18 0634  NA 148*  --  139  --  137 138 138 138  K 5.7*  --  4.8  --  4.5 5.0 5.4* 5.2*  CL 116*  --  109  --  106 107 108 108  CO2 28  --  23  --  24 23 24 25   GLUCOSE 153*  --  286*  --  294* 240* 289* 162*  BUN 145*  --  119*  --  91* 81* 69* 61*  CREATININE 2.19*  --  1.92*  --  1.65* 1.68* 1.68* 1.62*  CALCIUM 10.0  --  8.9  --  8.4* 8.5* 8.7* 8.9  MG 3.1* 2.7* 2.5* 2.3  --  2.1  --   --   PHOS 3.1  --  2.6  --  2.9 2.9 2.8 2.9   GFR: Estimated Creatinine Clearance: 39.4 mL/min (A) (by C-G formula based on SCr of 1.62 mg/dL (H)). Liver Function Tests: Recent Labs  Lab 2018/07/24 1834  12/20/18 [id number] 12/21/18 0624 12/22/18 0637 12/23/18 0622 12/24/18 0634  AST 32  --   --   --   --   --   --   ALT 43  --   --   --   --   --   --   ALKPHOS 131*  --   --   --   --   --   --   BILITOT 0.3  --   --   --   --   --   --   PROT 7.0  --   --   --   --   --   --   ALBUMIN 1.7*   < > 1.5* 1.3* 1.3* 1.4* 1.3*   < > = values in this interval not displayed.   No results for input(s): LIPASE, AMYLASE in the last 168 hours. No results for input(s): AMMONIA in the last 168 hours. Coagulation Profile: Recent Labs  Lab 2018/07/24 1834  INR 1.1   Cardiac Enzymes: No results for input(s): CKTOTAL, CKMB, CKMBINDEX, TROPONINI in the last 168 hours. BNP (last 3 results) No results for input(s): PROBNP in the last 8760 hours. HbA1C: No results for input(s): HGBA1C in the last 72 hours. CBG: Recent Labs  Lab 12/23/18 1927 12/23/18 2325 12/24/18 0357 12/24/18 0724 12/24/18 1147  GLUCAP 245* 127* 162* 157* 155*   Lipid Profile:  No results for input(s): CHOL, HDL, LDLCALC, TRIG, CHOLHDL, LDLDIRECT in the last 72 hours. Thyroid Function Tests: No results for input(s): TSH, T4TOTAL, FREET4, T3FREE, THYROIDAB in the last 72 hours. Anemia Panel: No results for input(s): VITAMINB12, FOLATE, FERRITIN, TIBC, IRON, RETICCTPCT in the last 72 hours. Sepsis Labs: Recent Labs  Lab 12/14/2018 2125 12/19/18 0742  LATICACIDVEN 1.4 1.1    Recent Results (from the past 240 hour(s))  Blood Culture (routine x 2)     Status: None   Collection Time: 12/13/2018  5:41 PM   Specimen: BLOOD LEFT HAND  Result Value Ref Range Status   Specimen Description BLOOD LEFT HAND  Final   Special Requests   Final    BOTTLES DRAWN AEROBIC AND ANAEROBIC Blood Culture adequate volume   Culture   Final    NO GROWTH 5 DAYS Performed at [facility], [address] [place], [place] [address]    Report Status 12/23/2018 FINAL  Final  SARS Coronavirus 2     Status: None   Collection Time: 01/04/2019  8:39 PM  Result Value Ref Range Status   SARS Coronavirus 2 NOT DETECTED NOT DETECTED Final    Comment: (NOTE) SARS-CoV-2 target nucleic acids are NOT DETECTED. The SARS-CoV-2 RNA is generally detectable in upper and lower respiratory specimens during the acute phase of infection.  Negative  results do not preclude SARS-CoV-2 infection, do not rule out co-infections with other pathogens, and should not be used as the sole basis for treatment or other patient management decisions.  Negative results must be combined with clinical observations, patient history, and epidemiological information. The expected result is Not Detected. Fact Sheet for Patients: http://www.biofiredefense.com/wp-content/uploads/2020/03/BIOFIRE-COVID -19-patients.pdf Fact Sheet for Healthcare Providers: http://www.biofiredefense.com/wp-content/uploads/2020/03/BIOFIRE-COVID -19-hcp.pdf This test is not yet approved or cleared by the [place] and  has been  authorized for detection and/or diagnosis of SARS-CoV-2 by FDA under an Emergency Use Authorization (EUA).  This EUA will remain in effec t (meaning this test can be used) for the duration of  the COVID-19 declaration under Section 564(b)(1) of the Act, 21 U.S.C. section 360bbb-3(b)(1), unless the authorization is terminated or revoked sooner. Performed at [facility], [address], [place], [place] [address]   Expectorated sputum assessment w rflx to resp cult     Status: None   Collection Time: 12/27/2018  9:02 PM   Specimen: Tracheal Aspirate; Sputum  Result Value Ref Range Status   Specimen Description TRACHEAL ASPIRATE  Final   Special Requests NONE  Final   Sputum evaluation   Final    THIS SPECIMEN IS ACCEPTABLE FOR SPUTUM CULTURE Performed at [facility], [address] [place], [place] [address]    Report Status 12/19/2018 FINAL  Final  Culture, respiratory     Status: None   Collection Time: 12/23/2018  9:02 PM   Specimen: Tracheal Aspirate  Result Value Ref Range Status   Specimen Description TRACHEAL ASPIRATE  Final   Special Requests NONE Reflexed from [id number]  Final   Gram Stain   Final    ABUNDANT WBC PRESENT, PREDOMINANTLY PMN FEW SQUAMOUS EPITHELIAL CELLS PRESENT RARE GRAM NEGATIVE RODS Performed at [facility], [address] [place], [place] [address]    Culture FEW PSEUDOMONAS AERUGINOSA  Final   Report Status 12/21/2018 FINAL  Final   Organism ID, Bacteria PSEUDOMONAS AERUGINOSA  Final      Susceptibility   Pseudomonas aeruginosa - MIC*    CEFTAZIDIME 8  SENSITIVE Sensitive     CIPROFLOXACIN >=4 RESISTANT Resistant     GENTAMICIN <=1 SENSITIVE Sensitive     IMIPENEM 1 SENSITIVE Sensitive     PIP/TAZO 64 SENSITIVE Sensitive     CEFEPIME 4 SENSITIVE Sensitive     * FEW PSEUDOMONAS AERUGINOSA  MRSA PCR Screening     Status: Abnormal   Collection Time: 12/19/18  2:28 AM   Specimen: Nasal Mucosa; Nasopharyngeal  Result Value Ref Range  Status   MRSA by PCR POSITIVE (A) NEGATIVE Final    Comment:        The GeneXpert MRSA Assay (FDA approved for NASAL specimens only), is one component of a comprehensive MRSA colonization surveillance program. It is not intended to diagnose MRSA infection nor to guide or monitor treatment for MRSA infections. RESULT CALLED TO, READ BACK BY AND VERIFIED WITH: RN M DEBASTIANI @0632  12/19/18 BY S GEZAHEGN Performed at [facility], [address] [place], [place] [address]   Blood Culture (routine x 2)     Status: None   Collection Time: 12/19/18  7:42 AM   Specimen: BLOOD  Result Value Ref Range Status   Specimen Description BLOOD LEFT ANTECUBITAL  Final   Special Requests   Final    BOTTLES DRAWN AEROBIC ONLY Blood Culture adequate volume   Culture   Final    NO GROWTH 5 DAYS Performed at [facility], [address] [place], [place] [address]    Report Status 12/24/2018 FINAL  Final     Radiology Studies: No results found.  Scheduled Meds: . sodium chloride   Intravenous Once  . chlorhexidine  5 mL Mouth/Throat BID  . Chlorhexidine Gluconate Cloth  6 each Topical Q0600  . clonazePAM  0.5 mg Per Tube BID  . collagenase   Topical Daily  . fentaNYL  1 patch Transdermal Q72H  . free water  200 mL Per Tube Q8H  . insulin aspart  0-9 Units Subcutaneous Q4H  . insulin aspart  4 Units Subcutaneous Q4H  . insulin glargine  6 Units Subcutaneous BID  . levothyroxine  50 mcg Per Tube Daily  . mouth rinse  15 mL Mouth Rinse q12n4p  . mupirocin ointment  1 application Nasal BID  . pantoprazole sodium  40 mg Per Tube Daily  . polyethylene glycol  17 g Per Tube Daily  . risperiDONE  0.5 mg Per Tube QHS  . scopolamine  1 patch Transdermal Q72H  . sodium chloride flush  10-40 mL Intracatheter Q12H   Continuous Infusions: . sodium chloride 50 mL/hr at 12/24/18 0531  . sodium chloride 10 mL/hr at 12/22/18 1900  . ceFEPime (MAXIPIME) IV 2 g (12/24/18 1347)  . feeding  supplement (OSMOLITE 1.5 CAL) 1,000 mL (12/24/18 1340)     LOS: 6 days   Rickey BarbaraStephen Riyaan Heroux, MD [facility] Pager On Amion  If 7PM-7AM, please contact night-coverage 12/24/2018, 3:06 PM

## 2018-12-25 DIAGNOSIS — Z515 Encounter for palliative care: Secondary | ICD-10-CM

## 2018-12-25 NOTE — Progress Notes (Signed)
Shannon Lang's daughter shifted her mother's care to full comfort yesterday and orders were changed.  Tube feeds and antibiotics were turned off.  Shannon Lang is sleeping very comfortably this am.  She opens her eyes to touch but immediately closes them again.  She has no distress.    She is still on 5L of oxygen.  Now that she is comfortable on a dilaudid gtt.  It is appropriate to wean oxygen to room air.    Discussed with RN.  Updated Shannon Lang to let her know that her mother is sleeping comfortably.  Recommendation:  Wean to room air.    Disposition:  Anticipated hospital death vs Hospice House.  Shannon Lang may be a good candidate for hospice house.  Florentina Jenny, PA-C Palliative Medicine Pager: [phone]  Time 15 min.  Time In: 7:45 Time Out: 8:00 Total Time: 15 min.  Greater than 50%  of this time was spent counseling and coordinating care related to the above assessment and plan

## 2018-12-25 NOTE — Progress Notes (Signed)
Nutrition Brief Note  Chart reviewed. Pt now transitioning to comfort care.  No further nutrition interventions warranted at this time.   Limmie Schoenberg, MS, RD, LDN [facility] Inpatient Clinical Dietitian Pager: [phone] After Hours Pager: [phone]   

## 2018-12-25 NOTE — Care Management Important Message (Signed)
Important Message  Patient Details  Name: Shannon Lang MRN: [id number] Date of Birth: 09-14-1945   Medicare Important Message Given:  Yes    Orbie Pyo 12/25/2018, 11:48 AM

## 2018-12-25 NOTE — TOC Initial Note (Signed)
Transition of Care [facility]) - Initial/Assessment Note    Patient Details  Name: Shannon Lang MRN: [id number] Date of Birth: 07/22/1945  Transition of Care [facility]) CM/SW Contact:    Zenon Mayo, RN Phone Number: 12/25/2018, 2:04 PM  Clinical Narrative:                 From [facility],  chronic trach/pet, vaginal bleeding, palliative meeting with family.  Palliative met with family and they have decided on comfort care.        Expected Discharge Plan: [place] Barriers to Discharge: No Barriers Identified   Patient Goals and CMS Choice Patient states their goals for this hospitalization and ongoing recovery are:: to be comfortable   Choice offered to / list presented to : NA  Expected Discharge Plan and Services Expected Discharge Plan: [place] In-house Referral: Clinical Social Work Discharge Planning Services: CM Consult Post Acute Care Choice: NA Living arrangements for the past 2 months: [place]                 DME Arranged: (NA)         HH Arranged: NA          Prior Living Arrangements/Services Living arrangements for the past 2 months: [place] Lives with:: Facility Resident   Do you feel safe going back to the place where you live?: (going to residential hospice)      Need for Family Participation in Patient Care: Yes (Comment) Care giver support system in place?: Yes (comment)(for residential hospice)   Criminal Activity/Legal Involvement Pertinent to Current Situation/Hospitalization: No - Comment as needed  Activities of Daily Living Home Assistive Devices/Equipment: Vent/Trach supplies, Enteral Feeding Supplies ADL Screening (condition at time of admission) Patient's cognitive ability adequate to safely complete daily activities?: No Is the patient deaf or have difficulty hearing?: No Does the patient have difficulty seeing, even when wearing glasses/contacts?: No Does the  patient have difficulty concentrating, remembering, or making decisions?: No Patient able to express need for assistance with ADLs?: Yes Does the patient have difficulty dressing or bathing?: Yes Independently performs ADLs?: No Does the patient have difficulty walking or climbing stairs?: Yes Weakness of Legs: Both Weakness of Arms/Hands: Both  Permission Sought/Granted                  Emotional Assessment   Attitude/Demeanor/Rapport: (resting) Affect (typically observed): (resting) Orientation: : (comfort care) Alcohol / Substance Use: Not Applicable Psych Involvement: No (comment)  Admission diagnosis:  AKI (acute kidney injury) ([place]) [N17.9] Patient Active Problem List   Diagnosis Date Noted  . Terminal care   . Comfort measures only status   . Tracheostomy status ([place])   . Respiratory failure ([place])   . Goals of care, counseling/discussion   . Palliative care by specialist   . AKI (acute kidney injury) ([place]) 12/31/2018  . Dementia ([place]) 01/01/2019  . DM2 (diabetes mellitus, type 2) ([place]) 12/28/2018  . Fever 12/21/2018  . Hyperkalemia 12/29/2018  . Sacral decubitus ulcer 12/15/2018  . Acute on chronic respiratory failure with hypoxia ([place])   . Aspiration pneumonia due to gastric secretions ([place])   . A-fib ([place])   . 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 COPD, severe ([place])    PCP:  System, Pcp Not In Pharmacy:  No Pharmacies Listed    Social Determinants of Health (SDOH) Interventions    Readmission Risk Interventions No flowsheet data  found.  

## 2018-12-25 NOTE — Progress Notes (Addendum)
PROGRESS NOTE    Shannon Lang  XBJ:[id number] DOB: 1945-12-31 DOA: 01/05/2019 PCP: System, Pcp Not In    Brief Narrative:  73 y.o.femalewith medical history significant ofdementia, COPD, Respiratory failure requiring intubation on 4/16, also apparently cardiac arrest requiring CPR. Patient s/p tracheostomy (4/30) and PEG placement (5/15). Patient resides at [facility] since that time.  Patient has been having fever, increasing O2 [facility] at [facility]. Was apparently started on cefepime and vanc there recently.  Today apparently also had AKF and hypotension which prompted them to send her in to the ED.  Assessment & Plan:   Principal Problem:   AKI (acute kidney injury) (HCC) Active Problems:   Acute on chronic respiratory failure with hypoxia (HCC)   A-fib (HCC)   Chronic congestive heart failure with left ventricular diastolic dysfunction (HCC)   COPD, severe (HCC)   Dementia (HCC)   DM2 (diabetes mellitus, type 2) (HCC)   Fever   Hyperkalemia   Sacral decubitus ulcer   Respiratory failure (HCC)   Goals of care, counseling/discussion   Palliative care by specialist   Tracheostomy status (HCC)   Terminal care   Comfort measures only status  Acute kidney injury with hyperkalemia -Creatinine on admission 2.28, baseline unknown. Most recent Cr of 1.62 -Nephrology consulted and appreciated, feels patient is not a candidate for long-term dialysis.  Also feels that her creatinine and GFR may be a lot worse than the calculated GFR due to poor muscle mass.  Patient's azotemia is out of proportion to her increase in creatinine. -Renal ultrasound: Difficult visualization of kidneys due to habitus.  Negative for hydronephrosis.  Mild renal cortical thinning. -Appreciate input by Palliative Care. Plan for transition to full comfort care  Acute on chronic respiratory failure with hypoxia with trach -patient with tracheostomy  -PCCM consulted and appreciated -Cont O2 as  needed. Now on full comfort  RLL, suspect HCAP -Supposedly per PCCM note, patient had fever of 100, however patient was mildly hypothermic with of 96.27F -CXR shows stable right basilar atelectasis vs infiltrate -no leukocytosis, fever, or tachycarida noted -lactic acid normal -Patient was initially continued on vancomycin and cefepime -PCCM added urine strep pneumoniae and legionella antigens -urine Strep pneumo Ag negative -Respiratory culture: Pseudomonas aeruginosa - given culture results, vancomycin discontinued  -COVID negative -Now transitioned to full comfort  Hypernatremia/hyperchloremia -Only on half-normal saline -Sodium improved. -Now full comfort  Acute normocytic anemia/ Symptomatic anemia/ ?GU bleeding  -hemoglobin noted to have a low of 7.5 (suspect baseline 9-10)  -possibly dilutional as patient has been on IVF -FOBT negative -Anemia panel shows adequate iron and storage -appears bleeding is vaginal/GU in origin -Transvaginal US: limited due to body habits. Exophytic 7.6cm left fundal fibroid. No visible acute process -Dr. Catha Gosselin had discussed Dr. Macon Large (Ob/gyn), recommended megace  BID until bleeding subsides. Patient is not a candidate for hormonal type of treatment or invasive procedures. Ultimately she would need an endometrial biopsy to further investigate cause of bleeding.  -discontinued lovenox -Now on full comfort care  PAF -metoprolol currently held -currently in sinus rhythm -cont per comfort measures  NSVT -cont with comfort measures only  Chronic diastolic congestive heart failure -per medical history -Appears euvolemic and compensated -monitor intake/output, daily weights -Echocardiogram EF 55-60%, LV diastolic parameters are indeterminate -Now comfort care  Diabetes mellitus, type 2 -Continue ISS and CBG monitoring -restarted lantus 6u BID, added novolog 3u q4h for better control -Now cont per comfort measures  Dementia   -Cont with comfort measures  Sacral  decubitus ulcer -stage 3-4 -present on admission -Comfort care  Goals of care -patient with recent history of arrest, tracheostomy  -Presently full code -Palliative care consulted and appreciated -Family has decided on transitioning to full comfort. Anticipate possible residential hospice. Have discussed with SW   DVT prophylaxis: Comfort measures Code Status: DNR/comfort Family Communication: Patient in room, family not at bedside Disposition Plan: Hospital death vs residential hospice  Consultants:   Critical care  Palliative care  Procedures:  US Renal Transvaginal US  Antimicrobials: Anti-infectives (From admission, onward)   Start     Dose/Rate Route Frequency Ordered Stop   12/22/18 1200  ceFEPIme (MAXIPIME) 2 g in sodium chloride 0.9 % 100 mL IVPB  Status:  Discontinued     2 g 200 mL/hr over 30 Minutes Intravenous Every 12 hours 12/22/18 1112 12/24/18 1652   12/20/18 0830  vancomycin (VANCOCIN) 1,500 mg in sodium chloride 0.9 % 500 mL IVPB  Status:  Discontinued     1,500 mg 250 mL/hr over 120 Minutes Intravenous Every 48 hours 12/20/18 0827 12/22/18 0841   12/19/18 1900  ceFEPIme (MAXIPIME) 2 g in sodium chloride 0.9 % 100 mL IVPB  Status:  Discontinued     2 g 200 mL/hr over 30 Minutes Intravenous Every 24 hours 13-Jun-2019 2217 12/22/18 1112   12/19/18 0800  ceFEPIme (MAXIPIME) 2 g in sodium chloride 0.9 % 100 mL IVPB  Status:  Discontinued     2 g 200 mL/hr over 30 Minutes Intravenous Every 12 hours 13-Jun-2019 2026 13-Jun-2019 2217   13-Jun-2019 2211  vancomycin variable dose per unstable renal function (pharmacist dosing)  Status:  Discontinued      Does not apply See admin instructions 13-Jun-2019 2211 12/20/18 0827   13-Jun-2019 1845  ceFEPIme (MAXIPIME) 2 g in sodium chloride 0.9 % 100 mL IVPB     2 g 200 mL/hr over 30 Minutes Intravenous  Once 13-Jun-2019 1838 13-Jun-2019 2034      Subjective: Cannot assess, patient is nonverbal   Objective: Vitals:   12/25/18 0322 12/25/18 0717 12/25/18 1121 12/25/18 1523  BP:      Pulse: 62 69    Resp: 16 18    Temp:      TempSrc:      SpO2: 91% 90% 90% (!) 88%  Weight:      Height:        Intake/Output Summary (Last 24 hours) at 12/25/2018 1642 Last data filed at 12/25/2018 1339 Gross per 24 hour  Intake 373.46 ml  Output 1150 ml  Net -776.54 ml   Filed Weights   12/20/18 0348 12/22/18 0300 12/24/18 0411  Weight: 100.2 kg 104.8 kg 107.4 kg    Examination: General exam: Asleep, laying in bed, in nad Respiratory system: Normal respiratory effort, no audible wheezing  Data Reviewed: I have personally reviewed following labs and imaging studies  CBC: Recent Labs  Lab 13-Jun-2019 1834  12/20/18 0637 12/21/18 0624 12/22/18 0637 12/23/18 0622 12/24/18 0634  WBC 10.6*   < > 11.0* 9.4 9.7 11.8* 12.3*  NEUTROABS 8.3*  --   --   --  7.4  --   --   HGB 10.2*   < > 8.6* 7.6* 7.5* 8.8* 8.2*  HCT 35.4*   < > 29.4* 25.4* 24.8* 28.6* 26.5*  MCV 95.4   < > 92.2 91.4 90.5 90.2 90.8  PLT 217   < > 173 166 175 198 226   < > = values in this interval not displayed.  Basic Metabolic Panel: Recent Labs  Lab 12/19/18 0742 12/19/18 1857 12/20/18 4193 12/20/18 1709 12/21/18 0624 12/22/18 0637 12/23/18 0622 12/24/18 0634  NA 148*  --  139  --  137 138 138 138  K 5.7*  --  4.8  --  4.5 5.0 5.4* 5.2*  CL 116*  --  109  --  106 107 108 108  CO2 28  --  23  --  24 23 24 25   GLUCOSE 153*  --  286*  --  294* 240* 289* 162*  BUN 145*  --  119*  --  91* 81* 69* 61*  CREATININE 2.19*  --  1.92*  --  1.65* 1.68* 1.68* 1.62*  CALCIUM 10.0  --  8.9  --  8.4* 8.5* 8.7* 8.9  MG 3.1* 2.7* 2.5* 2.3  --  2.1  --   --   PHOS 3.1  --  2.6  --  2.9 2.9 2.8 2.9   GFR: Estimated Creatinine Clearance: 39.4 mL/min (A) (by C-G formula based on SCr of 1.62 mg/dL (H)). Liver Function Tests: Recent Labs  Lab 12/23/2018 1834  12/20/18 7902 12/21/18 0624 12/22/18 0637 12/23/18 0622 12/24/18  0634  AST 32  --   --   --   --   --   --   ALT 43  --   --   --   --   --   --   ALKPHOS 131*  --   --   --   --   --   --   BILITOT 0.3  --   --   --   --   --   --   PROT 7.0  --   --   --   --   --   --   ALBUMIN 1.7*   < > 1.5* 1.3* 1.3* 1.4* 1.3*   < > = values in this interval not displayed.   No results for input(s): LIPASE, AMYLASE in the last 168 hours. No results for input(s): AMMONIA in the last 168 hours. Coagulation Profile: Recent Labs  Lab 12/25/2018 1834  INR 1.1   Cardiac Enzymes: No results for input(s): CKTOTAL, CKMB, CKMBINDEX, TROPONINI in the last 168 hours. BNP (last 3 results) No results for input(s): PROBNP in the last 8760 hours. HbA1C: No results for input(s): HGBA1C in the last 72 hours. CBG: Recent Labs  Lab 12/23/18 2325 12/24/18 0357 12/24/18 0724 12/24/18 1147 12/24/18 1535  GLUCAP 127* 162* 157* 155* 130*   Lipid Profile: No results for input(s): CHOL, HDL, LDLCALC, TRIG, CHOLHDL, LDLDIRECT in the last 72 hours. Thyroid Function Tests: No results for input(s): TSH, T4TOTAL, FREET4, T3FREE, THYROIDAB in the last 72 hours. Anemia Panel: No results for input(s): VITAMINB12, FOLATE, FERRITIN, TIBC, IRON, RETICCTPCT in the last 72 hours. Sepsis Labs: Recent Labs  Lab 12/19/2018 2125 12/19/18 0742  LATICACIDVEN 1.4 1.1    Recent Results (from the past 240 hour(s))  Blood Culture (routine x 2)     Status: None   Collection Time: 12/12/2018  5:41 PM   Specimen: BLOOD LEFT HAND  Result Value Ref Range Status   Specimen Description BLOOD LEFT HAND  Final   Special Requests   Final    BOTTLES DRAWN AEROBIC AND ANAEROBIC Blood Culture adequate volume   Culture   Final    NO GROWTH 5 DAYS Performed at [facility], [place] [address] [place], [place] [address]    Report Status 12/23/2018  FINAL  Final  SARS Coronavirus 2     Status: None   Collection Time: 11/13/2018  8:39 PM  Result Value Ref Range Status   SARS Coronavirus 2 NOT  DETECTED NOT DETECTED Final    Comment: (NOTE) SARS-CoV-2 target nucleic acids are NOT DETECTED. The SARS-CoV-2 RNA is generally detectable in upper and lower respiratory specimens during the acute phase of infection.  Negative  results do not preclude SARS-CoV-2 infection, do not rule out co-infections with other pathogens, and should not be used as the sole basis for treatment or other patient management decisions.  Negative results must be combined with clinical observations, patient history, and epidemiological information. The expected result is Not Detected. Fact Sheet for Patients: http://www.biofiredefense.com/wp-content/uploads/2020/03/BIOFIRE-COVID -19-patients.pdf Fact Sheet for Healthcare Providers: http://www.biofiredefense.com/wp-content/uploads/2020/03/BIOFIRE-COVID -19-hcp.pdf This test is not yet approved or cleared by the [place] FDA and  has been authorized for detection and/or diagnosis of SARS-CoV-2 by FDA under an Emergency Use Authorization (EUA).  This EUA will remain in effec t (meaning this test can be used) for the duration of  the COVID-19 declaration under Section 564(b)(1) of the Act, 21 U.S.C. section 360bbb-3(b)(1), unless the authorization is terminated or revoked sooner. Performed at [facility], [address] [place], [place] [address]   Expectorated sputum assessment w rflx to resp cult     Status: None   Collection Time: 11/13/2018  9:02 PM   Specimen: Tracheal Aspirate; Sputum  Result Value Ref Range Status   Specimen Description TRACHEAL ASPIRATE  Final   Special Requests NONE  Final   Sputum evaluation   Final    THIS SPECIMEN IS ACCEPTABLE FOR SPUTUM CULTURE Performed at [facility], [address] [place], [place] [address]    Report Status 12/19/2018 FINAL  Final  Culture, respiratory     Status: None   Collection Time: 11/13/2018  9:02 PM   Specimen: Tracheal Aspirate  Result Value Ref Range Status   Specimen  Description TRACHEAL ASPIRATE  Final   Special Requests NONE Reflexed from [id number]  Final   Gram Stain   Final    ABUNDANT WBC PRESENT, PREDOMINANTLY PMN FEW SQUAMOUS EPITHELIAL CELLS PRESENT RARE GRAM NEGATIVE RODS Performed at [facility], [address] [place], [place] [address]    Culture FEW PSEUDOMONAS AERUGINOSA  Final   Report Status 12/21/2018 FINAL  Final   Organism ID, Bacteria PSEUDOMONAS AERUGINOSA  Final      Susceptibility   Pseudomonas aeruginosa - MIC*    CEFTAZIDIME 8 SENSITIVE Sensitive     CIPROFLOXACIN >=4 RESISTANT Resistant     GENTAMICIN <=1 SENSITIVE Sensitive     IMIPENEM 1 SENSITIVE Sensitive     PIP/TAZO 64 SENSITIVE Sensitive     CEFEPIME 4 SENSITIVE Sensitive     * FEW PSEUDOMONAS AERUGINOSA  MRSA PCR Screening     Status: Abnormal   Collection Time: 12/19/18  2:28 AM   Specimen: Nasal Mucosa; Nasopharyngeal  Result Value Ref Range Status   MRSA by PCR POSITIVE (A) NEGATIVE Final    Comment:        The GeneXpert MRSA Assay (FDA approved for NASAL specimens only), is one component of a comprehensive MRSA colonization surveillance program. It is not intended to diagnose MRSA infection nor to guide or monitor treatment for MRSA infections. RESULT CALLED TO, READ BACK BY AND VERIFIED WITH: RN M DEBASTIANI @0632  12/19/18 BY S GEZAHEGN Performed at [facility] [place] [facility], [address]  [place], [place] [address]   Blood Culture (routine x 2)     Status: None   Collection Time: 12/19/18  7:42 AM   Specimen: BLOOD  Result Value Ref Range Status   Specimen Description BLOOD LEFT ANTECUBITAL  Final   Special Requests   Final    BOTTLES DRAWN AEROBIC ONLY Blood Culture adequate volume   Culture   Final    NO GROWTH 5 DAYS Performed at [facility], [address] [place], [place] [address]    Report Status 12/24/2018 FINAL  Final     Radiology Studies: No results found.  Scheduled Meds: . chlorhexidine  5 mL Mouth/Throat BID  .  Chlorhexidine Gluconate Cloth  6 each Topical Q0600  . collagenase   Topical Daily  . fentaNYL  1 patch Transdermal Q72H  . LORazepam  1 mg Intravenous Q6H  . mouth rinse  15 mL Mouth Rinse q12n4p  . mupirocin ointment  1 application Nasal BID  . pantoprazole sodium  40 mg Per Tube Daily  . risperiDONE  0.5 mg Per Tube QHS  . scopolamine  1 patch Transdermal Q72H   Continuous Infusions: . sodium chloride 10 mL/hr at 12/24/18 1702  . HYDROmorphone 0.5 mg/hr (12/24/18 1731)     LOS: 7 days   Rickey BarbaraStephen Sebastiano Luecke, MD [facility] Pager On Amion  If 7PM-7AM, please contact night-coverage 12/25/2018, 4:42 PM

## 2018-12-26 DIAGNOSIS — R6889 Other general symptoms and signs: Secondary | ICD-10-CM

## 2018-12-26 DIAGNOSIS — Z515 Encounter for palliative care: Secondary | ICD-10-CM

## 2018-12-26 DIAGNOSIS — J9620 Acute and chronic respiratory failure, unspecified whether with hypoxia or hypercapnia: Secondary | ICD-10-CM

## 2019-01-06 LAB — ACID FAST CULTURE WITH REFLEXED SENSITIVITIES (MYCOBACTERIA): Acid Fast Culture: NEGATIVE

## 2019-01-07 NOTE — Progress Notes (Addendum)
  Palliative medicine progress note  Patient seen, chart reviewed.  Patient is unresponsive to voice and only minimally responsive to light touch.  Artificial feeding has been stopped, she is on trach collar (6 L / 28%)  She appears comfortable after initiation of a Dilaudid continuous infusion at 1 mg an hour with bolus of 1 mg every 15 minutes, Ativan 1 mg every 6 hours scheduled, scopolamine transdermal as well as Robinul PRN.  She continues to have a fentanyl transdermal micrograms patch on a 50 mcg.  Per report from my colleague as well as chart review, it appears as though family is heading towards residential hospice.  I have reached out to patient's daughter, [name] and left her a message.  Social work is also involved in contacting family for residential hospice disposition  Patient Profile: 73 y.o. female  with past medical history of cardiac arrest in April, 2020, leading to respiratory failure, intubation, (11/06/2018), PEG (11/11/2018), congestive heart failure, diabetes type 2, sacral decubitus, atrial fib, dementia, COPD, admitted on 12/17/2018 with hypotension and worsening kidney function.  Patient has been living at [facility] facility since cardiac arrest in April 2020.  She was admitted on 12/11/2018 after being found to be hypotensive, labs reflecting worsening renal function, pneumonia.  Chest x-ray shows right lower lobe infiltrate  Consult ordered for goals of care specifically to address CODE STATUS  After family meeting held, family has elected full comfort care; she is now a DNR.  Current plans appear to be residential hospice  Plan Continue fentanyl transdermal.  Would recommend discontinuing patch once it has  expired.  Continue Dilaudid continuous infusion at 1 mg an hour and 1 mg every 15 minutes.  Monitor for need for up titration once fentanyl was DC'd.  Continue Ativan 1 mg every 6 hours around-the-clock.  Monitor and titrate for effect.  Continue scopolamine  transdermal and Robinul PRN.  Secretions improved once tube feedings were stopped and IV fluids KVO  Disposition Residential hospice.  I have left a message for patient's daughter, [name] at [phone].  I believe family lives in [place].  I am not sure that they have a preference in terms of residential hospice facilities but will continue trying to reach Ms. [place] to see if she has a preference.  At this point, she does appear stable for transport  Prognosis Less than 2 weeks in the setting of chronic cardiopulmonary failure, trach PEG dependency, pneumonia, worsening renal function.  Unable to tolerate tube feedings secondary to volume overload  Addendum: Called to the bedside by RN, Regino Schultze.  Patient's respiratory pattern had changed.  She passed peacefully at 66.  I did reach out to her daughter [name] again but unfortunately reached her answering machine.  I left the units phone number as well as palliative medicine team number.  Dr. Wyline Copas notified  Thank you, Romona Curls, NP Total time 25 minutes Greater than 50% of time was spent in counseling and coordination of care

## 2019-01-07 NOTE — TOC Progression Note (Signed)
Transition of Care [facility]) - Progression Note    Patient Details  Name: Tayte Childers MRN: [id number] Date of Birth: 25-Sep-1945  [facility]) CM/SW [place], LCSW Phone Number: 12/11/2018, 12:44 PM  Clinical Narrative:    Patient's daughter in agreement with Hospice and does not have a preference since she does not live locally. [facility] does not have beds available today. [facility] is reviewing referral.    Expected Discharge Plan: [place] Barriers to Discharge: Hospice Bed not available  Expected Discharge Plan and Services Expected Discharge Plan: [place] In-house Referral: Clinical Social Work, Hospice / Palliative Care Discharge Planning Services: NA Post Acute Care Choice: Hospice Living arrangements for the past 2 months: [place]                 DME Arranged: (NA)         HH Arranged: NA           Social Determinants of Health (SDOH) Interventions    Readmission Risk Interventions Readmission Risk Prevention Plan 12/25/2018  Transportation Screening Complete  PCP or Specialist Appt within 3-5 Days Complete  HRI or [place] Complete  Social Work Consult for [place] Planning/Counseling Complete  Palliative Care Screening Complete  Medication Review Press photographer) Complete

## 2019-01-07 NOTE — Discharge Summary (Signed)
Death Summary  Shannon NianSheila Desir ZOX:[id number] DOB: 01/27/1946 DOA: 12/12/2018  PCP: System, Pcp Not In  Admit date: 01/01/2019 Date of Death: 22-Feb-2019 Time of Death: 1555 Notification: System, Pcp Not In notified of death of 22-Feb-2019   History of present illness:  73 y.o.femalewith medical history significant ofdementia, COPD, Respiratory failure requiring intubation on 4/16, also status post cardiac arrest requiring CPR. Patient s/p tracheostomy (4/30) and PEG placement (5/15). Patient resides at [facility] since that time.  Patient has been having fever, increasing O2 requirement at [facility]. Was apparently started on cefepime and vanc there recently.  Today apparently also had AKF and hypotension which prompted them to send her in to the ED.  Final Diagnoses:  Acute kidney injury with hyperkalemia Acute on chronic respiratory failure with hypoxia with trach RLL, suspect HCAP Hypernatremia/hyperchloremia Acute normocytic anemia/ Symptomatic anemia/ ?GU bleeding  PAF NSVT Chronic diastolic congestive heart failure Diabetes mellitus, type 2 Dementia  Sacral decubitus ulcer Goals of care  On transfer from LTAC, patient was seen by Nephrology in consultation who did not feel patient was a candidate for long-term dialysis. Renal US was negative for hydronephrosis. Patient otherwise was continued on O2 via trach and broad spectrum antibiotics. COVID test was negative. Patient was given 1 unit of blood this visit. Patient's overall prognosis was noted to be poor and Palliative Care was consulted. After family meeting with Palliative Care, decision was made for transition to full comfort. On the afternoon of 01-Nov-2018 at 1555, patient was pronounced.   The results of significant diagnostics from this hospitalization (including imaging, microbiology, ancillary and laboratory) are listed below for reference.    Significant Diagnostic Studies: [place] Pelvis (transabdominal Only)  Result  Date: 12/21/2018 CLINICAL DATA:  Vaginal bleeding EXAM: TRANSABDOMINAL ULTRASOUND OF PELVIS TECHNIQUE: Transabdominal ultrasound examination of the pelvis was performed including evaluation of the uterus, ovaries, adnexal regions, and pelvic cul-de-sac. COMPARISON:  None FINDINGS: Uterus Measurements: 11.2 x 6.7 x 7.1 cm = volume: 275 mL. Exophytic fundal fibroid noted measuring up to 7.6 cm. Endometrium Thickness: Not well visualized. Right ovary Measurements: 3.4 x 1.4 x 2.3 cm = volume: 7.7 mL. Normal appearance/no adnexal mass. Left ovary Measurements: Not visualized.  No adnexal mass seen. Other findings:  No abnormal free fluid. IMPRESSION: Limited study by body habitus and lack of bladder distention with Foley catheter in place. There appears to be an exophytic 7.6 cm left fundal fibroid. No visible acute process. Electronically Signed   By: Charlett NoseKevin  Dover M.D.   On: 12/21/2018 19:26   [place] Renal  Result Date: 12/29/2018 CLINICAL DATA:  Acute kidney injury EXAM: RENAL / URINARY TRACT ULTRASOUND COMPLETE COMPARISON:  CT 11/20/2018 FINDINGS: Right Kidney: Renal measurements: 11.5 x 5.8 x 5.9 cm = volume: 206.1 mL. Echogenicity normal. Renal cortical thinning. No hydronephrosis Left Kidney: Renal measurements: 11.4 x 6.6 x 5.3 cm = volume: 208.8 mL. Cortical echogenicity normal. No hydronephrosis. Renal cortical thinning. Bladder: Bladder is unremarkable. IMPRESSION: Difficult visualization of kidneys due to habitus. Negative for hydronephrosis. Mild renal cortical thinning. Electronically Signed   By: Jasmine PangKim  Fujinaga M.D.   On: 12/08/2018 23:38   Dg Chest Port 1 View  Result Date: 12/20/2018 CLINICAL DATA:  Respiratory failure. EXAM: PORTABLE CHEST 1 VIEW COMPARISON:  12/25/2018 FINDINGS: The patient is less rotated on today's examination. Tracheostomy tube overlies the airway. The cardiac silhouette is slightly enlarged. The lungs are mildly hypoinflated, improved from prior. Right basilar airspace opacity  has mildly improved. There is minimal left basilar  atelectasis. There may be a small right pleural effusion. No pneumothorax is identified. IMPRESSION: Mildly improved right basilar aeration. Electronically Signed   By: Sebastian AcheAllen  Grady M.D.   On: 12/20/2018 10:21   Dg Chest Port 1 View  Result Date: 12/28/2018 CLINICAL DATA:  Hemoptysis. EXAM: PORTABLE CHEST 1 VIEW COMPARISON:  Radiograph of Nov 17, 2018. FINDINGS: Stable cardiomegaly. Nasogastric tube has been removed. Tracheostomy tube is unchanged in position. No pneumothorax is noted. Left lung is clear. Stable right basilar atelectasis or infiltrate is noted. Bony thorax is unremarkable. IMPRESSION: Stable right basilar atelectasis or infiltrate is noted. Electronically Signed   By: Lupita RaiderJames  Green Jr M.D.   On: 12/29/2018 18:41    Microbiology: Recent Results (from the past 240 hour(s))  Blood Culture (routine x 2)     Status: None   Collection Time: 12/31/2018  5:41 PM   Specimen: BLOOD LEFT HAND  Result Value Ref Range Status   Specimen Description BLOOD LEFT HAND  Final   Special Requests   Final    BOTTLES DRAWN AEROBIC AND ANAEROBIC Blood Culture adequate volume   Culture   Final    NO GROWTH 5 DAYS Performed at [facility], [address] [place], [place] [address]    Report Status 12/23/2018 FINAL  Final  SARS Coronavirus 2     Status: None   Collection Time: 01/01/2019  8:39 PM  Result Value Ref Range Status   SARS Coronavirus 2 NOT DETECTED NOT DETECTED Final    Comment: (NOTE) SARS-CoV-2 target nucleic acids are NOT DETECTED. The SARS-CoV-2 RNA is generally detectable in upper and lower respiratory specimens during the acute phase of infection.  Negative  results do not preclude SARS-CoV-2 infection, do not rule out co-infections with other pathogens, and should not be used as the sole basis for treatment or other patient management decisions.  Negative results must be combined with clinical observations, patient  history, and epidemiological information. The expected result is Not Detected. Fact Sheet for Patients: http://www.biofiredefense.com/wp-content/uploads/2020/03/BIOFIRE-COVID -19-patients.pdf Fact Sheet for Healthcare Providers: http://www.biofiredefense.com/wp-content/uploads/2020/03/BIOFIRE-COVID -19-hcp.pdf This test is not yet approved or cleared by the [place] FDA and  has been authorized for detection and/or diagnosis of SARS-CoV-2 by FDA under an Emergency Use Authorization (EUA).  This EUA will remain in effec t (meaning this test can be used) for the duration of  the COVID-19 declaration under Section 564(b)(1) of the Act, 21 U.S.C. section 360bbb-3(b)(1), unless the authorization is terminated or revoked sooner. Performed at [facility], [address] [place], [place] [address]   Expectorated sputum assessment w rflx to resp cult     Status: None   Collection Time: 12/29/2018  9:02 PM   Specimen: Tracheal Aspirate; Sputum  Result Value Ref Range Status   Specimen Description TRACHEAL ASPIRATE  Final   Special Requests NONE  Final   Sputum evaluation   Final    THIS SPECIMEN IS ACCEPTABLE FOR SPUTUM CULTURE Performed at [facility], [address] [place], [place] [address]    Report Status 12/19/2018 FINAL  Final  Culture, respiratory     Status: None   Collection Time: 12/24/2018  9:02 PM   Specimen: Tracheal Aspirate  Result Value Ref Range Status   Specimen Description TRACHEAL ASPIRATE  Final   Special Requests NONE Reflexed from [id number]  Final   Gram Stain   Final    ABUNDANT WBC PRESENT, PREDOMINANTLY PMN FEW SQUAMOUS EPITHELIAL CELLS PRESENT RARE GRAM NEGATIVE RODS  Performed at [facility], [place] [address], [place], [place] [address]    Culture FEW PSEUDOMONAS AERUGINOSA  Final   Report Status 12/21/2018 FINAL  Final   Organism ID, Bacteria PSEUDOMONAS AERUGINOSA  Final      Susceptibility   Pseudomonas aeruginosa - MIC*     CEFTAZIDIME 8 SENSITIVE Sensitive     CIPROFLOXACIN >=4 RESISTANT Resistant     GENTAMICIN <=1 SENSITIVE Sensitive     IMIPENEM 1 SENSITIVE Sensitive     PIP/TAZO 64 SENSITIVE Sensitive     CEFEPIME 4 SENSITIVE Sensitive     * FEW PSEUDOMONAS AERUGINOSA  MRSA PCR Screening     Status: Abnormal   Collection Time: 12/19/18  2:28 AM   Specimen: Nasal Mucosa; Nasopharyngeal  Result Value Ref Range Status   MRSA by PCR POSITIVE (A) NEGATIVE Final    Comment:        The GeneXpert MRSA Assay (FDA approved for NASAL specimens only), is one component of a comprehensive MRSA colonization surveillance program. It is not intended to diagnose MRSA infection nor to guide or monitor treatment for MRSA infections. RESULT CALLED TO, READ BACK BY AND VERIFIED WITH: RN M DEBASTIANI @0632  12/19/18 BY S GEZAHEGN Performed at [facility], [address] [place], [place] [address]   Blood Culture (routine x 2)     Status: None   Collection Time: 12/19/18  7:42 AM   Specimen: BLOOD  Result Value Ref Range Status   Specimen Description BLOOD LEFT ANTECUBITAL  Final   Special Requests   Final    BOTTLES DRAWN AEROBIC ONLY Blood Culture adequate volume   Culture   Final    NO GROWTH 5 DAYS Performed at [facility], [place] [address], [place], [place] [address]    Report Status 12/24/2018 FINAL  Final     Labs: Basic Metabolic Panel: Recent Labs  Lab 12/19/18 1857  12/20/18 9147 12/20/18 1709 12/21/18 0624 12/22/18 0637 12/23/18 0622 12/24/18 0634  NA  --   --  139  --  137 138 138 138  K  --    < > 4.8  --  4.5 5.0 5.4* 5.2*  CL  --   --  109  --  106 107 108 108  CO2  --   --  23  --  24 23 24 25   GLUCOSE  --   --  286*  --  294* 240* 289* 162*  BUN  --   --  119*  --  91* 81* 69* 61*  CREATININE  --   --  1.92*  --  1.65* 1.68* 1.68* 1.62*  CALCIUM  --   --  8.9  --  8.4* 8.5* 8.7* 8.9  MG 2.7*  --  2.5* 2.3  --  2.1  --   --   PHOS  --   --  2.6  --  2.9 2.9 2.8 2.9    < > = values in this interval not displayed.   Liver Function Tests: Recent Labs  Lab 12/20/18 8295 12/21/18 0624 12/22/18 0637 12/23/18 0622 12/24/18 0634  ALBUMIN 1.5* 1.3* 1.3* 1.4* 1.3*   No results for input(s): LIPASE, AMYLASE in the last 168 hours. No results for input(s): AMMONIA in the last 168 hours. CBC: Recent Labs  Lab 12/20/18 0637 12/21/18 0624 12/22/18 0637 12/23/18 0622 12/24/18 0634  WBC 11.0* 9.4 9.7 11.8* 12.3*  NEUTROABS  --   --  7.4  --   --  HGB 8.6* 7.6* 7.5* 8.8* 8.2*  HCT 29.4* 25.4* 24.8* 28.6* 26.5*  MCV 92.2 91.4 90.5 90.2 90.8  PLT 173 166 175 198 226   Cardiac Enzymes: No results for input(s): CKTOTAL, CKMB, CKMBINDEX, TROPONINI in the last 168 hours. D-Dimer No results for input(s): DDIMER in the last 72 hours. BNP: Invalid input(s): POCBNP CBG: Recent Labs  Lab 12/23/18 2325 12/24/18 0357 12/24/18 0724 12/24/18 1147 12/24/18 1535  GLUCAP 127* 162* 157* 155* 130*   Anemia work up No results for input(s): VITAMINB12, FOLATE, FERRITIN, TIBC, IRON, RETICCTPCT in the last 72 hours. Urinalysis    Component Value Date/Time   COLORURINE RED (A) 12/19/2018 2020   APPEARANCEUR TURBID (A) 12/19/2018 2020   LABSPEC NOT DONE 12/19/2018 2020   PHURINE NOT DONE 12/19/2018 2020   GLUCOSEU (A) 12/19/2018 2020    TEST NOT REPORTED DUE TO COLOR INTERFERENCE OF URINE PIGMENT   HGBUR (A) 12/19/2018 2020    TEST NOT REPORTED DUE TO COLOR INTERFERENCE OF URINE PIGMENT   BILIRUBINUR (A) 12/19/2018 2020    TEST NOT REPORTED DUE TO COLOR INTERFERENCE OF URINE PIGMENT   KETONESUR (A) 12/19/2018 2020    TEST NOT REPORTED DUE TO COLOR INTERFERENCE OF URINE PIGMENT   PROTEINUR (A) 12/19/2018 2020    TEST NOT REPORTED DUE TO COLOR INTERFERENCE OF URINE PIGMENT   NITRITE (A) 12/19/2018 2020    TEST NOT REPORTED DUE TO COLOR INTERFERENCE OF URINE PIGMENT   LEUKOCYTESUR (A) 12/19/2018 2020    TEST NOT REPORTED DUE TO COLOR INTERFERENCE OF URINE  PIGMENT   Sepsis Labs Invalid input(s): PROCALCITONIN,  WBC,  LACTICIDVEN    SIGNED:  Rickey BarbaraStephen Gabryelle Whitmoyer, MD  [facility] 12/27/2018, 4:35 PM  If 7PM-7AM, please contact night-coverage [email or web address] Password [email or web address]

## 2019-01-07 NOTE — TOC Progression Note (Signed)
Transition of Care [facility]) - Progression Note    Patient Details  Name: Shannon Lang MRN: [id number] Date of Birth: [birth year]  Transition of Care [facility]) CM/SW [place], LCSW Phone Number: Dec 31, 2018, 3:10 PM  Clinical Narrative:    [facility] able to accept patient today but patient's daughter not returning calls to complete admission paperwork. CSW will continue to contact her.    Expected Discharge Plan: [place] Barriers to Discharge: Hospice Bed not available  Expected Discharge Plan and Services Expected Discharge Plan: [place] In-house Referral: Clinical Social Work, Hospice / Palliative Care Discharge Planning Services: NA Post Acute Care Choice: Hospice Living arrangements for the past 2 months: [place]                 DME Arranged: (NA)         HH Arranged: NA           Social Determinants of Health (SDOH) Interventions    Readmission Risk Interventions Readmission Risk Prevention Plan 12/25/2018  Transportation Screening Complete  PCP or Specialist Appt within 3-5 Days Complete  HRI or [place] Complete  Social Work Consult for [place] Planning/Counseling Complete  Palliative Care Screening Complete  Medication Review Press photographer) Complete

## 2019-01-07 NOTE — Progress Notes (Signed)
PROGRESS NOTE    Shannon NianSheila Mayotte  ZOX:[id number] DOB: 10/22/1945 DOA: 12/21/2018 PCP: System, Pcp Not In    Brief Narrative:  73 y.o.femalewith medical history significant ofdementia, COPD, Respiratory failure requiring intubation on 4/16, also apparently cardiac arrest requiring CPR. Patient s/p tracheostomy (4/30) and PEG placement (5/15). Patient resides at [facility] since that time.  Patient has been having fever, increasing O2 requirement at [facility]. Was apparently started on cefepime and vanc there recently.  Today apparently also had AKF and hypotension which prompted them to send her in to the ED.  Assessment & Plan:   Principal Problem:   AKI (acute kidney injury) (HCC) Active Problems:   Acute on chronic respiratory failure with hypoxia (HCC)   A-fib (HCC)   Chronic congestive heart failure with left ventricular diastolic dysfunction (HCC)   COPD, severe (HCC)   Dementia (HCC)   DM2 (diabetes mellitus, type 2) (HCC)   Fever   Hyperkalemia   Sacral decubitus ulcer   Respiratory failure (HCC)   Goals of care, counseling/discussion   Palliative care by specialist   Tracheostomy status (HCC)   Terminal care   Comfort measures only status   Copious oral secretions  Acute kidney injury with hyperkalemia -Creatinine on admission 2.28, baseline unknown. Most recent Cr of 1.62 -Nephrology consulted and appreciated, feels patient is not a candidate for long-term dialysis.  Also feels that her creatinine and GFR may be a lot worse than the calculated GFR due to poor muscle mass.  Patient's azotemia is out of proportion to her increase in creatinine. -Renal ultrasound: Difficult visualization of kidneys due to habitus.  Negative for hydronephrosis.  Mild renal cortical thinning. -Appreciate input by Palliative Care. Plan for transition to full comfort care -Pending placement to residential hospice, discussed with SW  Acute on chronic respiratory failure with hypoxia  with trach -patient with tracheostomy  -PCCM consulted and appreciated -Cont O2 as needed. Now on full comfort  RLL, suspect HCAP -Supposedly per PCCM note, patient had fever of 100, however patient was mildly hypothermic with of 96.26F -CXR shows stable right basilar atelectasis vs infiltrate -no leukocytosis, fever, or tachycarida noted -lactic acid normal -Patient was initially continued on vancomycin and cefepime -PCCM added urine strep pneumoniae and legionella antigens -urine Strep pneumo Ag negative -Respiratory culture: Pseudomonas aeruginosa - given culture results, vancomycin discontinued  -COVID negative -Now transitioned to full comfort  Hypernatremia/hyperchloremia -Only on half-normal saline -Sodium improved. -Now full comfort  Acute normocytic anemia/ Symptomatic anemia/ ?GU bleeding  -hemoglobin noted to have a low of 7.5 (suspect baseline 9-10)  -possibly dilutional as patient has been on IVF -FOBT negative -Anemia panel shows adequate iron and storage -appears bleeding is vaginal/GU in origin -Transvaginal US: limited due to body habits. Exophytic 7.6cm left fundal fibroid. No visible acute process -Dr. Catha GosselinMikhail had discussed Dr. Macon LargeAnyanwu (Ob/gyn), recommended megace 80mg  BID until bleeding subsides. Patient is not a candidate for hormonal type of treatment or invasive procedures. Ultimately she would need an endometrial biopsy to further investigate cause of bleeding.  -discontinued lovenox -Now on full comfort care  PAF -metoprolol currently held -currently in sinus rhythm -cont per comfort measures  NSVT -cont with comfort measures only  Chronic diastolic congestive heart failure -per medical history -Appears euvolemic and compensated -monitor intake/output, daily weights -Echocardiogram EF 55-60%, LV diastolic parameters are indeterminate -Now comfort care  Diabetes mellitus, type 2 -Continue ISS and CBG monitoring -restarted lantus 6u BID,  added novolog 3u q4h for better control -Now  cont per comfort measures  Dementia  -Cont with comfort measures  Sacral decubitus ulcer -stage 3-4 -present on admission -Comfort care  Goals of care -patient with recent history of arrest, tracheostomy  -Presently full code -Palliative care consulted and appreciated -Family has decided on transitioning to full comfort. Anticipate possible residential hospice. Have discussed with SW   DVT prophylaxis: Comfort measures Code Status: DNR/comfort Family Communication: Patient in room, family not at bedside Disposition Plan: Hospital death vs residential hospice  Consultants:   Critical care  Palliative care  Procedures:  US Renal Transvaginal US  Antimicrobials: Anti-infectives (From admission, onward)   Start     Dose/Rate Route Frequency Ordered Stop   12/22/18 1200  ceFEPIme (MAXIPIME) 2 g in sodium chloride 0.9 % 100 mL IVPB  Status:  Discontinued     2 g 200 mL/hr over 30 Minutes Intravenous Every 12 hours 12/22/18 1112 12/24/18 1652   12/20/18 0830  vancomycin (VANCOCIN) 1,500 mg in sodium chloride 0.9 % 500 mL IVPB  Status:  Discontinued     1,500 mg 250 mL/hr over 120 Minutes Intravenous Every 48 hours 12/20/18 0827 12/22/18 0841   12/19/18 1900  ceFEPIme (MAXIPIME) 2 g in sodium chloride 0.9 % 100 mL IVPB  Status:  Discontinued     2 g 200 mL/hr over 30 Minutes Intravenous Every 24 hours 01-15-2019 2217 12/22/18 1112   12/19/18 0800  ceFEPIme (MAXIPIME) 2 g in sodium chloride 0.9 % 100 mL IVPB  Status:  Discontinued     2 g 200 mL/hr over 30 Minutes Intravenous Every 12 hours Jan 15, 2019 2026 01/15/19 2217   01/15/19 2211  vancomycin variable dose per unstable renal function (pharmacist dosing)  Status:  Discontinued      Does not apply See admin instructions January 15, 2019 2211 12/20/18 0827   2019/01/15 1845  ceFEPIme (MAXIPIME) 2 g in sodium chloride 0.9 % 100 mL IVPB     2 g 200 mL/hr over 30 Minutes Intravenous  Once  2019/01/15 1838 01/15/19 2034      Subjective: Unable to assess, patient is nonverbal  Objective: Vitals:   12/25/18 2335 12/19/2018 0352 01/03/2019 0815 12/31/2018 1101  BP:      Pulse: 78 76 76 74  Resp: 18 18 20 18   Temp:      TempSrc:      SpO2: 90% 91% (!) 88% 90%  Weight:      Height:        Intake/Output Summary (Last 24 hours) at 12/17/2018 1535 Last data filed at 12/16/2018 0546 Gross per 24 hour  Intake 302.46 ml  Output 200 ml  Net 102.46 ml   Filed Weights   12/20/18 0348 12/22/18 0300 12/24/18 0411  Weight: 100.2 kg 104.8 kg 107.4 kg    Examination: General exam: Awake, laying in bed, in nad Respiratory system: Normal respiratory effort, no audible wheezing   Data Reviewed: I have personally reviewed following labs and imaging studies  CBC: Recent Labs  Lab 12/20/18 0637 12/21/18 0624 12/22/18 0637 12/23/18 0622 12/24/18 0634  WBC 11.0* 9.4 9.7 11.8* 12.3*  NEUTROABS  --   --  7.4  --   --   HGB 8.6* 7.6* 7.5* 8.8* 8.2*  HCT 29.4* 25.4* 24.8* 28.6* 26.5*  MCV 92.2 91.4 90.5 90.2 90.8  PLT 173 166 175 198 270   Basic Metabolic Panel: Recent Labs  Lab 12/19/18 1857 12/20/18 6237 12/20/18 1709 12/21/18 6283 12/22/18 1517 12/23/18 0622 12/24/18 0634  NA  --  139  --  137 138 138 138  K  --  4.8  --  4.5 5.0 5.4* 5.2*  CL  --  109  --  106 107 108 108  CO2  --  23  --  24 23 24 25   GLUCOSE  --  286*  --  294* 240* 289* 162*  BUN  --  119*  --  91* 81* 69* 61*  CREATININE  --  1.92*  --  1.65* 1.68* 1.68* 1.62*  CALCIUM  --  8.9  --  8.4* 8.5* 8.7* 8.9  MG 2.7* 2.5* 2.3  --  2.1  --   --   PHOS  --  2.6  --  2.9 2.9 2.8 2.9   GFR: Estimated Creatinine Clearance: 39.4 mL/min (A) (by C-G formula based on SCr of 1.62 mg/dL (H)). Liver Function Tests: Recent Labs  Lab 12/20/18 [id number] 12/21/18 0624 12/22/18 0637 12/23/18 0622 12/24/18 0634  ALBUMIN 1.5* 1.3* 1.3* 1.4* 1.3*   No results for input(s): LIPASE, AMYLASE in the last 168 hours. No  results for input(s): AMMONIA in the last 168 hours. Coagulation Profile: No results for input(s): INR, PROTIME in the last 168 hours. Cardiac Enzymes: No results for input(s): CKTOTAL, CKMB, CKMBINDEX, TROPONINI in the last 168 hours. BNP (last 3 results) No results for input(s): PROBNP in the last 8760 hours. HbA1C: No results for input(s): HGBA1C in the last 72 hours. CBG: Recent Labs  Lab 12/23/18 2325 12/24/18 0357 12/24/18 0724 12/24/18 1147 12/24/18 1535  GLUCAP 127* 162* 157* 155* 130*   Lipid Profile: No results for input(s): CHOL, HDL, LDLCALC, TRIG, CHOLHDL, LDLDIRECT in the last 72 hours. Thyroid Function Tests: No results for input(s): TSH, T4TOTAL, FREET4, T3FREE, THYROIDAB in the last 72 hours. Anemia Panel: No results for input(s): VITAMINB12, FOLATE, FERRITIN, TIBC, IRON, RETICCTPCT in the last 72 hours. Sepsis Labs: No results for input(s): PROCALCITON, LATICACIDVEN in the last 168 hours.  Recent Results (from the past 240 hour(s))  Blood Culture (routine x 2)     Status: None   Collection Time: 2019/02/20  5:41 PM   Specimen: BLOOD LEFT HAND  Result Value Ref Range Status   Specimen Description BLOOD LEFT HAND  Final   Special Requests   Final    BOTTLES DRAWN AEROBIC AND ANAEROBIC Blood Culture adequate volume   Culture   Final    NO GROWTH 5 DAYS Performed at [facility], [address] [place], [place] [id number]    Report Status 12/23/2018 FINAL  Final  SARS Coronavirus 2     Status: None   Collection Time: 2019/02/20  8:39 PM  Result Value Ref Range Status   SARS Coronavirus 2 NOT DETECTED NOT DETECTED Final    Comment: (NOTE) SARS-CoV-2 target nucleic acids are NOT DETECTED. The SARS-CoV-2 RNA is generally detectable in upper and lower respiratory specimens during the acute phase of infection.  Negative  results do not preclude SARS-CoV-2 infection, do not rule out co-infections with other pathogens, and should not be used as the sole  basis for treatment or other patient management decisions.  Negative results must be combined with clinical observations, patient history, and epidemiological information. The expected result is Not Detected. Fact Sheet for Patients: http://www.biofiredefense.com/wp-content/uploads/2020/03/BIOFIRE-COVID -19-patients.pdf Fact Sheet for Healthcare Providers: http://www.biofiredefense.com/wp-content/uploads/2020/03/BIOFIRE-COVID -19-hcp.pdf This test is not yet approved or cleared by the [place] FDA and  has been authorized for detection and/or diagnosis of SARS-CoV-2 by FDA under an Emergency Use Authorization (  EUA).  This EUA will remain in effec t (meaning this test can be used) for the duration of  the COVID-19 declaration under Section 564(b)(1) of the Act, 21 U.S.C. section 360bbb-3(b)(1), unless the authorization is terminated or revoked sooner. Performed at [facility], [address], [place], [place] [address]   Expectorated sputum assessment w rflx to resp cult     Status: None   Collection Time: 12/17/2018  9:02 PM   Specimen: Tracheal Aspirate; Sputum  Result Value Ref Range Status   Specimen Description TRACHEAL ASPIRATE  Final   Special Requests NONE  Final   Sputum evaluation   Final    THIS SPECIMEN IS ACCEPTABLE FOR SPUTUM CULTURE Performed at [facility], [address] [place], [place] [address]    Report Status 12/19/2018 FINAL  Final  Culture, respiratory     Status: None   Collection Time: 12/08/2018  9:02 PM   Specimen: Tracheal Aspirate  Result Value Ref Range Status   Specimen Description TRACHEAL ASPIRATE  Final   Special Requests NONE Reflexed from [id number]  Final   Gram Stain   Final    ABUNDANT WBC PRESENT, PREDOMINANTLY PMN FEW SQUAMOUS EPITHELIAL CELLS PRESENT RARE GRAM NEGATIVE RODS Performed at [facility], [address] [place], [place] [address]    Culture FEW PSEUDOMONAS AERUGINOSA  Final   Report Status 12/21/2018  FINAL  Final   Organism ID, Bacteria PSEUDOMONAS AERUGINOSA  Final      Susceptibility   Pseudomonas aeruginosa - MIC*    CEFTAZIDIME 8 SENSITIVE Sensitive     CIPROFLOXACIN >=4 RESISTANT Resistant     GENTAMICIN <=1 SENSITIVE Sensitive     IMIPENEM 1 SENSITIVE Sensitive     PIP/TAZO 64 SENSITIVE Sensitive     CEFEPIME 4 SENSITIVE Sensitive     * FEW PSEUDOMONAS AERUGINOSA  MRSA PCR Screening     Status: Abnormal   Collection Time: 12/19/18  2:28 AM   Specimen: Nasal Mucosa; Nasopharyngeal  Result Value Ref Range Status   MRSA by PCR POSITIVE (A) NEGATIVE Final    Comment:        The GeneXpert MRSA Assay (FDA approved for NASAL specimens only), is one component of a comprehensive MRSA colonization surveillance program. It is not intended to diagnose MRSA infection nor to guide or monitor treatment for MRSA infections. RESULT CALLED TO, READ BACK BY AND VERIFIED WITH: RN M DEBASTIANI  12/19/18 BY S GEZAHEGN Performed at [facility], [address], [place], [place] [address]   Blood Culture (routine x 2)     Status: None   Collection Time: 12/19/18  7:42 AM   Specimen: BLOOD  Result Value Ref Range Status   Specimen Description BLOOD LEFT ANTECUBITAL  Final   Special Requests   Final    BOTTLES DRAWN AEROBIC ONLY Blood Culture adequate volume   Culture   Final    NO GROWTH 5 DAYS Performed at [facility], [address], [place], [place] [address]    Report Status 12/24/2018 FINAL  Final     Radiology Studies: No results found.  Scheduled Meds: . chlorhexidine  5 mL Mouth/Throat BID  . collagenase   Topical Daily  . fentaNYL  1 patch Transdermal Q72H  . LORazepam  1 mg Intravenous Q6H  . mouth rinse  15 mL Mouth Rinse q12n4p  . mupirocin ointment  1 application Nasal BID  . pantoprazole sodium  40 mg Per  Tube Daily  . risperiDONE  0.5 mg Per Tube QHS  . scopolamine  1 patch Transdermal Q72H   Continuous Infusions: . sodium chloride 10 mL/hr  at Oct 09, 2018 0550  . HYDROmorphone 1 mg/hr (12/25/18 1820)     LOS: 8 days   Rickey BarbaraStephen Haylie Mccutcheon, MD [facility] Pager On Amion  If 7PM-7AM, please contact night-coverage 11/01/2018, 3:35 PM

## 2019-01-07 NOTE — Progress Notes (Signed)
Remaining Hydromorphone (DILAUDID) infusion wasted in stericycle. Approximately 35 mL wasted.  Waste witness Laurena Spies RN.

## 2019-01-07 NOTE — TOC Initial Note (Signed)
Transition of Care [facility]) - Initial/Assessment Note    Patient Details  Name: Shannon Lang MRN: [id number] Date of Birth: Jan 26, 1946  [facility]) CM/SW Contact:    Benard Halsted, LCSW Phone Number: 12/28/2018, 11:49 AM  Clinical Narrative:                 CSW left voicemail for patient's daughter regarding residential hospice placement. Awaiting call back.   Expected Discharge Plan: [place] Barriers to Discharge: Hospice Bed not available   Patient Goals and CMS Choice Patient states their goals for this hospitalization and ongoing recovery are:: Comfort CMS Medicare.gov Compare Post Acute Care list provided to:: Patient Represenative (must comment)(Daughter) Choice offered to / list presented to : Adult Children  Expected Discharge Plan and Services Expected Discharge Plan: [place] In-house Referral: Clinical Social Work, Hospice / Palliative Care Discharge Planning Services: NA Post Acute Care Choice: Hospice Living arrangements for the past 2 months: [facility]                 DME Arranged: (NA)         HH Arranged: NA          Prior Living Arrangements/Services Living arrangements for the past 2 months: [place] Lives with:: Facility Resident Patient language and need for interpreter reviewed:: Yes Do you feel safe going back to the place where you live?: No      Need for Family Participation in Patient Care: Yes (Comment) Care giver support system in place?: Yes (comment)   Criminal Activity/Legal Involvement Pertinent to Current Situation/Hospitalization: No - Comment as needed  Activities of Daily Living Home Assistive Devices/Equipment: Vent/Trach supplies, Enteral Feeding Supplies ADL Screening (condition at time of admission) Patient's cognitive ability adequate to safely complete daily activities?: No Is the patient deaf or have difficulty hearing?: No Does the patient have  difficulty seeing, even when wearing glasses/contacts?: No Does the patient have difficulty concentrating, remembering, or making decisions?: No Patient able to express need for assistance with ADLs?: Yes Does the patient have difficulty dressing or bathing?: Yes Independently performs ADLs?: No Does the patient have difficulty walking or climbing stairs?: Yes Weakness of Legs: Both Weakness of Arms/Hands: Both  Permission Sought/Granted Permission sought to share information with : Facility Sport and exercise psychologist, Family Supports Permission granted to share information with : No  Share Information with NAME: [name]  Permission granted to share info w AGENCY: Hospice  Permission granted to share info w Relationship: Daughter  Permission granted to share info w Contact Information: [phone]  Emotional Assessment Appearance:: Appears stated age Attitude/Demeanor/Rapport: Unable to Assess Affect (typically observed): Unable to Assess Orientation: : (Unable to assess) Alcohol / Substance Use: Not Applicable Psych Involvement: No (comment)  Admission diagnosis:  AKI (acute kidney injury) ([place]) [N17.9] Patient Active Problem List   Diagnosis Date Noted  . Terminal care   . Comfort measures only status   . Tracheostomy status ([place])   . Respiratory failure ([place])   . Goals of care, counseling/discussion   . Palliative care by specialist   . AKI (acute kidney injury) ([place]) 10-Jan-2019  . Dementia ([place]) Jan 10, 2019  . DM2 (diabetes mellitus, type 2) ([place]) January 10, 2019  . Fever January 10, 2019  . Hyperkalemia 10-Jan-2019  . Sacral decubitus ulcer 01-10-19  . Acute on chronic respiratory failure with hypoxia ([place])   . Aspiration pneumonia due to gastric secretions ([place])   . A-fib ([place])   . Chronic congestive heart failure  with left ventricular diastolic dysfunction (HCC)   . COPD, severe (HCC)    PCP:  System, Pcp Not In Pharmacy:  No Pharmacies Listed    Social Determinants of  Health (SDOH) Interventions    Readmission Risk Interventions Readmission Risk Prevention Plan 12/25/2018  Transportation Screening Complete  PCP or Specialist Appt within 3-5 Days Complete  HRI or Home Care Consult Complete  Social Work Consult for Recovery Care Planning/Counseling Complete  Palliative Care Screening Complete  Medication Review Oceanographer(RN Care Manager) Complete

## 2019-01-07 NOTE — Progress Notes (Signed)
Patient passed peacefully at 37.  Was DNR at the time.  Verified by Nicholes Calamity, RN and Laurena Spies, RN.  Palliative Care team to contact family.  Dr. Wyline Copas made aware.  Referral to [facility] made, referral number [id number].

## 2019-01-07 DEATH — deceased

## 2021-03-18 IMAGING — DX PORTABLE CHEST - 1 VIEW
1 series · 2 of 2 positions shown · non-contrast
Comparison: None.

CLINICAL DATA: Respiratory failure

EXAM:
PORTABLE CHEST 1 VIEW

[Series 1: chest · 0.14mm/px · 2 of 2 slices shown]
[im 1/2]
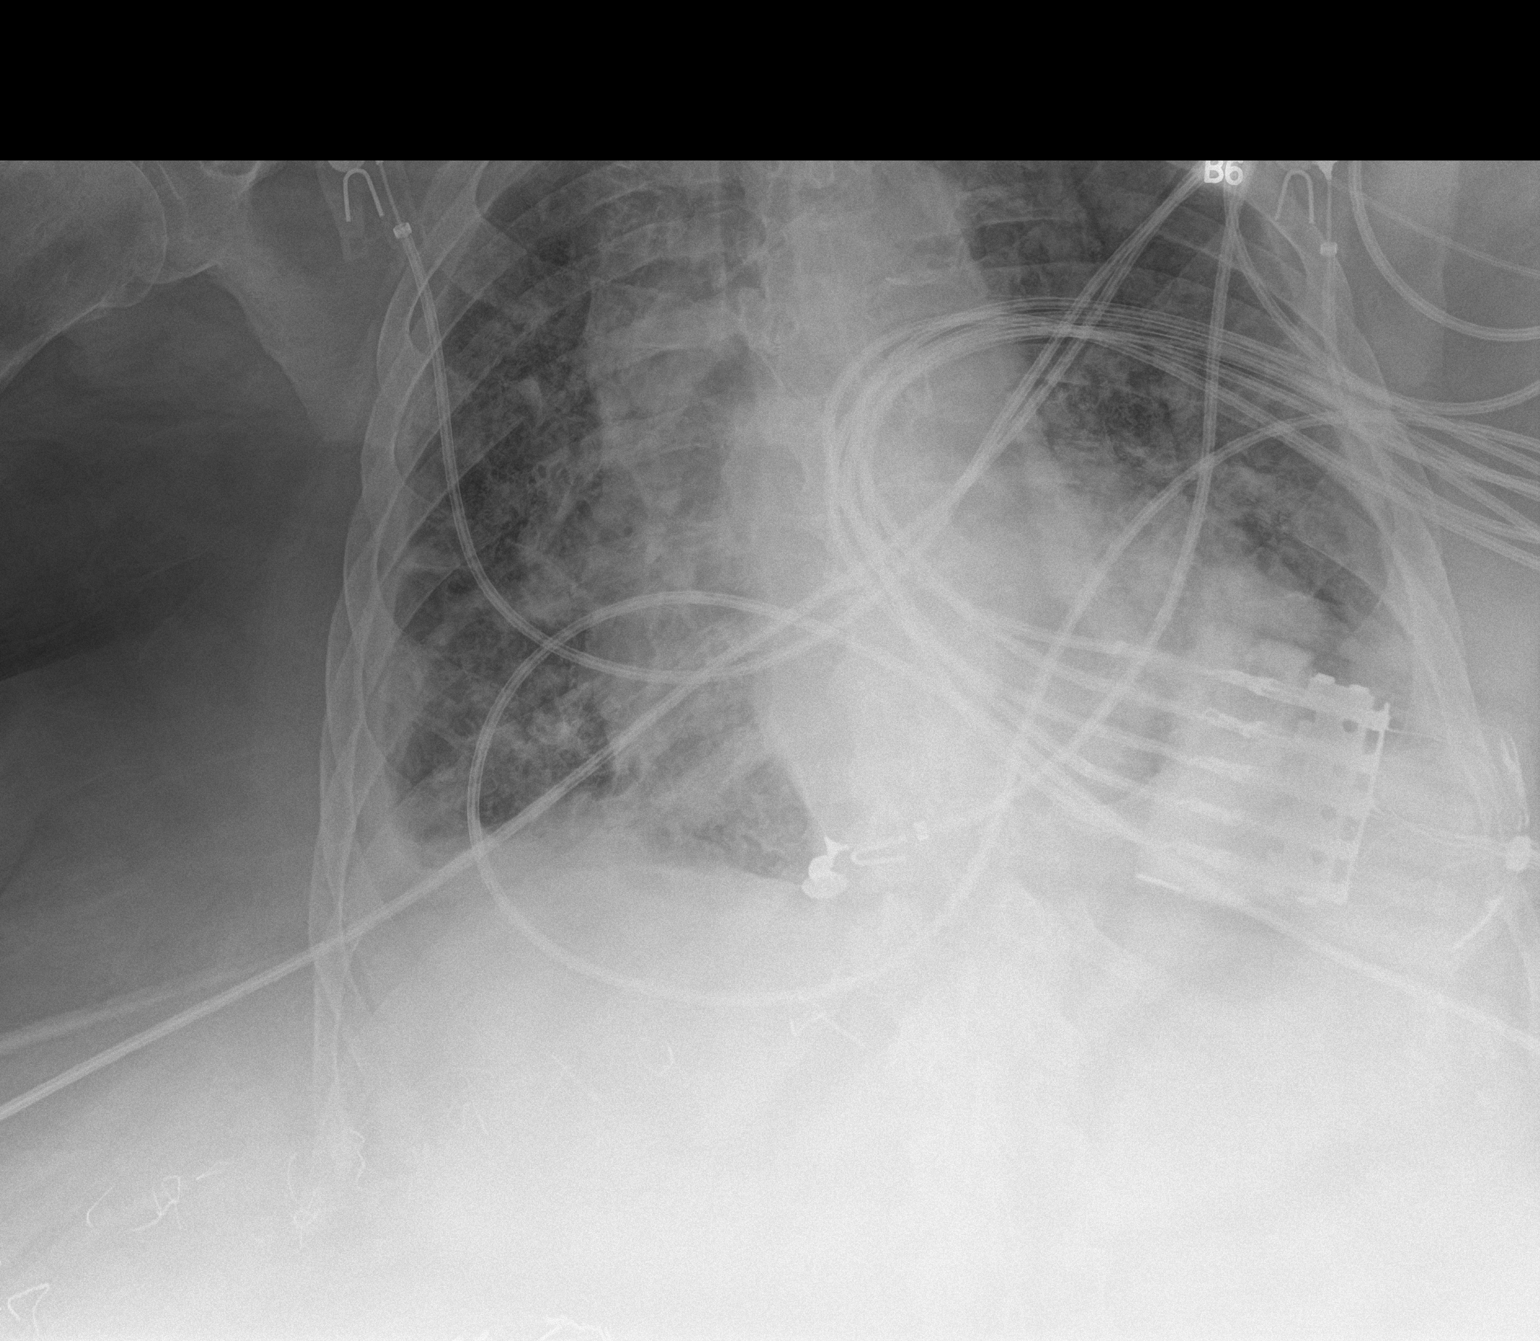
[im 2/2]
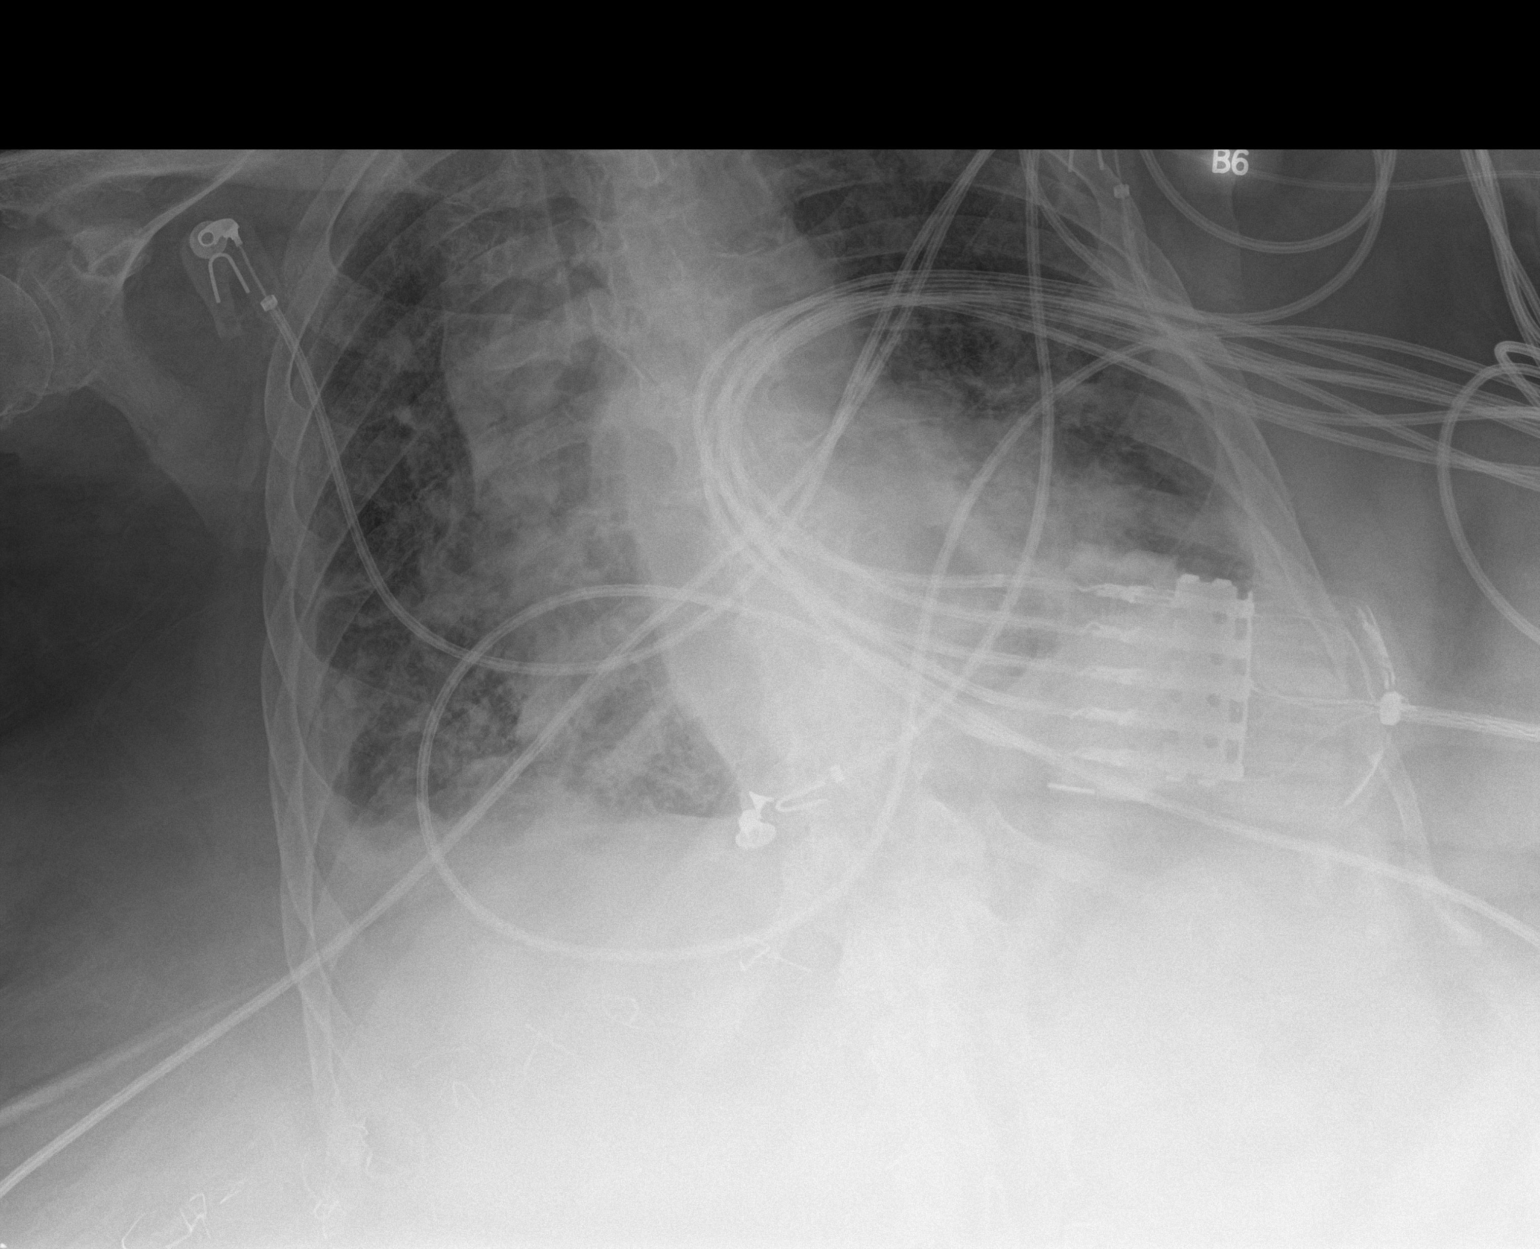

[2 of 2 positions shown; findings below may reference images not displayed]

FINDINGS: Left PICC terminates in the left brachiocephalic vein to the right
of midline. Mild cardiomegaly. Aortic arch atherosclerosis.
Mediastinal contour otherwise within normal limits. No pneumothorax.
Small bilateral pleural effusions, right greater than left. Diffuse
hazy parahilar lung opacities.
IMPRESSION: 1. Mild cardiomegaly. Diffuse hazy parahilar lung opacities,
consider pulmonary edema or atypical infection.
2. Small bilateral pleural effusions, right greater than left.
3. Left PICC terminates in the left brachiocephalic vein to the
right of midline.

## 2021-04-06 IMAGING — DX PORTABLE ABDOMEN - 1 VIEW
1 series · 1 of 1 positions shown · non-contrast
Comparison: November 06, 2018 study obtained earlier in the day

CLINICAL DATA: Nasogastric tube placement

EXAM:
PORTABLE ABDOMEN - 1 VIEW

[abdomen]
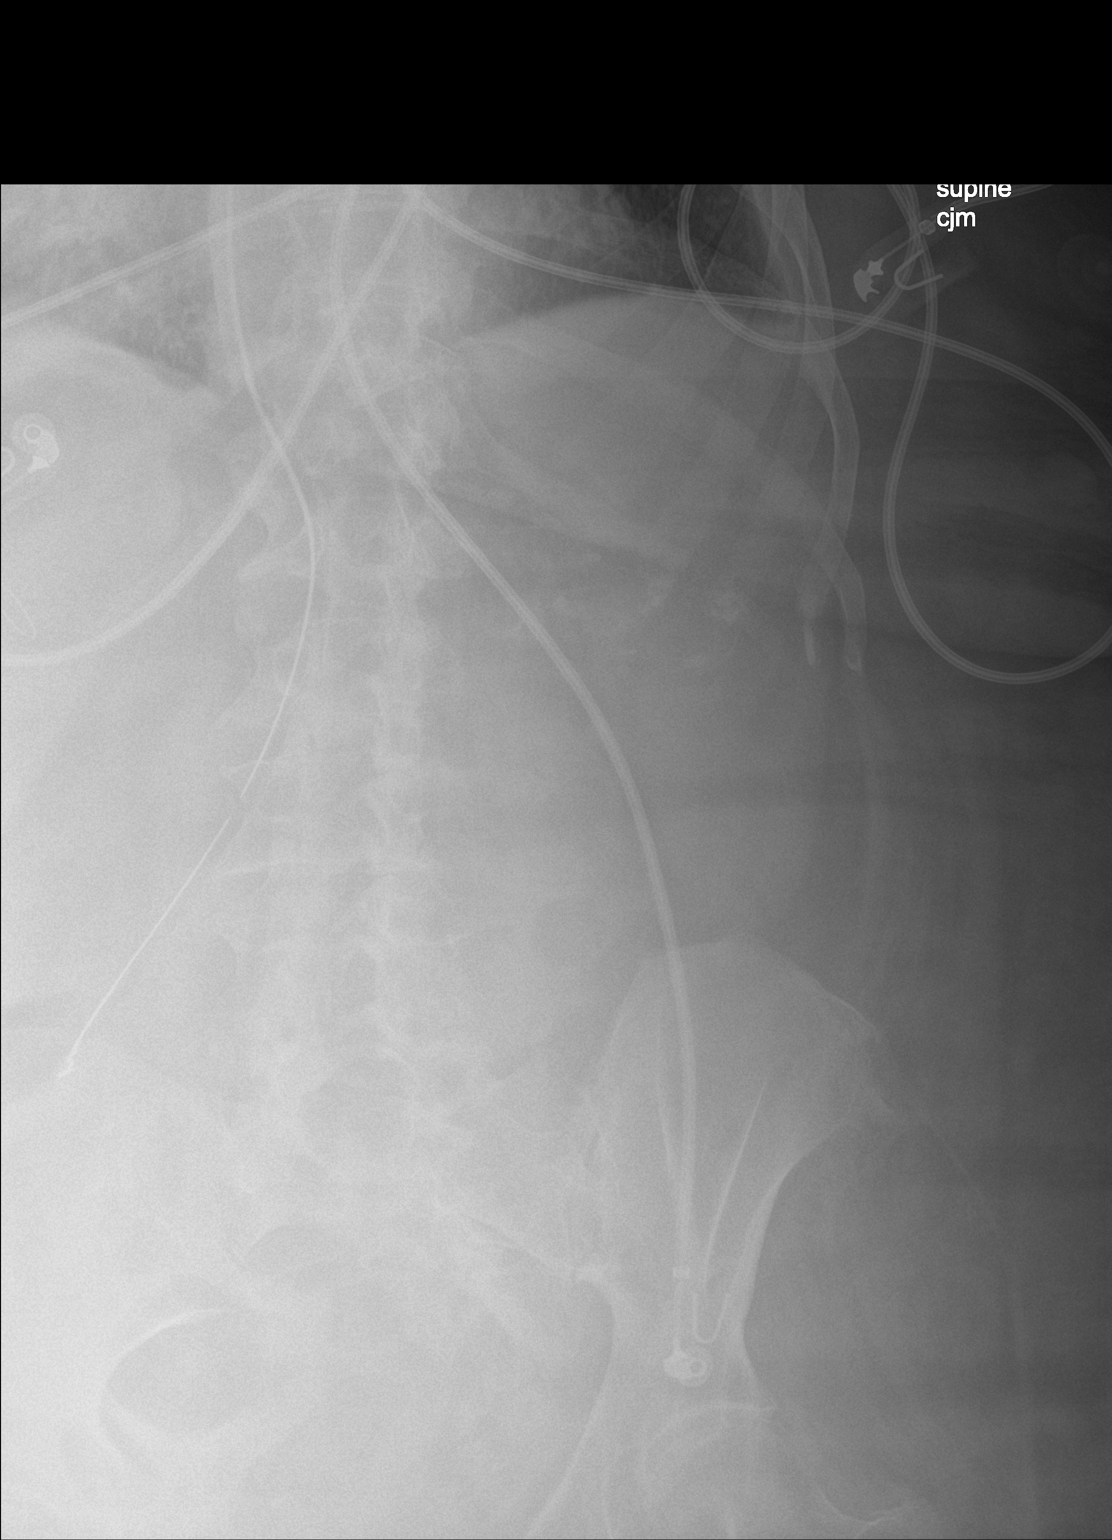

[1 of 1 positions shown; findings below may reference images not displayed]

FINDINGS: Nasogastric tube tip and side port are in the distal body of the
stomach. There is no bowel dilatation or air-fluid level to suggest
bowel obstruction. No evident free air. Visualized lung bases clear.
IMPRESSION: Nasogastric tube tip and side port now in distal body of stomach
region. No bowel obstruction or free air evident. Visualized lung
bases clear.

## 2021-04-06 IMAGING — DX PORTABLE ABDOMEN - 1 VIEW
1 series · 1 of 1 positions shown · non-contrast
Comparison: Radiograph October 25, 2018.

CLINICAL DATA: Nasogastric tube placement.

EXAM:
PORTABLE ABDOMEN - 1 VIEW

[abdomen]
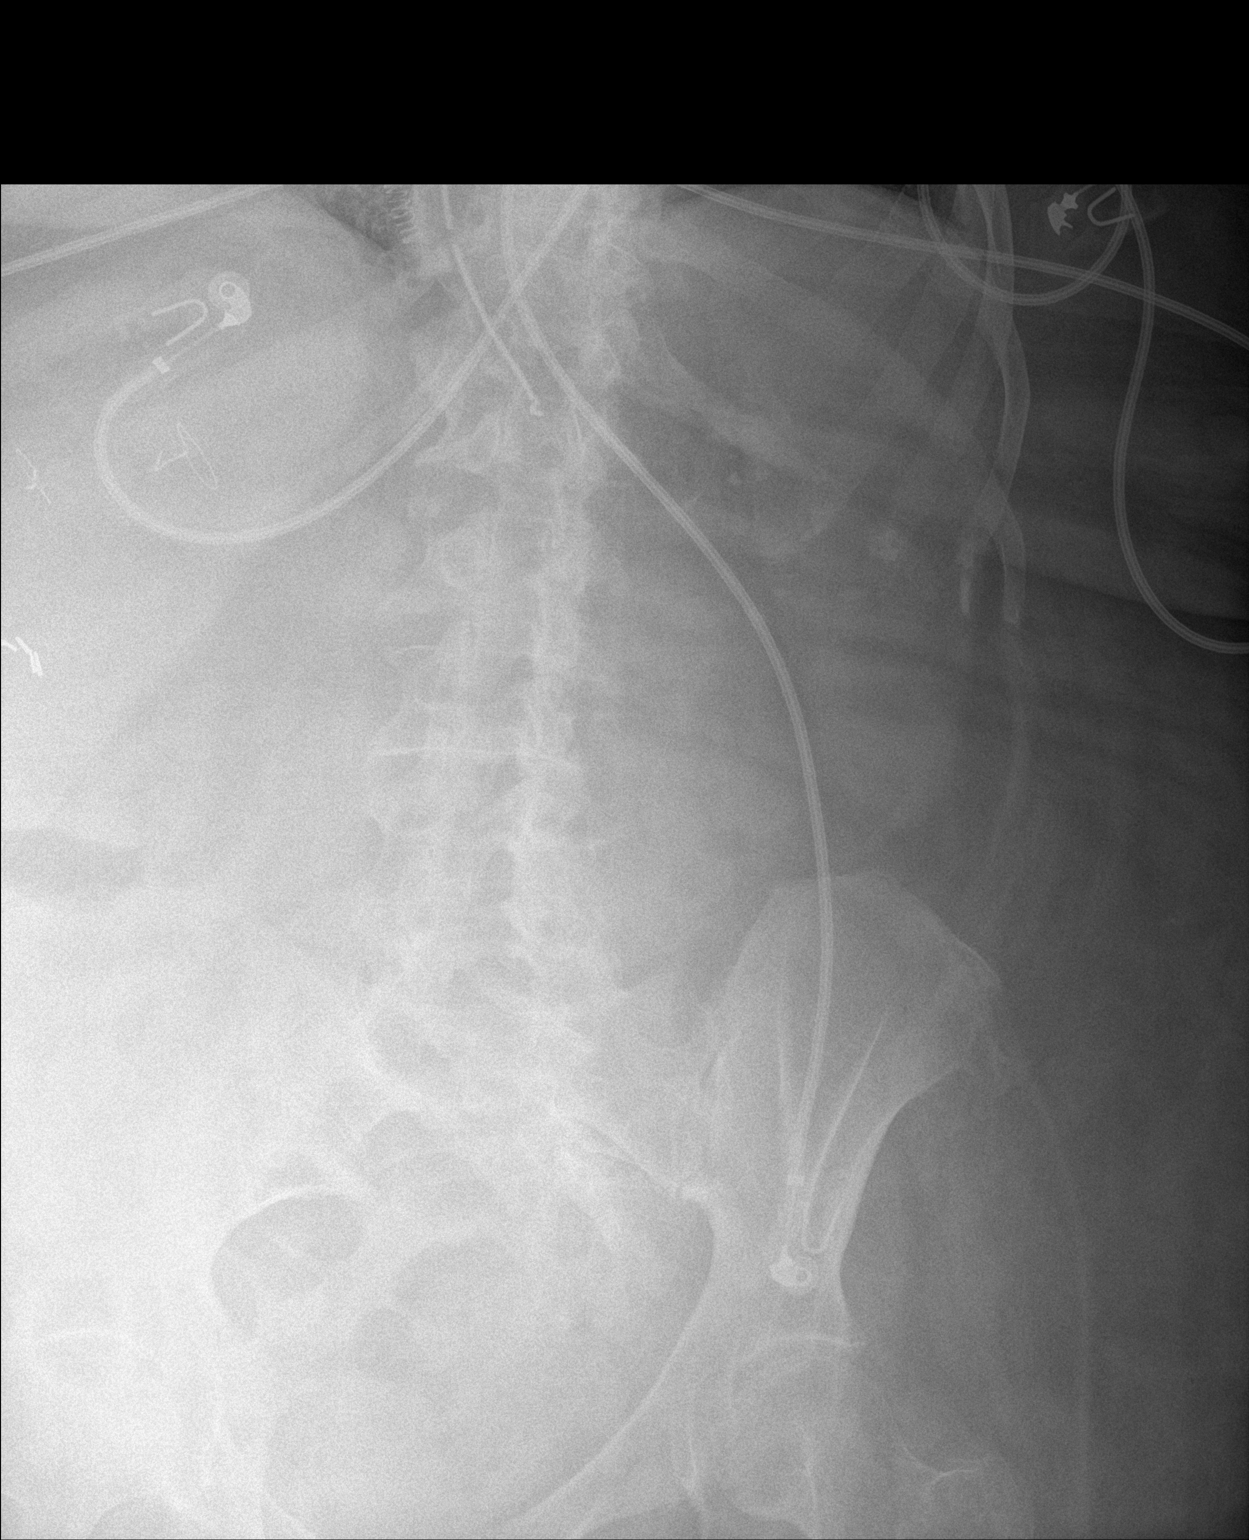

[1 of 1 positions shown; findings below may reference images not displayed]

FINDINGS: The bowel gas pattern is normal. Nasogastric tube tip is seen at
expected position of gastroesophageal junction, with side hole in
distal esophagus. No radio-opaque calculi or other significant
radiographic abnormality are seen.
IMPRESSION: Nasogastric tube tip seen in expected position of gastroesophageal
junction; advancement is recommended. No evidence of bowel
obstruction or ileus.

## 2021-04-09 IMAGING — DX PORTABLE CHEST - 1 VIEW
1 series · 1 of 1 positions shown · non-contrast
Comparison: Chest x-ray dated October 28, 2018.

CLINICAL DATA: Respiratory failure.

EXAM:
PORTABLE CHEST 1 VIEW

[chest]
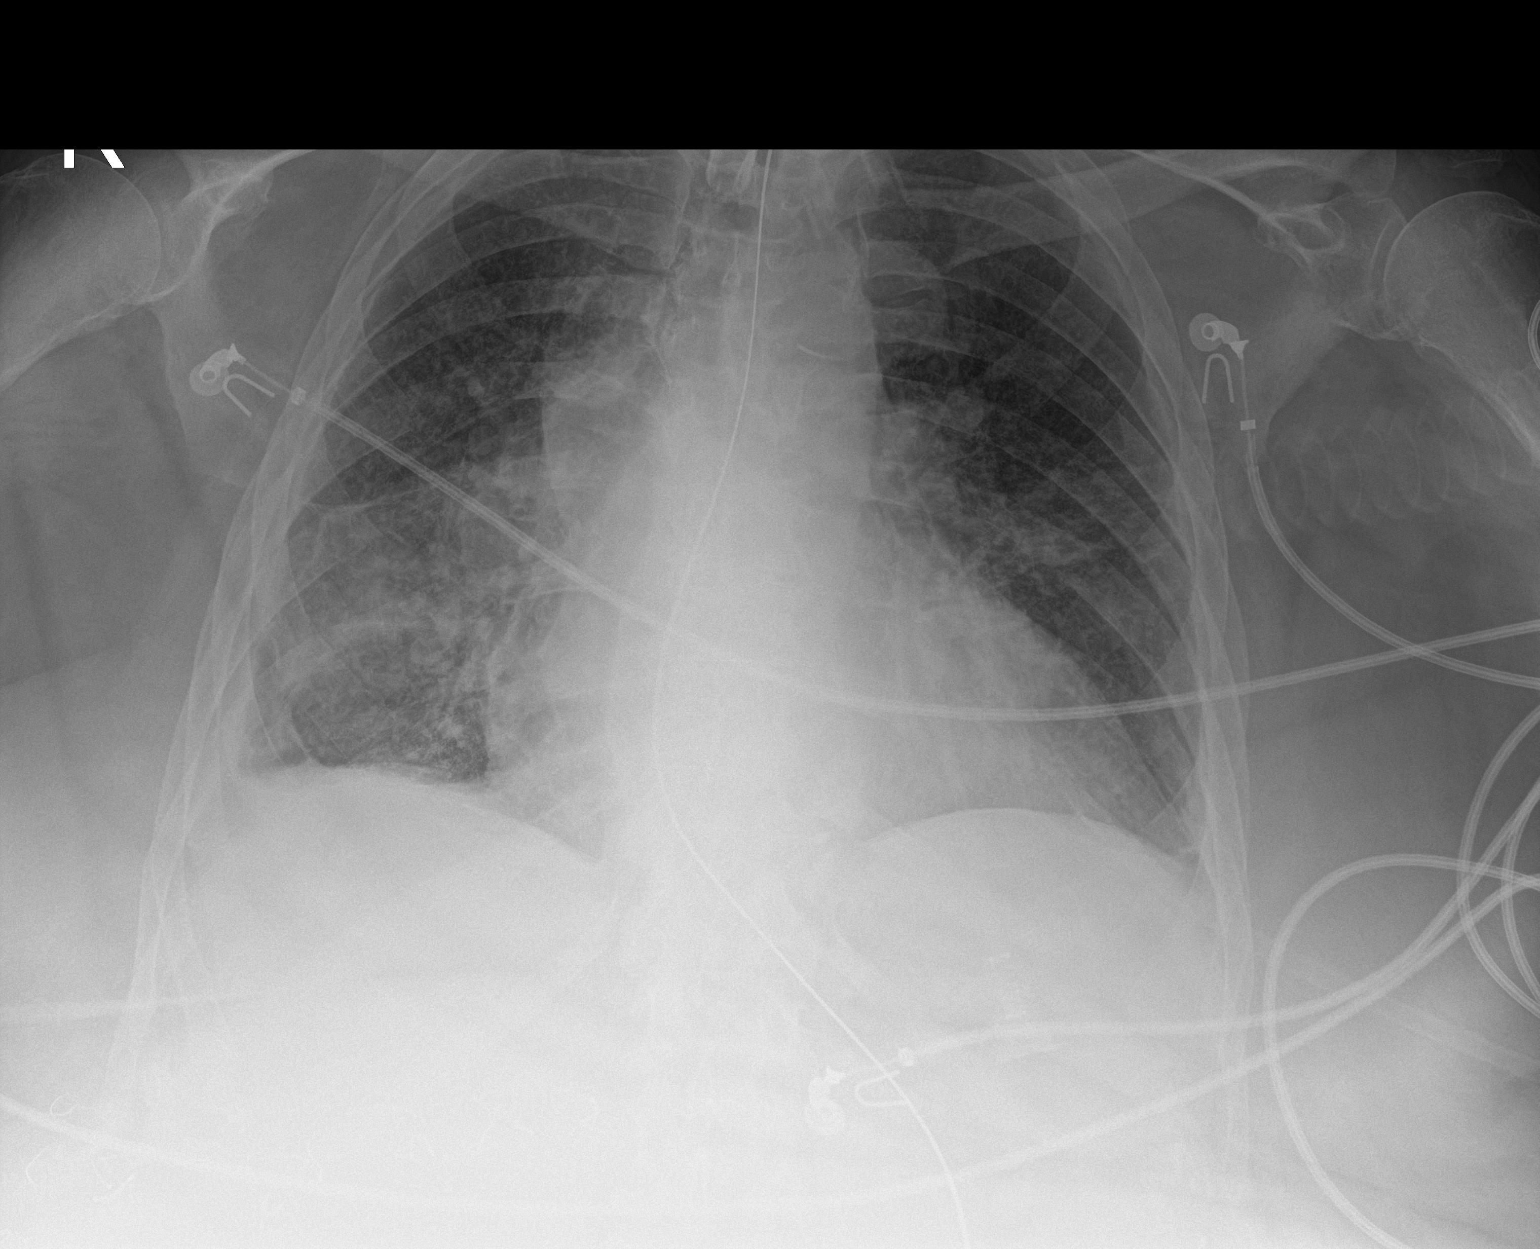

[1 of 1 positions shown; findings below may reference images not displayed]

FINDINGS: Interval placement of a tracheostomy tube with the tip at the
thoracic inlet. Unchanged enteric tube with the tip below the field
of view. Interval removal of the left upper extremity PICC line.

Stable cardiomediastinal silhouette. Normal pulmonary vascularity.
Improved bilateral streaky opacities with some residual right
perihilar and basilar opacity. Unchanged small right pleural
effusion. No pneumothorax. No acute osseous abnormality.
IMPRESSION: 1. Improved aeration bilaterally with residual right perihilar and
basilar atelectasis.
2. Unchanged small right pleural effusion.

## 2021-04-17 IMAGING — DX PORTABLE CHEST - 1 VIEW
1 series · 1 of 1 positions shown · non-contrast
Comparison: Radiograph November 09, 2018.

CLINICAL DATA: Pneumonia.

EXAM:
PORTABLE CHEST 1 VIEW

[chest ap]
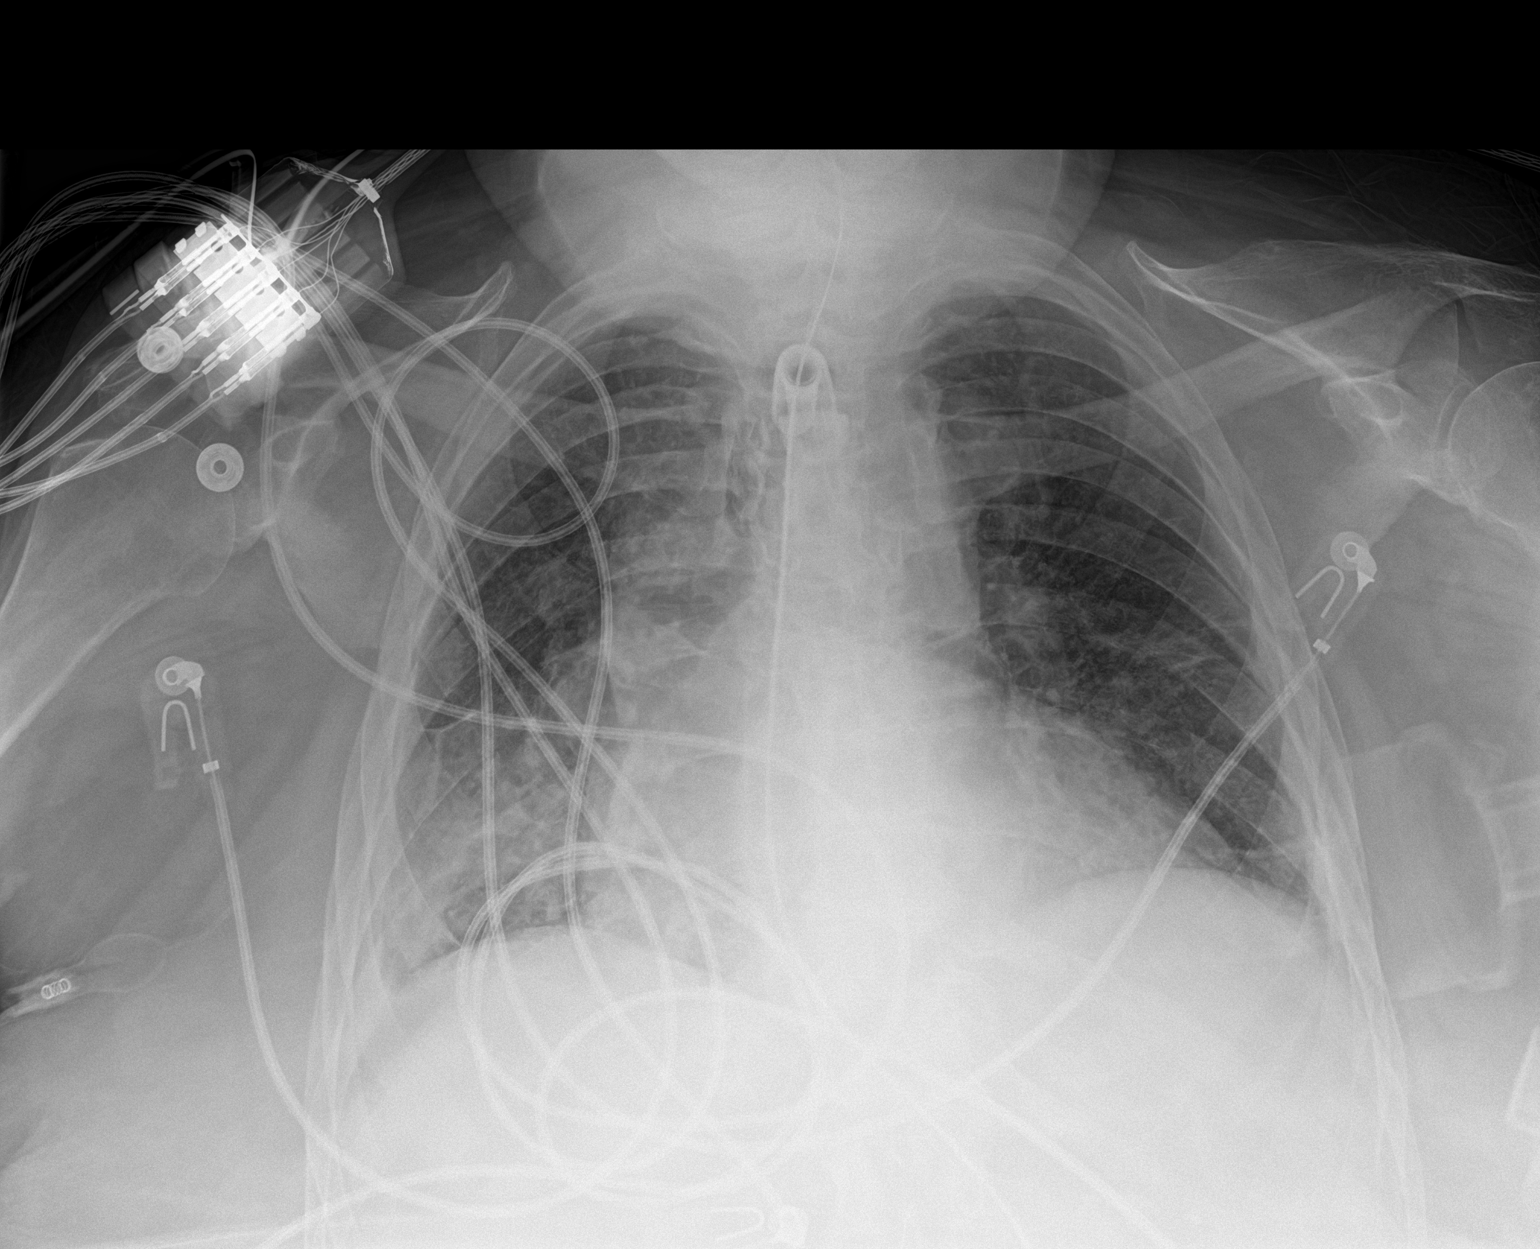

[1 of 1 positions shown; findings below may reference images not displayed]

FINDINGS: Stable cardiomegaly. Tracheostomy and enteric tubes are unchanged in
position. No pneumothorax is noted. Minimal left basilar
subsegmental atelectasis is noted. Stable right basilar opacity is
noted concerning for pneumonia or edema. Minimal right pleural
effusion is noted. Bony thorax is unremarkable.
IMPRESSION: Stable support apparatus. Minimal left basilar subsegmental
atelectasis. Stable right basilar edema or infiltrate is noted with
minimal right pleural effusion.
